# Patient Record
Sex: Female | Born: 1945 | Race: White | Hispanic: No | Marital: Married | Smoking: Never smoker
Health system: Southern US, Community
[De-identification: ages and names within clinical notes are randomized; demographics above are authoritative.]

## PROBLEM LIST (undated history)

## (undated) DIAGNOSIS — B019 Varicella without complication: Secondary | ICD-10-CM

## (undated) DIAGNOSIS — K635 Polyp of colon: Secondary | ICD-10-CM

## (undated) DIAGNOSIS — E039 Hypothyroidism, unspecified: Secondary | ICD-10-CM

## (undated) DIAGNOSIS — M810 Age-related osteoporosis without current pathological fracture: Secondary | ICD-10-CM

## (undated) DIAGNOSIS — F32A Depression, unspecified: Secondary | ICD-10-CM

## (undated) DIAGNOSIS — F329 Major depressive disorder, single episode, unspecified: Secondary | ICD-10-CM

## (undated) DIAGNOSIS — N390 Urinary tract infection, site not specified: Secondary | ICD-10-CM

## (undated) DIAGNOSIS — J45909 Unspecified asthma, uncomplicated: Secondary | ICD-10-CM

## (undated) DIAGNOSIS — Z9109 Other allergy status, other than to drugs and biological substances: Secondary | ICD-10-CM

## (undated) DIAGNOSIS — M1990 Unspecified osteoarthritis, unspecified site: Secondary | ICD-10-CM

## (undated) HISTORY — DX: Major depressive disorder, single episode, unspecified: F32.9

## (undated) HISTORY — DX: Hypothyroidism, unspecified: E03.9

## (undated) HISTORY — DX: Unspecified osteoarthritis, unspecified site: M19.90

## (undated) HISTORY — PX: TUBAL LIGATION: SHX77

## (undated) HISTORY — DX: Varicella without complication: B01.9

## (undated) HISTORY — DX: Depression, unspecified: F32.A

## (undated) HISTORY — DX: Urinary tract infection, site not specified: N39.0

## (undated) HISTORY — PX: LAMINECTOMY: SHX219

## (undated) HISTORY — DX: Polyp of colon: K63.5

---

## 2011-07-11 DIAGNOSIS — J019 Acute sinusitis, unspecified: Secondary | ICD-10-CM | POA: Diagnosis not present

## 2011-07-16 DIAGNOSIS — J301 Allergic rhinitis due to pollen: Secondary | ICD-10-CM | POA: Diagnosis not present

## 2011-07-18 DIAGNOSIS — J329 Chronic sinusitis, unspecified: Secondary | ICD-10-CM | POA: Diagnosis not present

## 2011-07-18 DIAGNOSIS — J309 Allergic rhinitis, unspecified: Secondary | ICD-10-CM | POA: Diagnosis not present

## 2011-07-23 DIAGNOSIS — J301 Allergic rhinitis due to pollen: Secondary | ICD-10-CM | POA: Diagnosis not present

## 2011-07-29 DIAGNOSIS — J329 Chronic sinusitis, unspecified: Secondary | ICD-10-CM | POA: Diagnosis not present

## 2011-07-29 DIAGNOSIS — J309 Allergic rhinitis, unspecified: Secondary | ICD-10-CM | POA: Diagnosis not present

## 2011-08-15 DIAGNOSIS — J309 Allergic rhinitis, unspecified: Secondary | ICD-10-CM | POA: Diagnosis not present

## 2011-08-29 ENCOUNTER — Ambulatory Visit
Admission: RE | Admit: 2011-08-29 | Discharge: 2011-08-29 | Disposition: A | Discharge status: Home / Self Care | Payer: Medicare Other | Source: Ambulatory Visit | Attending: Allergy and Immunology | Admitting: Allergy and Immunology

## 2011-08-29 ENCOUNTER — Other Ambulatory Visit: Payer: Self-pay | Admitting: Allergy and Immunology

## 2011-08-29 DIAGNOSIS — J3489 Other specified disorders of nose and nasal sinuses: Secondary | ICD-10-CM | POA: Diagnosis not present

## 2011-08-29 DIAGNOSIS — J301 Allergic rhinitis due to pollen: Secondary | ICD-10-CM | POA: Diagnosis not present

## 2011-08-29 DIAGNOSIS — J329 Chronic sinusitis, unspecified: Secondary | ICD-10-CM | POA: Diagnosis not present

## 2011-08-29 DIAGNOSIS — J019 Acute sinusitis, unspecified: Secondary | ICD-10-CM

## 2011-08-29 DIAGNOSIS — J3089 Other allergic rhinitis: Secondary | ICD-10-CM | POA: Diagnosis not present

## 2011-08-29 DIAGNOSIS — J3081 Allergic rhinitis due to animal (cat) (dog) hair and dander: Secondary | ICD-10-CM | POA: Diagnosis not present

## 2011-08-29 DIAGNOSIS — J45909 Unspecified asthma, uncomplicated: Secondary | ICD-10-CM | POA: Diagnosis not present

## 2011-09-01 DIAGNOSIS — J309 Allergic rhinitis, unspecified: Secondary | ICD-10-CM | POA: Diagnosis not present

## 2011-09-08 DIAGNOSIS — J019 Acute sinusitis, unspecified: Secondary | ICD-10-CM | POA: Diagnosis not present

## 2011-09-08 DIAGNOSIS — J45909 Unspecified asthma, uncomplicated: Secondary | ICD-10-CM | POA: Diagnosis not present

## 2011-09-08 DIAGNOSIS — J3089 Other allergic rhinitis: Secondary | ICD-10-CM | POA: Diagnosis not present

## 2011-09-08 DIAGNOSIS — J3081 Allergic rhinitis due to animal (cat) (dog) hair and dander: Secondary | ICD-10-CM | POA: Diagnosis not present

## 2011-09-08 DIAGNOSIS — J301 Allergic rhinitis due to pollen: Secondary | ICD-10-CM | POA: Diagnosis not present

## 2011-09-24 DIAGNOSIS — J309 Allergic rhinitis, unspecified: Secondary | ICD-10-CM | POA: Diagnosis not present

## 2011-09-26 DIAGNOSIS — J309 Allergic rhinitis, unspecified: Secondary | ICD-10-CM | POA: Diagnosis not present

## 2011-10-06 DIAGNOSIS — J309 Allergic rhinitis, unspecified: Secondary | ICD-10-CM | POA: Diagnosis not present

## 2011-10-08 DIAGNOSIS — J309 Allergic rhinitis, unspecified: Secondary | ICD-10-CM | POA: Diagnosis not present

## 2011-10-14 DIAGNOSIS — J309 Allergic rhinitis, unspecified: Secondary | ICD-10-CM | POA: Diagnosis not present

## 2011-10-17 DIAGNOSIS — J309 Allergic rhinitis, unspecified: Secondary | ICD-10-CM | POA: Diagnosis not present

## 2011-10-21 DIAGNOSIS — J309 Allergic rhinitis, unspecified: Secondary | ICD-10-CM | POA: Diagnosis not present

## 2011-10-24 DIAGNOSIS — J309 Allergic rhinitis, unspecified: Secondary | ICD-10-CM | POA: Diagnosis not present

## 2011-10-28 DIAGNOSIS — J309 Allergic rhinitis, unspecified: Secondary | ICD-10-CM | POA: Diagnosis not present

## 2011-10-31 DIAGNOSIS — J309 Allergic rhinitis, unspecified: Secondary | ICD-10-CM | POA: Diagnosis not present

## 2011-11-05 DIAGNOSIS — J309 Allergic rhinitis, unspecified: Secondary | ICD-10-CM | POA: Diagnosis not present

## 2011-11-07 DIAGNOSIS — J309 Allergic rhinitis, unspecified: Secondary | ICD-10-CM | POA: Diagnosis not present

## 2011-11-11 DIAGNOSIS — R6884 Jaw pain: Secondary | ICD-10-CM | POA: Diagnosis not present

## 2011-11-12 DIAGNOSIS — J309 Allergic rhinitis, unspecified: Secondary | ICD-10-CM | POA: Diagnosis not present

## 2011-11-14 DIAGNOSIS — J309 Allergic rhinitis, unspecified: Secondary | ICD-10-CM | POA: Diagnosis not present

## 2011-11-19 DIAGNOSIS — J309 Allergic rhinitis, unspecified: Secondary | ICD-10-CM | POA: Diagnosis not present

## 2011-11-21 DIAGNOSIS — J309 Allergic rhinitis, unspecified: Secondary | ICD-10-CM | POA: Diagnosis not present

## 2011-11-25 DIAGNOSIS — J309 Allergic rhinitis, unspecified: Secondary | ICD-10-CM | POA: Diagnosis not present

## 2011-11-28 DIAGNOSIS — Z136 Encounter for screening for cardiovascular disorders: Secondary | ICD-10-CM | POA: Diagnosis not present

## 2011-11-28 DIAGNOSIS — J309 Allergic rhinitis, unspecified: Secondary | ICD-10-CM | POA: Diagnosis not present

## 2011-12-03 DIAGNOSIS — J309 Allergic rhinitis, unspecified: Secondary | ICD-10-CM | POA: Diagnosis not present

## 2011-12-05 DIAGNOSIS — J309 Allergic rhinitis, unspecified: Secondary | ICD-10-CM | POA: Diagnosis not present

## 2011-12-09 ENCOUNTER — Other Ambulatory Visit: Payer: Self-pay | Admitting: Family Medicine

## 2011-12-09 DIAGNOSIS — Z0000 Encounter for general adult medical examination without abnormal findings: Secondary | ICD-10-CM | POA: Diagnosis not present

## 2011-12-09 DIAGNOSIS — M810 Age-related osteoporosis without current pathological fracture: Secondary | ICD-10-CM

## 2011-12-09 DIAGNOSIS — Z1231 Encounter for screening mammogram for malignant neoplasm of breast: Secondary | ICD-10-CM

## 2011-12-09 DIAGNOSIS — J309 Allergic rhinitis, unspecified: Secondary | ICD-10-CM | POA: Diagnosis not present

## 2011-12-12 DIAGNOSIS — J309 Allergic rhinitis, unspecified: Secondary | ICD-10-CM | POA: Diagnosis not present

## 2011-12-16 DIAGNOSIS — J309 Allergic rhinitis, unspecified: Secondary | ICD-10-CM | POA: Diagnosis not present

## 2011-12-17 ENCOUNTER — Other Ambulatory Visit (HOSPITAL_COMMUNITY)
Admission: RE | Admit: 2011-12-17 | Discharge: 2011-12-17 | Disposition: A | Discharge status: Home / Self Care | Payer: Medicare Other | Source: Ambulatory Visit | Attending: Family Medicine | Admitting: Family Medicine

## 2011-12-17 DIAGNOSIS — Z124 Encounter for screening for malignant neoplasm of cervix: Secondary | ICD-10-CM | POA: Diagnosis not present

## 2011-12-17 DIAGNOSIS — Z1159 Encounter for screening for other viral diseases: Secondary | ICD-10-CM | POA: Diagnosis not present

## 2011-12-17 DIAGNOSIS — N952 Postmenopausal atrophic vaginitis: Secondary | ICD-10-CM | POA: Diagnosis not present

## 2011-12-23 DIAGNOSIS — J309 Allergic rhinitis, unspecified: Secondary | ICD-10-CM | POA: Diagnosis not present

## 2011-12-30 DIAGNOSIS — J309 Allergic rhinitis, unspecified: Secondary | ICD-10-CM | POA: Diagnosis not present

## 2011-12-31 DIAGNOSIS — J309 Allergic rhinitis, unspecified: Secondary | ICD-10-CM | POA: Diagnosis not present

## 2012-01-06 DIAGNOSIS — J309 Allergic rhinitis, unspecified: Secondary | ICD-10-CM | POA: Diagnosis not present

## 2012-01-13 DIAGNOSIS — J309 Allergic rhinitis, unspecified: Secondary | ICD-10-CM | POA: Diagnosis not present

## 2012-01-20 DIAGNOSIS — J309 Allergic rhinitis, unspecified: Secondary | ICD-10-CM | POA: Diagnosis not present

## 2012-01-30 DIAGNOSIS — J309 Allergic rhinitis, unspecified: Secondary | ICD-10-CM | POA: Diagnosis not present

## 2012-02-05 DIAGNOSIS — J309 Allergic rhinitis, unspecified: Secondary | ICD-10-CM | POA: Diagnosis not present

## 2012-02-09 DIAGNOSIS — J309 Allergic rhinitis, unspecified: Secondary | ICD-10-CM | POA: Diagnosis not present

## 2012-02-11 ENCOUNTER — Other Ambulatory Visit: Payer: Medicare Other

## 2012-02-11 ENCOUNTER — Ambulatory Visit: Payer: Medicare Other

## 2012-02-11 DIAGNOSIS — J309 Allergic rhinitis, unspecified: Secondary | ICD-10-CM | POA: Diagnosis not present

## 2012-02-17 DIAGNOSIS — J309 Allergic rhinitis, unspecified: Secondary | ICD-10-CM | POA: Diagnosis not present

## 2012-02-18 DIAGNOSIS — Z1211 Encounter for screening for malignant neoplasm of colon: Secondary | ICD-10-CM | POA: Diagnosis not present

## 2012-02-18 DIAGNOSIS — D126 Benign neoplasm of colon, unspecified: Secondary | ICD-10-CM | POA: Diagnosis not present

## 2012-02-20 DIAGNOSIS — J309 Allergic rhinitis, unspecified: Secondary | ICD-10-CM | POA: Diagnosis not present

## 2012-02-23 DIAGNOSIS — J019 Acute sinusitis, unspecified: Secondary | ICD-10-CM | POA: Diagnosis not present

## 2012-02-25 ENCOUNTER — Ambulatory Visit
Admission: RE | Admit: 2012-02-25 | Discharge: 2012-02-25 | Disposition: A | Discharge status: Home / Self Care | Payer: Medicare Other | Source: Ambulatory Visit | Attending: Family Medicine | Admitting: Family Medicine

## 2012-02-25 DIAGNOSIS — Z780 Asymptomatic menopausal state: Secondary | ICD-10-CM | POA: Diagnosis not present

## 2012-02-25 DIAGNOSIS — Z1231 Encounter for screening mammogram for malignant neoplasm of breast: Secondary | ICD-10-CM

## 2012-02-25 DIAGNOSIS — M810 Age-related osteoporosis without current pathological fracture: Secondary | ICD-10-CM | POA: Diagnosis not present

## 2012-03-01 DIAGNOSIS — J309 Allergic rhinitis, unspecified: Secondary | ICD-10-CM | POA: Diagnosis not present

## 2012-03-05 DIAGNOSIS — J309 Allergic rhinitis, unspecified: Secondary | ICD-10-CM | POA: Diagnosis not present

## 2012-03-09 DIAGNOSIS — J309 Allergic rhinitis, unspecified: Secondary | ICD-10-CM | POA: Diagnosis not present

## 2012-03-15 DIAGNOSIS — J019 Acute sinusitis, unspecified: Secondary | ICD-10-CM | POA: Diagnosis not present

## 2012-03-15 DIAGNOSIS — J3081 Allergic rhinitis due to animal (cat) (dog) hair and dander: Secondary | ICD-10-CM | POA: Diagnosis not present

## 2012-03-15 DIAGNOSIS — J45909 Unspecified asthma, uncomplicated: Secondary | ICD-10-CM | POA: Diagnosis not present

## 2012-03-15 DIAGNOSIS — J3089 Other allergic rhinitis: Secondary | ICD-10-CM | POA: Diagnosis not present

## 2012-03-15 DIAGNOSIS — J301 Allergic rhinitis due to pollen: Secondary | ICD-10-CM | POA: Diagnosis not present

## 2012-03-23 DIAGNOSIS — J309 Allergic rhinitis, unspecified: Secondary | ICD-10-CM | POA: Diagnosis not present

## 2012-03-31 DIAGNOSIS — J309 Allergic rhinitis, unspecified: Secondary | ICD-10-CM | POA: Diagnosis not present

## 2012-04-07 DIAGNOSIS — J309 Allergic rhinitis, unspecified: Secondary | ICD-10-CM | POA: Diagnosis not present

## 2012-04-13 DIAGNOSIS — L03019 Cellulitis of unspecified finger: Secondary | ICD-10-CM | POA: Diagnosis not present

## 2012-04-13 DIAGNOSIS — Z23 Encounter for immunization: Secondary | ICD-10-CM | POA: Diagnosis not present

## 2012-04-19 DIAGNOSIS — J069 Acute upper respiratory infection, unspecified: Secondary | ICD-10-CM | POA: Diagnosis not present

## 2012-04-19 DIAGNOSIS — J301 Allergic rhinitis due to pollen: Secondary | ICD-10-CM | POA: Diagnosis not present

## 2012-04-19 DIAGNOSIS — J3081 Allergic rhinitis due to animal (cat) (dog) hair and dander: Secondary | ICD-10-CM | POA: Diagnosis not present

## 2012-04-19 DIAGNOSIS — J3089 Other allergic rhinitis: Secondary | ICD-10-CM | POA: Diagnosis not present

## 2012-04-19 DIAGNOSIS — J45909 Unspecified asthma, uncomplicated: Secondary | ICD-10-CM | POA: Diagnosis not present

## 2012-04-27 DIAGNOSIS — J309 Allergic rhinitis, unspecified: Secondary | ICD-10-CM | POA: Diagnosis not present

## 2012-05-05 DIAGNOSIS — J309 Allergic rhinitis, unspecified: Secondary | ICD-10-CM | POA: Diagnosis not present

## 2012-05-12 DIAGNOSIS — J309 Allergic rhinitis, unspecified: Secondary | ICD-10-CM | POA: Diagnosis not present

## 2012-05-19 DIAGNOSIS — J309 Allergic rhinitis, unspecified: Secondary | ICD-10-CM | POA: Diagnosis not present

## 2012-05-20 DIAGNOSIS — J309 Allergic rhinitis, unspecified: Secondary | ICD-10-CM | POA: Diagnosis not present

## 2012-05-26 DIAGNOSIS — J309 Allergic rhinitis, unspecified: Secondary | ICD-10-CM | POA: Diagnosis not present

## 2012-06-02 DIAGNOSIS — J309 Allergic rhinitis, unspecified: Secondary | ICD-10-CM | POA: Diagnosis not present

## 2012-06-09 DIAGNOSIS — J309 Allergic rhinitis, unspecified: Secondary | ICD-10-CM | POA: Diagnosis not present

## 2012-06-09 DIAGNOSIS — E039 Hypothyroidism, unspecified: Secondary | ICD-10-CM | POA: Diagnosis not present

## 2012-06-09 DIAGNOSIS — M810 Age-related osteoporosis without current pathological fracture: Secondary | ICD-10-CM | POA: Diagnosis not present

## 2012-06-09 DIAGNOSIS — K1370 Unspecified lesions of oral mucosa: Secondary | ICD-10-CM | POA: Diagnosis not present

## 2012-06-09 DIAGNOSIS — F329 Major depressive disorder, single episode, unspecified: Secondary | ICD-10-CM | POA: Diagnosis not present

## 2012-06-16 DIAGNOSIS — J309 Allergic rhinitis, unspecified: Secondary | ICD-10-CM | POA: Diagnosis not present

## 2012-06-21 DIAGNOSIS — J309 Allergic rhinitis, unspecified: Secondary | ICD-10-CM | POA: Diagnosis not present

## 2012-06-24 DIAGNOSIS — M810 Age-related osteoporosis without current pathological fracture: Secondary | ICD-10-CM | POA: Diagnosis not present

## 2012-06-24 DIAGNOSIS — J309 Allergic rhinitis, unspecified: Secondary | ICD-10-CM | POA: Diagnosis not present

## 2012-06-28 DIAGNOSIS — J309 Allergic rhinitis, unspecified: Secondary | ICD-10-CM | POA: Diagnosis not present

## 2012-07-01 DIAGNOSIS — J309 Allergic rhinitis, unspecified: Secondary | ICD-10-CM | POA: Diagnosis not present

## 2012-07-05 DIAGNOSIS — J309 Allergic rhinitis, unspecified: Secondary | ICD-10-CM | POA: Diagnosis not present

## 2012-07-12 DIAGNOSIS — J3081 Allergic rhinitis due to animal (cat) (dog) hair and dander: Secondary | ICD-10-CM | POA: Diagnosis not present

## 2012-07-12 DIAGNOSIS — J309 Allergic rhinitis, unspecified: Secondary | ICD-10-CM | POA: Diagnosis not present

## 2012-07-12 DIAGNOSIS — J301 Allergic rhinitis due to pollen: Secondary | ICD-10-CM | POA: Diagnosis not present

## 2012-07-12 DIAGNOSIS — J3089 Other allergic rhinitis: Secondary | ICD-10-CM | POA: Diagnosis not present

## 2012-07-12 DIAGNOSIS — J45909 Unspecified asthma, uncomplicated: Secondary | ICD-10-CM | POA: Diagnosis not present

## 2012-07-14 ENCOUNTER — Encounter (HOSPITAL_COMMUNITY): Payer: Medicare Other

## 2012-07-14 ENCOUNTER — Other Ambulatory Visit (HOSPITAL_COMMUNITY): Payer: Self-pay | Admitting: Family Medicine

## 2012-07-15 DIAGNOSIS — M545 Low back pain: Secondary | ICD-10-CM | POA: Diagnosis not present

## 2012-07-16 ENCOUNTER — Encounter (HOSPITAL_COMMUNITY): Payer: Self-pay

## 2012-07-16 ENCOUNTER — Encounter (HOSPITAL_COMMUNITY)
Admission: RE | Admit: 2012-07-16 | Discharge: 2012-07-16 | Disposition: A | Discharge status: Home / Self Care | Payer: Medicare Other | Source: Ambulatory Visit | Attending: Family Medicine | Admitting: Family Medicine

## 2012-07-16 DIAGNOSIS — M810 Age-related osteoporosis without current pathological fracture: Secondary | ICD-10-CM | POA: Diagnosis not present

## 2012-07-16 HISTORY — DX: Age-related osteoporosis without current pathological fracture: M81.0

## 2012-07-16 HISTORY — DX: Unspecified asthma, uncomplicated: J45.909

## 2012-07-16 HISTORY — DX: Other allergy status, other than to drugs and biological substances: Z91.09

## 2012-07-16 MED ORDER — ZOLEDRONIC ACID 5 MG/100ML IV SOLN
5.0000 mg | Freq: Once | INTRAVENOUS | Status: AC
  Administered 2012-07-16: 5 mg via INTRAVENOUS
  Filled 2012-07-16: qty 100

## 2012-07-16 MED ORDER — SODIUM CHLORIDE 0.9 % IV SOLN
INTRAVENOUS | Status: AC
  Administered 2012-07-16: 17:00:00 via INTRAVENOUS

## 2012-07-19 DIAGNOSIS — J309 Allergic rhinitis, unspecified: Secondary | ICD-10-CM | POA: Diagnosis not present

## 2012-07-19 DIAGNOSIS — H2510 Age-related nuclear cataract, unspecified eye: Secondary | ICD-10-CM | POA: Diagnosis not present

## 2012-07-19 DIAGNOSIS — H52209 Unspecified astigmatism, unspecified eye: Secondary | ICD-10-CM | POA: Diagnosis not present

## 2012-07-19 DIAGNOSIS — H5210 Myopia, unspecified eye: Secondary | ICD-10-CM | POA: Diagnosis not present

## 2012-07-26 DIAGNOSIS — J309 Allergic rhinitis, unspecified: Secondary | ICD-10-CM | POA: Diagnosis not present

## 2012-08-02 DIAGNOSIS — J309 Allergic rhinitis, unspecified: Secondary | ICD-10-CM | POA: Diagnosis not present

## 2012-08-05 DIAGNOSIS — J309 Allergic rhinitis, unspecified: Secondary | ICD-10-CM | POA: Diagnosis not present

## 2012-08-09 DIAGNOSIS — J309 Allergic rhinitis, unspecified: Secondary | ICD-10-CM | POA: Diagnosis not present

## 2012-08-10 DIAGNOSIS — H52209 Unspecified astigmatism, unspecified eye: Secondary | ICD-10-CM | POA: Diagnosis not present

## 2012-08-10 DIAGNOSIS — H2510 Age-related nuclear cataract, unspecified eye: Secondary | ICD-10-CM | POA: Diagnosis not present

## 2012-08-10 DIAGNOSIS — H43819 Vitreous degeneration, unspecified eye: Secondary | ICD-10-CM | POA: Diagnosis not present

## 2012-08-12 DIAGNOSIS — J309 Allergic rhinitis, unspecified: Secondary | ICD-10-CM | POA: Diagnosis not present

## 2012-08-16 DIAGNOSIS — J309 Allergic rhinitis, unspecified: Secondary | ICD-10-CM | POA: Diagnosis not present

## 2012-08-23 DIAGNOSIS — J309 Allergic rhinitis, unspecified: Secondary | ICD-10-CM | POA: Diagnosis not present

## 2012-08-31 DIAGNOSIS — J309 Allergic rhinitis, unspecified: Secondary | ICD-10-CM | POA: Diagnosis not present

## 2012-09-02 DIAGNOSIS — S298XXA Other specified injuries of thorax, initial encounter: Secondary | ICD-10-CM | POA: Diagnosis not present

## 2012-09-03 ENCOUNTER — Emergency Department (HOSPITAL_COMMUNITY)
Admission: EM | Admit: 2012-09-03 | Discharge: 2012-09-03 | Disposition: A | Discharge status: Home / Self Care | Payer: Medicare Other | Attending: Emergency Medicine | Admitting: Emergency Medicine

## 2012-09-03 ENCOUNTER — Emergency Department (HOSPITAL_COMMUNITY): Payer: Medicare Other

## 2012-09-03 ENCOUNTER — Encounter (HOSPITAL_COMMUNITY): Payer: Self-pay | Admitting: Emergency Medicine

## 2012-09-03 DIAGNOSIS — J45909 Unspecified asthma, uncomplicated: Secondary | ICD-10-CM | POA: Diagnosis not present

## 2012-09-03 DIAGNOSIS — R55 Syncope and collapse: Secondary | ICD-10-CM | POA: Insufficient documentation

## 2012-09-03 DIAGNOSIS — Z79899 Other long term (current) drug therapy: Secondary | ICD-10-CM | POA: Diagnosis not present

## 2012-09-03 DIAGNOSIS — R0789 Other chest pain: Secondary | ICD-10-CM | POA: Insufficient documentation

## 2012-09-03 DIAGNOSIS — R0781 Pleurodynia: Secondary | ICD-10-CM

## 2012-09-03 DIAGNOSIS — R0602 Shortness of breath: Secondary | ICD-10-CM | POA: Insufficient documentation

## 2012-09-03 DIAGNOSIS — R42 Dizziness and giddiness: Secondary | ICD-10-CM | POA: Diagnosis not present

## 2012-09-03 DIAGNOSIS — IMO0002 Reserved for concepts with insufficient information to code with codable children: Secondary | ICD-10-CM | POA: Insufficient documentation

## 2012-09-03 DIAGNOSIS — M810 Age-related osteoporosis without current pathological fracture: Secondary | ICD-10-CM | POA: Diagnosis not present

## 2012-09-03 DIAGNOSIS — R079 Chest pain, unspecified: Secondary | ICD-10-CM | POA: Diagnosis not present

## 2012-09-03 LAB — BASIC METABOLIC PANEL
CO2: 22 mEq/L (ref 19–32)
Calcium: 9.5 mg/dL (ref 8.4–10.5)
GFR calc Af Amer: >90 mL/min (ref >90)
GFR calc non Af Amer: 84 mL/min — ABNORMAL LOW (ref >90)
Sodium: 139 mEq/L (ref 135–145)

## 2012-09-03 LAB — CBC
MCH: 31.3 pg (ref 26.0–34.0)
MCHC: 33.6 g/dL (ref 30.0–36.0)
Platelets: 276 10*3/uL (ref 150–400)
RBC: 4.35 MIL/uL (ref 3.87–5.11)

## 2012-09-03 MED ORDER — HYDROCODONE-ACETAMINOPHEN 5-325 MG PO TABS: 1.0000 | ORAL_TABLET | Freq: Four times a day (QID) | ORAL | Status: DC | PRN

## 2012-09-03 MED ORDER — SODIUM CHLORIDE 0.9 % IV BOLUS (SEPSIS): 2000.0000 mL | Freq: Once | INTRAVENOUS | Status: DC

## 2012-09-03 MED ORDER — HYDROMORPHONE HCL PF 1 MG/ML IJ SOLN
0.5000 mg | Freq: Once | INTRAMUSCULAR | Status: AC
  Administered 2012-09-03: 13:00:00 via INTRAVENOUS
  Filled 2012-09-03: qty 1

## 2012-09-03 NOTE — ED Provider Notes (Signed)
Medical screening examination/treatment/procedure(s) were conducted as a shared visit with non-physician practitioner(s) and myself.  I personally evaluated the patient during the encounter   Benny Lennert, MD 09/03/12 [phone]

## 2012-09-03 NOTE — ED Provider Notes (Signed)
History     CSN: [id number]  Arrival date & time 09/03/12  1115   First MD Initiated Contact with Patient 09/03/12 1130      Chief Complaint  Patient presents with  . left rib pain     (Consider location/radiation/quality/duration/timing/severity/associated sxs/prior treatment) Patient is a 67 y.o. female presenting with chest pain. The history is provided by the patient.  Chest Pain Pain location:  L lateral chest Pain quality: sharp   Radiates to: radiates around side  Pain severity:  Severe Onset quality:  Sudden Duration:  3 days Timing:  Constant Progression:  Worsening Chronicity:  New Context: trauma   Context: not at rest   Relieved by: rest. Worsened by:  Movement and deep breathing Associated symptoms: dizziness, near-syncope and shortness of breath   Associated symptoms: no abdominal pain and no fever   Shortness of breath:    Severity:  Moderate   Onset quality:  Sudden   Duration:  2 hours   Timing:  Constant   Progression:  Worsening Risk factors comment:  Osetoporosis, hx of rib fx  Patient was gardening Wednesday (2 days ago) when her shovel hit her in her left lateral chest wall. She felt ok, but was not seen. Yesterday she felt more pain and went to her PCP where her xray did not show a fracture. She went home with instructions to take motrin and ice it. She was compliant. This morning she woke up at 9am and was light headed with SOB. She went to her PCP again this morning. An xray and ecg were done. Both of which were negative. PCP sent her to ER by car with husband driving with instructions that she needed a CT.   Past Medical History  Diagnosis Date  . Osteoporosis   . Environmental allergies     allergies all year long  . Asthma     Past Surgical History  Procedure Laterality Date  . Tubal ligation      No family history on file.  History  Substance Use Topics  . Smoking status: Never Smoker   . Smokeless tobacco: Never Used  .  Alcohol Use: 4.2 oz/week    7 Glasses of wine per week     Comment: a glass of wine with dinner each night    OB History   Grav Para Term Preterm Abortions TAB SAB Ect Mult Living                  Review of Systems  Constitutional: Negative for fever.  Respiratory: Positive for shortness of breath.   Cardiovascular: Positive for chest pain and near-syncope.  Gastrointestinal: Negative for abdominal pain.  Neurological: Positive for dizziness.    Allergies  Augmentin and Bactrim  Home Medications   Current Outpatient Rx  Name  Route  Sig  Dispense  Refill  . albuterol (PROVENTIL HFA;VENTOLIN HFA) 108 (90 BASE) MCG/ACT inhaler   Inhalation   Inhale 2 puffs into the lungs every 6 (six) hours as needed for wheezing or shortness of breath.         . beclomethasone (QVAR) 40 MCG/ACT inhaler   Inhalation   Inhale 2 puffs into the lungs 2 (two) times daily.         Marland Kitchen buPROPion (WELLBUTRIN XL) 300 MG 24 hr tablet   Oral   Take 300 mg by mouth every morning.         . calcium-vitamin D (OSCAL-500) 500-400 MG-UNIT per tablet   Oral  Take 1 tablet by mouth 2 (two) times daily with a meal.         . cetirizine (ZYRTEC) 10 MG tablet   Oral   Take 10 mg by mouth every morning.         . diphenhydrAMINE (BENADRYL) 25 MG tablet   Oral   Take 25 mg by mouth every 6 (six) hours as needed for allergies.         Marland Kitchen docusate sodium (COLACE) 100 MG capsule   Oral   Take 100 mg by mouth at bedtime.         . fexofenadine (ALLER-EASE) 180 MG tablet   Oral   Take 180 mg by mouth every morning.         Marland Kitchen FLUoxetine (PROZAC) 40 MG capsule   Oral   Take 40 mg by mouth every morning.         Marland Kitchen ibuprofen (ADVIL,MOTRIN) 200 MG tablet   Oral   Take 200-800 mg by mouth every 6 (six) hours as needed for pain.         Marland Kitchen levothyroxine (SYNTHROID, LEVOTHROID) 125 MCG tablet   Oral   Take 125 mcg by mouth daily before breakfast.         . montelukast (SINGULAIR)  10 MG tablet   Oral   Take 10 mg by mouth at bedtime.         . Multiple Vitamin (MULTIVITAMIN WITH MINERALS) TABS   Oral   Take 1 tablet by mouth every morning.         Marland Kitchen olopatadine (PATANOL) 0.1 % ophthalmic solution   Both Eyes   Place 1 drop into both eyes 2 (two) times daily.           BP 136/71  Pulse 59  Temp(Src) 98.4 F (36.9 C) (Oral)  Resp 16  SpO2 100%  Physical Exam  Nursing note and vitals reviewed. Constitutional: She appears well-developed.  Non-toxic appearance.  Triage BP hypotensive - likely an error; BP 3 minutes later was normotensive  HENT:  Head: Normocephalic and atraumatic.  Right Ear: External ear normal.  Left Ear: External ear normal.  Nose: Nose normal.  Eyes: Conjunctivae, EOM and lids are normal.  Neck: Trachea normal and phonation normal. No tracheal deviation present.  Cardiovascular: Normal rate, regular rhythm, S1 normal, S2 normal, normal heart sounds, intact distal pulses and normal pulses.  Exam reveals no gallop and no friction rub.   No murmur heard. Pulmonary/Chest: Effort normal and breath sounds normal. No respiratory distress. She has no wheezes. She has no rales. She exhibits tenderness.  Tenderness over left lateral wall consistent with rib distrubution   Abdominal: Soft.  Musculoskeletal: Normal range of motion. She exhibits tenderness.  Neurological: She is alert.  Skin: Skin is warm and dry.  Psychiatric: She has a normal mood and affect. Her behavior is normal.    ED Course  Procedures (including critical care time)  Labs Reviewed  BASIC METABOLIC PANEL - Abnormal; Notable for the following:    Glucose, Bld 113 (*)    GFR calc non Af Amer 84 (*)    All other components within normal limits  CBC   Dg Chest 2 View  09/03/2012  *RADIOLOGY REPORT*  Clinical Data: Shortness of breath, chest pain  CHEST - 2 VIEW  Comparison: [facility] chest radiographs dated02/28/2014  Findings: Lungs are essentially  clear.  Mild linear scarring in the left upper lobe.  No pleural effusion  or pneumothorax.  Prior nodular opacities overlying the lower lungs are not visualized on the current study and likely corresponded to the patient's nipples.  The heart is normal in size.  Mild degenerative changes of the visualized thoracolumbar spine.  IMPRESSION: No evidence of acute cardiopulmonary disease.   Original Report Authenticated By: Charline Bills, M.D.     Date: 09/03/2012  Rate: 60  Rhythm: normal sinus rhythm  QRS Axis: left  Intervals: normal  ST/T Wave abnormalities: normal  Conduction Disutrbances:none  Narrative Interpretation:   Old EKG Reviewed: none available   1. Rib pain on left side   2. Chest pain, musculoskeletal       MDM  Patient is stable. Pain only with movement. At this point suspect rib fx that is not visible on chest xray. No positive risk factors for PE. No concern for cardiac causes of pain at this point. Patient encouraged to take deep breaths for prevention of atelectasis and pneumonia. Patient given pain medicine. Dr. Estell Harpin saw patient and agrees with plan. Patient voices understanding of the plan and agrees. Return instructions given.   Mora Bellman, PA-C 09/03/12 1529

## 2012-09-03 NOTE — ED Notes (Signed)
Per patient was digging hole to plant tree and handle of shovel jared her left side

## 2012-09-06 DIAGNOSIS — J309 Allergic rhinitis, unspecified: Secondary | ICD-10-CM | POA: Diagnosis not present

## 2012-09-09 DIAGNOSIS — H2510 Age-related nuclear cataract, unspecified eye: Secondary | ICD-10-CM | POA: Diagnosis not present

## 2012-09-09 DIAGNOSIS — H2589 Other age-related cataract: Secondary | ICD-10-CM | POA: Diagnosis not present

## 2012-09-14 DIAGNOSIS — J309 Allergic rhinitis, unspecified: Secondary | ICD-10-CM | POA: Diagnosis not present

## 2012-09-15 DIAGNOSIS — J3081 Allergic rhinitis due to animal (cat) (dog) hair and dander: Secondary | ICD-10-CM | POA: Diagnosis not present

## 2012-09-15 DIAGNOSIS — J301 Allergic rhinitis due to pollen: Secondary | ICD-10-CM | POA: Diagnosis not present

## 2012-09-15 DIAGNOSIS — J019 Acute sinusitis, unspecified: Secondary | ICD-10-CM | POA: Diagnosis not present

## 2012-09-15 DIAGNOSIS — J45909 Unspecified asthma, uncomplicated: Secondary | ICD-10-CM | POA: Diagnosis not present

## 2012-09-15 DIAGNOSIS — J3089 Other allergic rhinitis: Secondary | ICD-10-CM | POA: Diagnosis not present

## 2012-09-15 DIAGNOSIS — R05 Cough: Secondary | ICD-10-CM | POA: Diagnosis not present

## 2012-09-23 DIAGNOSIS — J309 Allergic rhinitis, unspecified: Secondary | ICD-10-CM | POA: Diagnosis not present

## 2012-09-30 DIAGNOSIS — J309 Allergic rhinitis, unspecified: Secondary | ICD-10-CM | POA: Diagnosis not present

## 2012-10-04 DIAGNOSIS — R05 Cough: Secondary | ICD-10-CM | POA: Diagnosis not present

## 2012-10-04 DIAGNOSIS — J301 Allergic rhinitis due to pollen: Secondary | ICD-10-CM | POA: Diagnosis not present

## 2012-10-04 DIAGNOSIS — J3081 Allergic rhinitis due to animal (cat) (dog) hair and dander: Secondary | ICD-10-CM | POA: Diagnosis not present

## 2012-10-04 DIAGNOSIS — J45909 Unspecified asthma, uncomplicated: Secondary | ICD-10-CM | POA: Diagnosis not present

## 2012-10-04 DIAGNOSIS — J3089 Other allergic rhinitis: Secondary | ICD-10-CM | POA: Diagnosis not present

## 2012-10-08 DIAGNOSIS — J309 Allergic rhinitis, unspecified: Secondary | ICD-10-CM | POA: Diagnosis not present

## 2012-10-15 DIAGNOSIS — J309 Allergic rhinitis, unspecified: Secondary | ICD-10-CM | POA: Diagnosis not present

## 2012-10-21 DIAGNOSIS — J309 Allergic rhinitis, unspecified: Secondary | ICD-10-CM | POA: Diagnosis not present

## 2012-10-25 DIAGNOSIS — J309 Allergic rhinitis, unspecified: Secondary | ICD-10-CM | POA: Diagnosis not present

## 2012-10-29 DIAGNOSIS — J309 Allergic rhinitis, unspecified: Secondary | ICD-10-CM | POA: Diagnosis not present

## 2012-11-04 DIAGNOSIS — J309 Allergic rhinitis, unspecified: Secondary | ICD-10-CM | POA: Diagnosis not present

## 2012-11-11 DIAGNOSIS — J309 Allergic rhinitis, unspecified: Secondary | ICD-10-CM | POA: Diagnosis not present

## 2012-11-19 DIAGNOSIS — J309 Allergic rhinitis, unspecified: Secondary | ICD-10-CM | POA: Diagnosis not present

## 2012-11-24 DIAGNOSIS — J309 Allergic rhinitis, unspecified: Secondary | ICD-10-CM | POA: Diagnosis not present

## 2012-11-25 DIAGNOSIS — J3089 Other allergic rhinitis: Secondary | ICD-10-CM | POA: Diagnosis not present

## 2012-11-25 DIAGNOSIS — J3081 Allergic rhinitis due to animal (cat) (dog) hair and dander: Secondary | ICD-10-CM | POA: Diagnosis not present

## 2012-11-25 DIAGNOSIS — J45909 Unspecified asthma, uncomplicated: Secondary | ICD-10-CM | POA: Diagnosis not present

## 2012-11-25 DIAGNOSIS — J301 Allergic rhinitis due to pollen: Secondary | ICD-10-CM | POA: Diagnosis not present

## 2012-11-25 DIAGNOSIS — J309 Allergic rhinitis, unspecified: Secondary | ICD-10-CM | POA: Diagnosis not present

## 2012-11-25 DIAGNOSIS — J449 Chronic obstructive pulmonary disease, unspecified: Secondary | ICD-10-CM | POA: Diagnosis not present

## 2012-12-03 DIAGNOSIS — J45909 Unspecified asthma, uncomplicated: Secondary | ICD-10-CM | POA: Diagnosis not present

## 2012-12-03 DIAGNOSIS — J449 Chronic obstructive pulmonary disease, unspecified: Secondary | ICD-10-CM | POA: Diagnosis not present

## 2012-12-03 DIAGNOSIS — J3089 Other allergic rhinitis: Secondary | ICD-10-CM | POA: Diagnosis not present

## 2012-12-03 DIAGNOSIS — J301 Allergic rhinitis due to pollen: Secondary | ICD-10-CM | POA: Diagnosis not present

## 2012-12-03 DIAGNOSIS — J3081 Allergic rhinitis due to animal (cat) (dog) hair and dander: Secondary | ICD-10-CM | POA: Diagnosis not present

## 2012-12-06 ENCOUNTER — Ambulatory Visit
Admission: RE | Admit: 2012-12-06 | Discharge: 2012-12-06 | Disposition: A | Discharge status: Home / Self Care | Payer: Medicare Other | Source: Ambulatory Visit | Attending: Allergy and Immunology | Admitting: Allergy and Immunology

## 2012-12-06 ENCOUNTER — Other Ambulatory Visit: Payer: Self-pay | Admitting: Allergy and Immunology

## 2012-12-06 DIAGNOSIS — R05 Cough: Secondary | ICD-10-CM | POA: Diagnosis not present

## 2012-12-06 DIAGNOSIS — J449 Chronic obstructive pulmonary disease, unspecified: Secondary | ICD-10-CM

## 2012-12-10 DIAGNOSIS — J189 Pneumonia, unspecified organism: Secondary | ICD-10-CM | POA: Diagnosis not present

## 2012-12-21 DIAGNOSIS — J309 Allergic rhinitis, unspecified: Secondary | ICD-10-CM | POA: Diagnosis not present

## 2012-12-31 DIAGNOSIS — J309 Allergic rhinitis, unspecified: Secondary | ICD-10-CM | POA: Diagnosis not present

## 2013-01-06 DIAGNOSIS — J301 Allergic rhinitis due to pollen: Secondary | ICD-10-CM | POA: Diagnosis not present

## 2013-01-06 DIAGNOSIS — J309 Allergic rhinitis, unspecified: Secondary | ICD-10-CM | POA: Diagnosis not present

## 2013-01-06 DIAGNOSIS — J3089 Other allergic rhinitis: Secondary | ICD-10-CM | POA: Diagnosis not present

## 2013-01-06 DIAGNOSIS — J3081 Allergic rhinitis due to animal (cat) (dog) hair and dander: Secondary | ICD-10-CM | POA: Diagnosis not present

## 2013-01-06 DIAGNOSIS — J180 Bronchopneumonia, unspecified organism: Secondary | ICD-10-CM | POA: Diagnosis not present

## 2013-01-06 DIAGNOSIS — J45909 Unspecified asthma, uncomplicated: Secondary | ICD-10-CM | POA: Diagnosis not present

## 2013-01-10 DIAGNOSIS — J189 Pneumonia, unspecified organism: Secondary | ICD-10-CM | POA: Diagnosis not present

## 2013-01-13 DIAGNOSIS — J309 Allergic rhinitis, unspecified: Secondary | ICD-10-CM | POA: Diagnosis not present

## 2013-01-19 DIAGNOSIS — Z23 Encounter for immunization: Secondary | ICD-10-CM | POA: Diagnosis not present

## 2013-01-21 DIAGNOSIS — J309 Allergic rhinitis, unspecified: Secondary | ICD-10-CM | POA: Diagnosis not present

## 2013-01-27 DIAGNOSIS — J309 Allergic rhinitis, unspecified: Secondary | ICD-10-CM | POA: Diagnosis not present

## 2013-02-04 DIAGNOSIS — J309 Allergic rhinitis, unspecified: Secondary | ICD-10-CM | POA: Diagnosis not present

## 2013-02-07 DIAGNOSIS — J309 Allergic rhinitis, unspecified: Secondary | ICD-10-CM | POA: Diagnosis not present

## 2013-02-09 DIAGNOSIS — J309 Allergic rhinitis, unspecified: Secondary | ICD-10-CM | POA: Diagnosis not present

## 2013-02-22 DIAGNOSIS — J309 Allergic rhinitis, unspecified: Secondary | ICD-10-CM | POA: Diagnosis not present

## 2013-02-25 DIAGNOSIS — J328 Other chronic sinusitis: Secondary | ICD-10-CM | POA: Diagnosis not present

## 2013-03-02 DIAGNOSIS — J309 Allergic rhinitis, unspecified: Secondary | ICD-10-CM | POA: Diagnosis not present

## 2013-03-09 DIAGNOSIS — J309 Allergic rhinitis, unspecified: Secondary | ICD-10-CM | POA: Diagnosis not present

## 2013-03-16 DIAGNOSIS — J309 Allergic rhinitis, unspecified: Secondary | ICD-10-CM | POA: Diagnosis not present

## 2013-03-24 DIAGNOSIS — J309 Allergic rhinitis, unspecified: Secondary | ICD-10-CM | POA: Diagnosis not present

## 2013-03-31 DIAGNOSIS — J309 Allergic rhinitis, unspecified: Secondary | ICD-10-CM | POA: Diagnosis not present

## 2013-04-08 DIAGNOSIS — J309 Allergic rhinitis, unspecified: Secondary | ICD-10-CM | POA: Diagnosis not present

## 2013-04-15 DIAGNOSIS — J309 Allergic rhinitis, unspecified: Secondary | ICD-10-CM | POA: Diagnosis not present

## 2013-04-22 DIAGNOSIS — J309 Allergic rhinitis, unspecified: Secondary | ICD-10-CM | POA: Diagnosis not present

## 2013-04-29 DIAGNOSIS — J309 Allergic rhinitis, unspecified: Secondary | ICD-10-CM | POA: Diagnosis not present

## 2013-05-02 DIAGNOSIS — J309 Allergic rhinitis, unspecified: Secondary | ICD-10-CM | POA: Diagnosis not present

## 2013-05-06 DIAGNOSIS — J3081 Allergic rhinitis due to animal (cat) (dog) hair and dander: Secondary | ICD-10-CM | POA: Diagnosis not present

## 2013-05-06 DIAGNOSIS — J3089 Other allergic rhinitis: Secondary | ICD-10-CM | POA: Diagnosis not present

## 2013-05-06 DIAGNOSIS — J209 Acute bronchitis, unspecified: Secondary | ICD-10-CM | POA: Diagnosis not present

## 2013-05-06 DIAGNOSIS — J301 Allergic rhinitis due to pollen: Secondary | ICD-10-CM | POA: Diagnosis not present

## 2013-05-06 DIAGNOSIS — J45909 Unspecified asthma, uncomplicated: Secondary | ICD-10-CM | POA: Diagnosis not present

## 2013-06-21 DIAGNOSIS — J309 Allergic rhinitis, unspecified: Secondary | ICD-10-CM | POA: Diagnosis not present

## 2013-06-23 DIAGNOSIS — J309 Allergic rhinitis, unspecified: Secondary | ICD-10-CM | POA: Diagnosis not present

## 2013-06-27 DIAGNOSIS — J309 Allergic rhinitis, unspecified: Secondary | ICD-10-CM | POA: Diagnosis not present

## 2013-07-05 DIAGNOSIS — J309 Allergic rhinitis, unspecified: Secondary | ICD-10-CM | POA: Diagnosis not present

## 2013-07-07 HISTORY — PX: LUMBAR FUSION: SHX111

## 2013-07-08 DIAGNOSIS — J309 Allergic rhinitis, unspecified: Secondary | ICD-10-CM | POA: Diagnosis not present

## 2013-07-12 DIAGNOSIS — Z23 Encounter for immunization: Secondary | ICD-10-CM | POA: Diagnosis not present

## 2013-07-13 DIAGNOSIS — H52209 Unspecified astigmatism, unspecified eye: Secondary | ICD-10-CM | POA: Diagnosis not present

## 2013-07-13 DIAGNOSIS — H2510 Age-related nuclear cataract, unspecified eye: Secondary | ICD-10-CM | POA: Diagnosis not present

## 2013-07-15 DIAGNOSIS — J309 Allergic rhinitis, unspecified: Secondary | ICD-10-CM | POA: Diagnosis not present

## 2013-07-22 DIAGNOSIS — J309 Allergic rhinitis, unspecified: Secondary | ICD-10-CM | POA: Diagnosis not present

## 2013-07-26 DIAGNOSIS — J309 Allergic rhinitis, unspecified: Secondary | ICD-10-CM | POA: Diagnosis not present

## 2013-07-29 DIAGNOSIS — J309 Allergic rhinitis, unspecified: Secondary | ICD-10-CM | POA: Diagnosis not present

## 2013-08-02 DIAGNOSIS — J309 Allergic rhinitis, unspecified: Secondary | ICD-10-CM | POA: Diagnosis not present

## 2013-08-09 DIAGNOSIS — J309 Allergic rhinitis, unspecified: Secondary | ICD-10-CM | POA: Diagnosis not present

## 2013-08-22 DIAGNOSIS — J45909 Unspecified asthma, uncomplicated: Secondary | ICD-10-CM | POA: Diagnosis not present

## 2013-08-22 DIAGNOSIS — J301 Allergic rhinitis due to pollen: Secondary | ICD-10-CM | POA: Diagnosis not present

## 2013-08-22 DIAGNOSIS — J309 Allergic rhinitis, unspecified: Secondary | ICD-10-CM | POA: Diagnosis not present

## 2013-08-22 DIAGNOSIS — J3081 Allergic rhinitis due to animal (cat) (dog) hair and dander: Secondary | ICD-10-CM | POA: Diagnosis not present

## 2013-08-22 DIAGNOSIS — J3089 Other allergic rhinitis: Secondary | ICD-10-CM | POA: Diagnosis not present

## 2013-08-29 DIAGNOSIS — J309 Allergic rhinitis, unspecified: Secondary | ICD-10-CM | POA: Diagnosis not present

## 2013-09-05 DIAGNOSIS — J309 Allergic rhinitis, unspecified: Secondary | ICD-10-CM | POA: Diagnosis not present

## 2013-09-06 DIAGNOSIS — Z0000 Encounter for general adult medical examination without abnormal findings: Secondary | ICD-10-CM | POA: Diagnosis not present

## 2013-09-06 DIAGNOSIS — H9190 Unspecified hearing loss, unspecified ear: Secondary | ICD-10-CM | POA: Diagnosis not present

## 2013-09-06 DIAGNOSIS — E039 Hypothyroidism, unspecified: Secondary | ICD-10-CM | POA: Diagnosis not present

## 2013-09-06 DIAGNOSIS — Z23 Encounter for immunization: Secondary | ICD-10-CM | POA: Diagnosis not present

## 2013-09-07 ENCOUNTER — Other Ambulatory Visit: Payer: Self-pay | Admitting: Family Medicine

## 2013-09-07 DIAGNOSIS — M810 Age-related osteoporosis without current pathological fracture: Secondary | ICD-10-CM

## 2013-09-12 DIAGNOSIS — J309 Allergic rhinitis, unspecified: Secondary | ICD-10-CM | POA: Diagnosis not present

## 2013-09-19 DIAGNOSIS — J309 Allergic rhinitis, unspecified: Secondary | ICD-10-CM | POA: Diagnosis not present

## 2013-09-20 ENCOUNTER — Other Ambulatory Visit: Payer: Self-pay | Admitting: Family Medicine

## 2013-09-20 DIAGNOSIS — Z1231 Encounter for screening mammogram for malignant neoplasm of breast: Secondary | ICD-10-CM

## 2013-09-26 DIAGNOSIS — J309 Allergic rhinitis, unspecified: Secondary | ICD-10-CM | POA: Diagnosis not present

## 2013-10-03 DIAGNOSIS — H903 Sensorineural hearing loss, bilateral: Secondary | ICD-10-CM | POA: Diagnosis not present

## 2013-10-03 DIAGNOSIS — J309 Allergic rhinitis, unspecified: Secondary | ICD-10-CM | POA: Diagnosis not present

## 2013-10-05 DIAGNOSIS — J301 Allergic rhinitis due to pollen: Secondary | ICD-10-CM | POA: Diagnosis not present

## 2013-10-05 DIAGNOSIS — J3081 Allergic rhinitis due to animal (cat) (dog) hair and dander: Secondary | ICD-10-CM | POA: Diagnosis not present

## 2013-10-05 DIAGNOSIS — J45909 Unspecified asthma, uncomplicated: Secondary | ICD-10-CM | POA: Diagnosis not present

## 2013-10-05 DIAGNOSIS — R059 Cough, unspecified: Secondary | ICD-10-CM | POA: Diagnosis not present

## 2013-10-05 DIAGNOSIS — R05 Cough: Secondary | ICD-10-CM | POA: Diagnosis not present

## 2013-10-05 DIAGNOSIS — J3089 Other allergic rhinitis: Secondary | ICD-10-CM | POA: Diagnosis not present

## 2013-10-11 DIAGNOSIS — J309 Allergic rhinitis, unspecified: Secondary | ICD-10-CM | POA: Diagnosis not present

## 2013-10-17 DIAGNOSIS — J309 Allergic rhinitis, unspecified: Secondary | ICD-10-CM | POA: Diagnosis not present

## 2013-10-19 DIAGNOSIS — J309 Allergic rhinitis, unspecified: Secondary | ICD-10-CM | POA: Diagnosis not present

## 2013-10-24 DIAGNOSIS — J309 Allergic rhinitis, unspecified: Secondary | ICD-10-CM | POA: Diagnosis not present

## 2013-10-31 DIAGNOSIS — J309 Allergic rhinitis, unspecified: Secondary | ICD-10-CM | POA: Diagnosis not present

## 2013-11-07 DIAGNOSIS — J309 Allergic rhinitis, unspecified: Secondary | ICD-10-CM | POA: Diagnosis not present

## 2013-11-10 ENCOUNTER — Other Ambulatory Visit: Payer: Self-pay | Admitting: Family Medicine

## 2013-11-10 DIAGNOSIS — R102 Pelvic and perineal pain: Secondary | ICD-10-CM

## 2013-11-10 DIAGNOSIS — S3210XA Unspecified fracture of sacrum, initial encounter for closed fracture: Secondary | ICD-10-CM

## 2013-11-10 DIAGNOSIS — M533 Sacrococcygeal disorders, not elsewhere classified: Secondary | ICD-10-CM

## 2013-11-10 DIAGNOSIS — R52 Pain, unspecified: Secondary | ICD-10-CM

## 2013-11-14 DIAGNOSIS — J309 Allergic rhinitis, unspecified: Secondary | ICD-10-CM | POA: Diagnosis not present

## 2013-11-16 ENCOUNTER — Ambulatory Visit
Admission: RE | Admit: 2013-11-16 | Discharge: 2013-11-16 | Disposition: A | Discharge status: Home / Self Care | Payer: Medicare Other | Source: Ambulatory Visit | Attending: Family Medicine | Admitting: Family Medicine

## 2013-11-16 DIAGNOSIS — M533 Sacrococcygeal disorders, not elsewhere classified: Secondary | ICD-10-CM | POA: Diagnosis not present

## 2013-11-18 DIAGNOSIS — J309 Allergic rhinitis, unspecified: Secondary | ICD-10-CM | POA: Diagnosis not present

## 2013-11-22 DIAGNOSIS — J309 Allergic rhinitis, unspecified: Secondary | ICD-10-CM | POA: Diagnosis not present

## 2013-11-23 DIAGNOSIS — J309 Allergic rhinitis, unspecified: Secondary | ICD-10-CM | POA: Diagnosis not present

## 2013-11-24 DIAGNOSIS — J309 Allergic rhinitis, unspecified: Secondary | ICD-10-CM | POA: Diagnosis not present

## 2013-11-30 DIAGNOSIS — J309 Allergic rhinitis, unspecified: Secondary | ICD-10-CM | POA: Diagnosis not present

## 2013-12-02 DIAGNOSIS — M545 Low back pain, unspecified: Secondary | ICD-10-CM | POA: Diagnosis not present

## 2013-12-05 DIAGNOSIS — M545 Low back pain, unspecified: Secondary | ICD-10-CM | POA: Diagnosis not present

## 2013-12-07 DIAGNOSIS — J309 Allergic rhinitis, unspecified: Secondary | ICD-10-CM | POA: Diagnosis not present

## 2013-12-09 DIAGNOSIS — M545 Low back pain, unspecified: Secondary | ICD-10-CM | POA: Diagnosis not present

## 2013-12-12 DIAGNOSIS — M545 Low back pain, unspecified: Secondary | ICD-10-CM | POA: Diagnosis not present

## 2013-12-15 DIAGNOSIS — J309 Allergic rhinitis, unspecified: Secondary | ICD-10-CM | POA: Diagnosis not present

## 2013-12-16 DIAGNOSIS — M545 Low back pain, unspecified: Secondary | ICD-10-CM | POA: Diagnosis not present

## 2013-12-20 DIAGNOSIS — M545 Low back pain, unspecified: Secondary | ICD-10-CM | POA: Diagnosis not present

## 2013-12-20 DIAGNOSIS — J309 Allergic rhinitis, unspecified: Secondary | ICD-10-CM | POA: Diagnosis not present

## 2013-12-22 DIAGNOSIS — M545 Low back pain, unspecified: Secondary | ICD-10-CM | POA: Diagnosis not present

## 2013-12-22 DIAGNOSIS — J309 Allergic rhinitis, unspecified: Secondary | ICD-10-CM | POA: Diagnosis not present

## 2013-12-27 DIAGNOSIS — M545 Low back pain, unspecified: Secondary | ICD-10-CM | POA: Diagnosis not present

## 2013-12-27 DIAGNOSIS — J309 Allergic rhinitis, unspecified: Secondary | ICD-10-CM | POA: Diagnosis not present

## 2014-01-02 DIAGNOSIS — M545 Low back pain, unspecified: Secondary | ICD-10-CM | POA: Diagnosis not present

## 2014-01-03 DIAGNOSIS — J309 Allergic rhinitis, unspecified: Secondary | ICD-10-CM | POA: Diagnosis not present

## 2014-01-05 DIAGNOSIS — J309 Allergic rhinitis, unspecified: Secondary | ICD-10-CM | POA: Diagnosis not present

## 2014-01-10 DIAGNOSIS — J309 Allergic rhinitis, unspecified: Secondary | ICD-10-CM | POA: Diagnosis not present

## 2014-01-13 DIAGNOSIS — M545 Low back pain, unspecified: Secondary | ICD-10-CM | POA: Diagnosis not present

## 2014-01-17 DIAGNOSIS — J309 Allergic rhinitis, unspecified: Secondary | ICD-10-CM | POA: Diagnosis not present

## 2014-01-25 DIAGNOSIS — J309 Allergic rhinitis, unspecified: Secondary | ICD-10-CM | POA: Diagnosis not present

## 2014-02-01 DIAGNOSIS — J309 Allergic rhinitis, unspecified: Secondary | ICD-10-CM | POA: Diagnosis not present

## 2014-02-08 DIAGNOSIS — J309 Allergic rhinitis, unspecified: Secondary | ICD-10-CM | POA: Diagnosis not present

## 2014-02-16 DIAGNOSIS — J309 Allergic rhinitis, unspecified: Secondary | ICD-10-CM | POA: Diagnosis not present

## 2014-02-17 ENCOUNTER — Other Ambulatory Visit: Payer: Self-pay | Admitting: Orthopedic Surgery

## 2014-02-17 DIAGNOSIS — M5430 Sciatica, unspecified side: Secondary | ICD-10-CM | POA: Diagnosis not present

## 2014-02-17 DIAGNOSIS — M4310 Spondylolisthesis, site unspecified: Secondary | ICD-10-CM | POA: Diagnosis not present

## 2014-02-17 DIAGNOSIS — M4716 Other spondylosis with myelopathy, lumbar region: Secondary | ICD-10-CM | POA: Diagnosis not present

## 2014-02-17 DIAGNOSIS — M47816 Spondylosis without myelopathy or radiculopathy, lumbar region: Secondary | ICD-10-CM

## 2014-02-17 DIAGNOSIS — M549 Dorsalgia, unspecified: Secondary | ICD-10-CM | POA: Diagnosis not present

## 2014-02-20 ENCOUNTER — Ambulatory Visit
Admission: RE | Admit: 2014-02-20 | Discharge: 2014-02-20 | Disposition: A | Discharge status: Home / Self Care | Payer: Medicare Other | Source: Ambulatory Visit | Attending: Orthopedic Surgery | Admitting: Orthopedic Surgery

## 2014-02-20 DIAGNOSIS — M48061 Spinal stenosis, lumbar region without neurogenic claudication: Secondary | ICD-10-CM | POA: Diagnosis not present

## 2014-02-20 DIAGNOSIS — M47816 Spondylosis without myelopathy or radiculopathy, lumbar region: Secondary | ICD-10-CM

## 2014-02-20 DIAGNOSIS — M5126 Other intervertebral disc displacement, lumbar region: Secondary | ICD-10-CM | POA: Diagnosis not present

## 2014-02-23 DIAGNOSIS — J309 Allergic rhinitis, unspecified: Secondary | ICD-10-CM | POA: Diagnosis not present

## 2014-02-27 ENCOUNTER — Ambulatory Visit
Admission: RE | Admit: 2014-02-27 | Discharge: 2014-02-27 | Disposition: A | Discharge status: Home / Self Care | Payer: Medicare Other | Source: Ambulatory Visit | Attending: Family Medicine | Admitting: Family Medicine

## 2014-02-27 DIAGNOSIS — Z1231 Encounter for screening mammogram for malignant neoplasm of breast: Secondary | ICD-10-CM | POA: Diagnosis not present

## 2014-02-27 DIAGNOSIS — M810 Age-related osteoporosis without current pathological fracture: Secondary | ICD-10-CM

## 2014-03-03 DIAGNOSIS — J309 Allergic rhinitis, unspecified: Secondary | ICD-10-CM | POA: Diagnosis not present

## 2014-03-08 DIAGNOSIS — J309 Allergic rhinitis, unspecified: Secondary | ICD-10-CM | POA: Diagnosis not present

## 2014-03-16 DIAGNOSIS — J309 Allergic rhinitis, unspecified: Secondary | ICD-10-CM | POA: Diagnosis not present

## 2014-03-17 DIAGNOSIS — M4310 Spondylolisthesis, site unspecified: Secondary | ICD-10-CM | POA: Diagnosis not present

## 2014-03-17 DIAGNOSIS — M5430 Sciatica, unspecified side: Secondary | ICD-10-CM | POA: Diagnosis not present

## 2014-03-17 DIAGNOSIS — M4716 Other spondylosis with myelopathy, lumbar region: Secondary | ICD-10-CM | POA: Diagnosis not present

## 2014-03-17 DIAGNOSIS — M48062 Spinal stenosis, lumbar region with neurogenic claudication: Secondary | ICD-10-CM | POA: Diagnosis not present

## 2014-03-22 DIAGNOSIS — J309 Allergic rhinitis, unspecified: Secondary | ICD-10-CM | POA: Diagnosis not present

## 2014-03-23 DIAGNOSIS — J309 Allergic rhinitis, unspecified: Secondary | ICD-10-CM | POA: Diagnosis not present

## 2014-03-27 DIAGNOSIS — J3081 Allergic rhinitis due to animal (cat) (dog) hair and dander: Secondary | ICD-10-CM | POA: Diagnosis not present

## 2014-03-27 DIAGNOSIS — J301 Allergic rhinitis due to pollen: Secondary | ICD-10-CM | POA: Diagnosis not present

## 2014-03-27 DIAGNOSIS — J45909 Unspecified asthma, uncomplicated: Secondary | ICD-10-CM | POA: Diagnosis not present

## 2014-03-27 DIAGNOSIS — J3089 Other allergic rhinitis: Secondary | ICD-10-CM | POA: Diagnosis not present

## 2014-03-29 DIAGNOSIS — J309 Allergic rhinitis, unspecified: Secondary | ICD-10-CM | POA: Diagnosis not present

## 2014-04-05 DIAGNOSIS — D809 Immunodeficiency with predominantly antibody defects, unspecified: Secondary | ICD-10-CM | POA: Diagnosis not present

## 2014-04-06 DIAGNOSIS — J3081 Allergic rhinitis due to animal (cat) (dog) hair and dander: Secondary | ICD-10-CM | POA: Diagnosis not present

## 2014-04-06 DIAGNOSIS — J301 Allergic rhinitis due to pollen: Secondary | ICD-10-CM | POA: Diagnosis not present

## 2014-04-06 DIAGNOSIS — J3089 Other allergic rhinitis: Secondary | ICD-10-CM | POA: Diagnosis not present

## 2014-04-10 DIAGNOSIS — M810 Age-related osteoporosis without current pathological fracture: Secondary | ICD-10-CM | POA: Diagnosis not present

## 2014-04-10 DIAGNOSIS — M5416 Radiculopathy, lumbar region: Secondary | ICD-10-CM | POA: Diagnosis not present

## 2014-04-10 DIAGNOSIS — Z23 Encounter for immunization: Secondary | ICD-10-CM | POA: Diagnosis not present

## 2014-04-10 DIAGNOSIS — Z79899 Other long term (current) drug therapy: Secondary | ICD-10-CM | POA: Diagnosis not present

## 2014-04-10 DIAGNOSIS — M5440 Lumbago with sciatica, unspecified side: Secondary | ICD-10-CM | POA: Diagnosis not present

## 2014-04-10 DIAGNOSIS — M4806 Spinal stenosis, lumbar region: Secondary | ICD-10-CM | POA: Diagnosis not present

## 2014-04-11 DIAGNOSIS — J3089 Other allergic rhinitis: Secondary | ICD-10-CM | POA: Diagnosis not present

## 2014-04-11 DIAGNOSIS — J301 Allergic rhinitis due to pollen: Secondary | ICD-10-CM | POA: Diagnosis not present

## 2014-04-12 DIAGNOSIS — M5430 Sciatica, unspecified side: Secondary | ICD-10-CM | POA: Diagnosis not present

## 2014-04-12 DIAGNOSIS — M5431 Sciatica, right side: Secondary | ICD-10-CM | POA: Diagnosis not present

## 2014-04-12 DIAGNOSIS — Z23 Encounter for immunization: Secondary | ICD-10-CM | POA: Diagnosis not present

## 2014-04-12 DIAGNOSIS — M545 Low back pain: Secondary | ICD-10-CM | POA: Diagnosis not present

## 2014-04-12 DIAGNOSIS — M5416 Radiculopathy, lumbar region: Secondary | ICD-10-CM | POA: Diagnosis present

## 2014-04-12 DIAGNOSIS — M4316 Spondylolisthesis, lumbar region: Secondary | ICD-10-CM | POA: Diagnosis not present

## 2014-04-12 DIAGNOSIS — M4806 Spinal stenosis, lumbar region: Secondary | ICD-10-CM | POA: Diagnosis not present

## 2014-04-12 DIAGNOSIS — Z981 Arthrodesis status: Secondary | ICD-10-CM | POA: Diagnosis not present

## 2014-04-12 DIAGNOSIS — M810 Age-related osteoporosis without current pathological fracture: Secondary | ICD-10-CM | POA: Diagnosis present

## 2014-04-12 DIAGNOSIS — M5432 Sciatica, left side: Secondary | ICD-10-CM | POA: Diagnosis not present

## 2014-04-12 DIAGNOSIS — M4300 Spondylolysis, site unspecified: Secondary | ICD-10-CM | POA: Diagnosis not present

## 2014-04-12 DIAGNOSIS — Z79899 Other long term (current) drug therapy: Secondary | ICD-10-CM | POA: Diagnosis not present

## 2014-04-12 DIAGNOSIS — Z9889 Other specified postprocedural states: Secondary | ICD-10-CM | POA: Diagnosis not present

## 2014-04-12 DIAGNOSIS — M4716 Other spondylosis with myelopathy, lumbar region: Secondary | ICD-10-CM | POA: Diagnosis not present

## 2014-04-16 DIAGNOSIS — M1990 Unspecified osteoarthritis, unspecified site: Secondary | ICD-10-CM | POA: Diagnosis not present

## 2014-04-16 DIAGNOSIS — M818 Other osteoporosis without current pathological fracture: Secondary | ICD-10-CM | POA: Diagnosis not present

## 2014-04-16 DIAGNOSIS — Z48812 Encounter for surgical aftercare following surgery on the circulatory system: Secondary | ICD-10-CM | POA: Diagnosis not present

## 2014-04-16 DIAGNOSIS — M4806 Spinal stenosis, lumbar region: Secondary | ICD-10-CM | POA: Diagnosis not present

## 2014-04-16 DIAGNOSIS — M4316 Spondylolisthesis, lumbar region: Secondary | ICD-10-CM | POA: Diagnosis not present

## 2014-04-18 DIAGNOSIS — M818 Other osteoporosis without current pathological fracture: Secondary | ICD-10-CM | POA: Diagnosis not present

## 2014-04-18 DIAGNOSIS — Z48812 Encounter for surgical aftercare following surgery on the circulatory system: Secondary | ICD-10-CM | POA: Diagnosis not present

## 2014-04-18 DIAGNOSIS — M1990 Unspecified osteoarthritis, unspecified site: Secondary | ICD-10-CM | POA: Diagnosis not present

## 2014-04-18 DIAGNOSIS — M4806 Spinal stenosis, lumbar region: Secondary | ICD-10-CM | POA: Diagnosis not present

## 2014-04-18 DIAGNOSIS — M4316 Spondylolisthesis, lumbar region: Secondary | ICD-10-CM | POA: Diagnosis not present

## 2014-04-20 DIAGNOSIS — M818 Other osteoporosis without current pathological fracture: Secondary | ICD-10-CM | POA: Diagnosis not present

## 2014-04-20 DIAGNOSIS — M4316 Spondylolisthesis, lumbar region: Secondary | ICD-10-CM | POA: Diagnosis not present

## 2014-04-20 DIAGNOSIS — Z48812 Encounter for surgical aftercare following surgery on the circulatory system: Secondary | ICD-10-CM | POA: Diagnosis not present

## 2014-04-20 DIAGNOSIS — M1990 Unspecified osteoarthritis, unspecified site: Secondary | ICD-10-CM | POA: Diagnosis not present

## 2014-04-20 DIAGNOSIS — M4806 Spinal stenosis, lumbar region: Secondary | ICD-10-CM | POA: Diagnosis not present

## 2014-04-21 DIAGNOSIS — M1990 Unspecified osteoarthritis, unspecified site: Secondary | ICD-10-CM | POA: Diagnosis not present

## 2014-04-21 DIAGNOSIS — Z48812 Encounter for surgical aftercare following surgery on the circulatory system: Secondary | ICD-10-CM | POA: Diagnosis not present

## 2014-04-21 DIAGNOSIS — M4316 Spondylolisthesis, lumbar region: Secondary | ICD-10-CM | POA: Diagnosis not present

## 2014-04-21 DIAGNOSIS — M818 Other osteoporosis without current pathological fracture: Secondary | ICD-10-CM | POA: Diagnosis not present

## 2014-04-21 DIAGNOSIS — M4806 Spinal stenosis, lumbar region: Secondary | ICD-10-CM | POA: Diagnosis not present

## 2014-04-24 DIAGNOSIS — M818 Other osteoporosis without current pathological fracture: Secondary | ICD-10-CM | POA: Diagnosis not present

## 2014-04-24 DIAGNOSIS — M4806 Spinal stenosis, lumbar region: Secondary | ICD-10-CM | POA: Diagnosis not present

## 2014-04-24 DIAGNOSIS — M1990 Unspecified osteoarthritis, unspecified site: Secondary | ICD-10-CM | POA: Diagnosis not present

## 2014-04-24 DIAGNOSIS — M4316 Spondylolisthesis, lumbar region: Secondary | ICD-10-CM | POA: Diagnosis not present

## 2014-04-24 DIAGNOSIS — Z48812 Encounter for surgical aftercare following surgery on the circulatory system: Secondary | ICD-10-CM | POA: Diagnosis not present

## 2014-04-26 DIAGNOSIS — M4806 Spinal stenosis, lumbar region: Secondary | ICD-10-CM | POA: Diagnosis not present

## 2014-04-26 DIAGNOSIS — Z48812 Encounter for surgical aftercare following surgery on the circulatory system: Secondary | ICD-10-CM | POA: Diagnosis not present

## 2014-04-26 DIAGNOSIS — M1990 Unspecified osteoarthritis, unspecified site: Secondary | ICD-10-CM | POA: Diagnosis not present

## 2014-04-26 DIAGNOSIS — M4316 Spondylolisthesis, lumbar region: Secondary | ICD-10-CM | POA: Diagnosis not present

## 2014-04-26 DIAGNOSIS — M818 Other osteoporosis without current pathological fracture: Secondary | ICD-10-CM | POA: Diagnosis not present

## 2014-04-27 DIAGNOSIS — J301 Allergic rhinitis due to pollen: Secondary | ICD-10-CM | POA: Diagnosis not present

## 2014-04-27 DIAGNOSIS — J3081 Allergic rhinitis due to animal (cat) (dog) hair and dander: Secondary | ICD-10-CM | POA: Diagnosis not present

## 2014-04-27 DIAGNOSIS — J3089 Other allergic rhinitis: Secondary | ICD-10-CM | POA: Diagnosis not present

## 2014-04-28 DIAGNOSIS — M4806 Spinal stenosis, lumbar region: Secondary | ICD-10-CM | POA: Diagnosis not present

## 2014-04-28 DIAGNOSIS — M818 Other osteoporosis without current pathological fracture: Secondary | ICD-10-CM | POA: Diagnosis not present

## 2014-04-28 DIAGNOSIS — M1990 Unspecified osteoarthritis, unspecified site: Secondary | ICD-10-CM | POA: Diagnosis not present

## 2014-04-28 DIAGNOSIS — Z48812 Encounter for surgical aftercare following surgery on the circulatory system: Secondary | ICD-10-CM | POA: Diagnosis not present

## 2014-04-28 DIAGNOSIS — M4316 Spondylolisthesis, lumbar region: Secondary | ICD-10-CM | POA: Diagnosis not present

## 2014-05-01 DIAGNOSIS — M4316 Spondylolisthesis, lumbar region: Secondary | ICD-10-CM | POA: Diagnosis not present

## 2014-05-01 DIAGNOSIS — M1990 Unspecified osteoarthritis, unspecified site: Secondary | ICD-10-CM | POA: Diagnosis not present

## 2014-05-01 DIAGNOSIS — M4806 Spinal stenosis, lumbar region: Secondary | ICD-10-CM | POA: Diagnosis not present

## 2014-05-01 DIAGNOSIS — M818 Other osteoporosis without current pathological fracture: Secondary | ICD-10-CM | POA: Diagnosis not present

## 2014-05-01 DIAGNOSIS — Z48812 Encounter for surgical aftercare following surgery on the circulatory system: Secondary | ICD-10-CM | POA: Diagnosis not present

## 2014-05-03 DIAGNOSIS — J3089 Other allergic rhinitis: Secondary | ICD-10-CM | POA: Diagnosis not present

## 2014-05-09 DIAGNOSIS — J301 Allergic rhinitis due to pollen: Secondary | ICD-10-CM | POA: Diagnosis not present

## 2014-05-09 DIAGNOSIS — J3089 Other allergic rhinitis: Secondary | ICD-10-CM | POA: Diagnosis not present

## 2014-05-09 DIAGNOSIS — J3081 Allergic rhinitis due to animal (cat) (dog) hair and dander: Secondary | ICD-10-CM | POA: Diagnosis not present

## 2014-05-12 DIAGNOSIS — M5440 Lumbago with sciatica, unspecified side: Secondary | ICD-10-CM | POA: Diagnosis not present

## 2014-05-18 DIAGNOSIS — J3081 Allergic rhinitis due to animal (cat) (dog) hair and dander: Secondary | ICD-10-CM | POA: Diagnosis not present

## 2014-05-18 DIAGNOSIS — J3089 Other allergic rhinitis: Secondary | ICD-10-CM | POA: Diagnosis not present

## 2014-05-18 DIAGNOSIS — J301 Allergic rhinitis due to pollen: Secondary | ICD-10-CM | POA: Diagnosis not present

## 2014-05-23 DIAGNOSIS — J3081 Allergic rhinitis due to animal (cat) (dog) hair and dander: Secondary | ICD-10-CM | POA: Diagnosis not present

## 2014-05-23 DIAGNOSIS — J3089 Other allergic rhinitis: Secondary | ICD-10-CM | POA: Diagnosis not present

## 2014-05-23 DIAGNOSIS — J301 Allergic rhinitis due to pollen: Secondary | ICD-10-CM | POA: Diagnosis not present

## 2014-05-29 DIAGNOSIS — J3081 Allergic rhinitis due to animal (cat) (dog) hair and dander: Secondary | ICD-10-CM | POA: Diagnosis not present

## 2014-05-29 DIAGNOSIS — J301 Allergic rhinitis due to pollen: Secondary | ICD-10-CM | POA: Diagnosis not present

## 2014-05-29 DIAGNOSIS — J3089 Other allergic rhinitis: Secondary | ICD-10-CM | POA: Diagnosis not present

## 2014-05-31 DIAGNOSIS — J301 Allergic rhinitis due to pollen: Secondary | ICD-10-CM | POA: Diagnosis not present

## 2014-05-31 DIAGNOSIS — J3081 Allergic rhinitis due to animal (cat) (dog) hair and dander: Secondary | ICD-10-CM | POA: Diagnosis not present

## 2014-05-31 DIAGNOSIS — J3089 Other allergic rhinitis: Secondary | ICD-10-CM | POA: Diagnosis not present

## 2014-06-05 DIAGNOSIS — J3089 Other allergic rhinitis: Secondary | ICD-10-CM | POA: Diagnosis not present

## 2014-06-05 DIAGNOSIS — J301 Allergic rhinitis due to pollen: Secondary | ICD-10-CM | POA: Diagnosis not present

## 2014-06-05 DIAGNOSIS — J3081 Allergic rhinitis due to animal (cat) (dog) hair and dander: Secondary | ICD-10-CM | POA: Diagnosis not present

## 2014-06-12 DIAGNOSIS — J301 Allergic rhinitis due to pollen: Secondary | ICD-10-CM | POA: Diagnosis not present

## 2014-06-12 DIAGNOSIS — J3089 Other allergic rhinitis: Secondary | ICD-10-CM | POA: Diagnosis not present

## 2014-06-12 DIAGNOSIS — J3081 Allergic rhinitis due to animal (cat) (dog) hair and dander: Secondary | ICD-10-CM | POA: Diagnosis not present

## 2014-06-19 DIAGNOSIS — J3089 Other allergic rhinitis: Secondary | ICD-10-CM | POA: Diagnosis not present

## 2014-06-19 DIAGNOSIS — J3081 Allergic rhinitis due to animal (cat) (dog) hair and dander: Secondary | ICD-10-CM | POA: Diagnosis not present

## 2014-06-19 DIAGNOSIS — J301 Allergic rhinitis due to pollen: Secondary | ICD-10-CM | POA: Diagnosis not present

## 2014-06-27 DIAGNOSIS — J3089 Other allergic rhinitis: Secondary | ICD-10-CM | POA: Diagnosis not present

## 2014-06-27 DIAGNOSIS — J301 Allergic rhinitis due to pollen: Secondary | ICD-10-CM | POA: Diagnosis not present

## 2014-06-27 DIAGNOSIS — J3081 Allergic rhinitis due to animal (cat) (dog) hair and dander: Secondary | ICD-10-CM | POA: Diagnosis not present

## 2014-07-03 DIAGNOSIS — J3089 Other allergic rhinitis: Secondary | ICD-10-CM | POA: Diagnosis not present

## 2014-07-03 DIAGNOSIS — J301 Allergic rhinitis due to pollen: Secondary | ICD-10-CM | POA: Diagnosis not present

## 2014-07-03 DIAGNOSIS — J3081 Allergic rhinitis due to animal (cat) (dog) hair and dander: Secondary | ICD-10-CM | POA: Diagnosis not present

## 2014-07-07 HISTORY — PX: CATARACT EXTRACTION: SUR2

## 2014-07-12 DIAGNOSIS — J3089 Other allergic rhinitis: Secondary | ICD-10-CM | POA: Diagnosis not present

## 2014-07-12 DIAGNOSIS — J301 Allergic rhinitis due to pollen: Secondary | ICD-10-CM | POA: Diagnosis not present

## 2014-07-12 DIAGNOSIS — J3081 Allergic rhinitis due to animal (cat) (dog) hair and dander: Secondary | ICD-10-CM | POA: Diagnosis not present

## 2014-07-14 DIAGNOSIS — M4316 Spondylolisthesis, lumbar region: Secondary | ICD-10-CM | POA: Diagnosis not present

## 2014-07-14 DIAGNOSIS — M4806 Spinal stenosis, lumbar region: Secondary | ICD-10-CM | POA: Diagnosis not present

## 2014-07-14 DIAGNOSIS — M5440 Lumbago with sciatica, unspecified side: Secondary | ICD-10-CM | POA: Diagnosis not present

## 2014-07-18 DIAGNOSIS — J3089 Other allergic rhinitis: Secondary | ICD-10-CM | POA: Diagnosis not present

## 2014-07-18 DIAGNOSIS — J3081 Allergic rhinitis due to animal (cat) (dog) hair and dander: Secondary | ICD-10-CM | POA: Diagnosis not present

## 2014-07-18 DIAGNOSIS — J301 Allergic rhinitis due to pollen: Secondary | ICD-10-CM | POA: Diagnosis not present

## 2014-07-21 DIAGNOSIS — M545 Low back pain: Secondary | ICD-10-CM | POA: Diagnosis not present

## 2014-07-25 DIAGNOSIS — J301 Allergic rhinitis due to pollen: Secondary | ICD-10-CM | POA: Diagnosis not present

## 2014-07-25 DIAGNOSIS — M545 Low back pain: Secondary | ICD-10-CM | POA: Diagnosis not present

## 2014-07-25 DIAGNOSIS — J3089 Other allergic rhinitis: Secondary | ICD-10-CM | POA: Diagnosis not present

## 2014-07-25 DIAGNOSIS — J3081 Allergic rhinitis due to animal (cat) (dog) hair and dander: Secondary | ICD-10-CM | POA: Diagnosis not present

## 2014-07-27 DIAGNOSIS — M545 Low back pain: Secondary | ICD-10-CM | POA: Diagnosis not present

## 2014-08-01 DIAGNOSIS — M545 Low back pain: Secondary | ICD-10-CM | POA: Diagnosis not present

## 2014-08-01 DIAGNOSIS — J3081 Allergic rhinitis due to animal (cat) (dog) hair and dander: Secondary | ICD-10-CM | POA: Diagnosis not present

## 2014-08-01 DIAGNOSIS — J301 Allergic rhinitis due to pollen: Secondary | ICD-10-CM | POA: Diagnosis not present

## 2014-08-01 DIAGNOSIS — J3089 Other allergic rhinitis: Secondary | ICD-10-CM | POA: Diagnosis not present

## 2014-08-03 DIAGNOSIS — M545 Low back pain: Secondary | ICD-10-CM | POA: Diagnosis not present

## 2014-08-08 DIAGNOSIS — J3089 Other allergic rhinitis: Secondary | ICD-10-CM | POA: Diagnosis not present

## 2014-08-08 DIAGNOSIS — J3081 Allergic rhinitis due to animal (cat) (dog) hair and dander: Secondary | ICD-10-CM | POA: Diagnosis not present

## 2014-08-08 DIAGNOSIS — J301 Allergic rhinitis due to pollen: Secondary | ICD-10-CM | POA: Diagnosis not present

## 2014-08-08 DIAGNOSIS — M545 Low back pain: Secondary | ICD-10-CM | POA: Diagnosis not present

## 2014-08-10 DIAGNOSIS — M545 Low back pain: Secondary | ICD-10-CM | POA: Diagnosis not present

## 2014-08-15 DIAGNOSIS — J3081 Allergic rhinitis due to animal (cat) (dog) hair and dander: Secondary | ICD-10-CM | POA: Diagnosis not present

## 2014-08-15 DIAGNOSIS — J301 Allergic rhinitis due to pollen: Secondary | ICD-10-CM | POA: Diagnosis not present

## 2014-08-15 DIAGNOSIS — M545 Low back pain: Secondary | ICD-10-CM | POA: Diagnosis not present

## 2014-08-15 DIAGNOSIS — J3089 Other allergic rhinitis: Secondary | ICD-10-CM | POA: Diagnosis not present

## 2014-08-17 DIAGNOSIS — M545 Low back pain: Secondary | ICD-10-CM | POA: Diagnosis not present

## 2014-08-22 DIAGNOSIS — M545 Low back pain: Secondary | ICD-10-CM | POA: Diagnosis not present

## 2014-08-22 DIAGNOSIS — J301 Allergic rhinitis due to pollen: Secondary | ICD-10-CM | POA: Diagnosis not present

## 2014-08-22 DIAGNOSIS — J3081 Allergic rhinitis due to animal (cat) (dog) hair and dander: Secondary | ICD-10-CM | POA: Diagnosis not present

## 2014-08-22 DIAGNOSIS — J3089 Other allergic rhinitis: Secondary | ICD-10-CM | POA: Diagnosis not present

## 2014-08-24 DIAGNOSIS — M545 Low back pain: Secondary | ICD-10-CM | POA: Diagnosis not present

## 2014-08-29 DIAGNOSIS — M545 Low back pain: Secondary | ICD-10-CM | POA: Diagnosis not present

## 2014-08-31 DIAGNOSIS — M545 Low back pain: Secondary | ICD-10-CM | POA: Diagnosis not present

## 2014-08-31 DIAGNOSIS — J3089 Other allergic rhinitis: Secondary | ICD-10-CM | POA: Diagnosis not present

## 2014-08-31 DIAGNOSIS — J301 Allergic rhinitis due to pollen: Secondary | ICD-10-CM | POA: Diagnosis not present

## 2014-08-31 DIAGNOSIS — J3081 Allergic rhinitis due to animal (cat) (dog) hair and dander: Secondary | ICD-10-CM | POA: Diagnosis not present

## 2014-09-05 DIAGNOSIS — M545 Low back pain: Secondary | ICD-10-CM | POA: Diagnosis not present

## 2014-09-07 DIAGNOSIS — J3089 Other allergic rhinitis: Secondary | ICD-10-CM | POA: Diagnosis not present

## 2014-09-07 DIAGNOSIS — J3081 Allergic rhinitis due to animal (cat) (dog) hair and dander: Secondary | ICD-10-CM | POA: Diagnosis not present

## 2014-09-07 DIAGNOSIS — J301 Allergic rhinitis due to pollen: Secondary | ICD-10-CM | POA: Diagnosis not present

## 2014-09-08 DIAGNOSIS — H26492 Other secondary cataract, left eye: Secondary | ICD-10-CM | POA: Diagnosis not present

## 2014-09-08 DIAGNOSIS — M545 Low back pain: Secondary | ICD-10-CM | POA: Diagnosis not present

## 2014-09-14 DIAGNOSIS — J301 Allergic rhinitis due to pollen: Secondary | ICD-10-CM | POA: Diagnosis not present

## 2014-09-14 DIAGNOSIS — J3089 Other allergic rhinitis: Secondary | ICD-10-CM | POA: Diagnosis not present

## 2014-09-14 DIAGNOSIS — J3081 Allergic rhinitis due to animal (cat) (dog) hair and dander: Secondary | ICD-10-CM | POA: Diagnosis not present

## 2014-09-21 DIAGNOSIS — J301 Allergic rhinitis due to pollen: Secondary | ICD-10-CM | POA: Diagnosis not present

## 2014-09-21 DIAGNOSIS — J3089 Other allergic rhinitis: Secondary | ICD-10-CM | POA: Diagnosis not present

## 2014-09-21 DIAGNOSIS — J3081 Allergic rhinitis due to animal (cat) (dog) hair and dander: Secondary | ICD-10-CM | POA: Diagnosis not present

## 2014-09-22 DIAGNOSIS — M5440 Lumbago with sciatica, unspecified side: Secondary | ICD-10-CM | POA: Diagnosis not present

## 2014-09-22 DIAGNOSIS — M4806 Spinal stenosis, lumbar region: Secondary | ICD-10-CM | POA: Diagnosis not present

## 2014-09-22 DIAGNOSIS — M4316 Spondylolisthesis, lumbar region: Secondary | ICD-10-CM | POA: Diagnosis not present

## 2014-09-22 DIAGNOSIS — J3081 Allergic rhinitis due to animal (cat) (dog) hair and dander: Secondary | ICD-10-CM | POA: Diagnosis not present

## 2014-09-22 DIAGNOSIS — J301 Allergic rhinitis due to pollen: Secondary | ICD-10-CM | POA: Diagnosis not present

## 2014-09-22 DIAGNOSIS — J3089 Other allergic rhinitis: Secondary | ICD-10-CM | POA: Diagnosis not present

## 2014-09-28 DIAGNOSIS — J301 Allergic rhinitis due to pollen: Secondary | ICD-10-CM | POA: Diagnosis not present

## 2014-09-28 DIAGNOSIS — J3081 Allergic rhinitis due to animal (cat) (dog) hair and dander: Secondary | ICD-10-CM | POA: Diagnosis not present

## 2014-09-28 DIAGNOSIS — J3089 Other allergic rhinitis: Secondary | ICD-10-CM | POA: Diagnosis not present

## 2014-10-05 DIAGNOSIS — J3089 Other allergic rhinitis: Secondary | ICD-10-CM | POA: Diagnosis not present

## 2014-10-05 DIAGNOSIS — J301 Allergic rhinitis due to pollen: Secondary | ICD-10-CM | POA: Diagnosis not present

## 2014-10-05 DIAGNOSIS — J3081 Allergic rhinitis due to animal (cat) (dog) hair and dander: Secondary | ICD-10-CM | POA: Diagnosis not present

## 2014-10-12 DIAGNOSIS — H2640 Unspecified secondary cataract: Secondary | ICD-10-CM | POA: Diagnosis not present

## 2014-10-12 DIAGNOSIS — J3081 Allergic rhinitis due to animal (cat) (dog) hair and dander: Secondary | ICD-10-CM | POA: Diagnosis not present

## 2014-10-12 DIAGNOSIS — J301 Allergic rhinitis due to pollen: Secondary | ICD-10-CM | POA: Diagnosis not present

## 2014-10-12 DIAGNOSIS — H26492 Other secondary cataract, left eye: Secondary | ICD-10-CM | POA: Diagnosis not present

## 2014-10-12 DIAGNOSIS — J3089 Other allergic rhinitis: Secondary | ICD-10-CM | POA: Diagnosis not present

## 2014-10-19 DIAGNOSIS — J3089 Other allergic rhinitis: Secondary | ICD-10-CM | POA: Diagnosis not present

## 2014-10-19 DIAGNOSIS — J4530 Mild persistent asthma, uncomplicated: Secondary | ICD-10-CM | POA: Diagnosis not present

## 2014-10-19 DIAGNOSIS — J301 Allergic rhinitis due to pollen: Secondary | ICD-10-CM | POA: Diagnosis not present

## 2014-10-19 DIAGNOSIS — T781XXA Other adverse food reactions, not elsewhere classified, initial encounter: Secondary | ICD-10-CM | POA: Diagnosis not present

## 2014-10-19 DIAGNOSIS — J3081 Allergic rhinitis due to animal (cat) (dog) hair and dander: Secondary | ICD-10-CM | POA: Diagnosis not present

## 2014-10-27 DIAGNOSIS — J3089 Other allergic rhinitis: Secondary | ICD-10-CM | POA: Diagnosis not present

## 2014-10-27 DIAGNOSIS — J3081 Allergic rhinitis due to animal (cat) (dog) hair and dander: Secondary | ICD-10-CM | POA: Diagnosis not present

## 2014-10-27 DIAGNOSIS — J301 Allergic rhinitis due to pollen: Secondary | ICD-10-CM | POA: Diagnosis not present

## 2014-11-01 DIAGNOSIS — J3081 Allergic rhinitis due to animal (cat) (dog) hair and dander: Secondary | ICD-10-CM | POA: Diagnosis not present

## 2014-11-01 DIAGNOSIS — J301 Allergic rhinitis due to pollen: Secondary | ICD-10-CM | POA: Diagnosis not present

## 2014-11-01 DIAGNOSIS — J3089 Other allergic rhinitis: Secondary | ICD-10-CM | POA: Diagnosis not present

## 2014-11-09 DIAGNOSIS — J301 Allergic rhinitis due to pollen: Secondary | ICD-10-CM | POA: Diagnosis not present

## 2014-11-09 DIAGNOSIS — J3081 Allergic rhinitis due to animal (cat) (dog) hair and dander: Secondary | ICD-10-CM | POA: Diagnosis not present

## 2014-11-09 DIAGNOSIS — J3089 Other allergic rhinitis: Secondary | ICD-10-CM | POA: Diagnosis not present

## 2014-11-14 DIAGNOSIS — J3081 Allergic rhinitis due to animal (cat) (dog) hair and dander: Secondary | ICD-10-CM | POA: Diagnosis not present

## 2014-11-14 DIAGNOSIS — H2511 Age-related nuclear cataract, right eye: Secondary | ICD-10-CM | POA: Diagnosis not present

## 2014-11-14 DIAGNOSIS — J3089 Other allergic rhinitis: Secondary | ICD-10-CM | POA: Diagnosis not present

## 2014-11-14 DIAGNOSIS — J301 Allergic rhinitis due to pollen: Secondary | ICD-10-CM | POA: Diagnosis not present

## 2014-11-17 DIAGNOSIS — J3081 Allergic rhinitis due to animal (cat) (dog) hair and dander: Secondary | ICD-10-CM | POA: Diagnosis not present

## 2014-11-17 DIAGNOSIS — J3089 Other allergic rhinitis: Secondary | ICD-10-CM | POA: Diagnosis not present

## 2014-11-17 DIAGNOSIS — J301 Allergic rhinitis due to pollen: Secondary | ICD-10-CM | POA: Diagnosis not present

## 2014-11-22 DIAGNOSIS — J3089 Other allergic rhinitis: Secondary | ICD-10-CM | POA: Diagnosis not present

## 2014-11-22 DIAGNOSIS — J301 Allergic rhinitis due to pollen: Secondary | ICD-10-CM | POA: Diagnosis not present

## 2014-11-22 DIAGNOSIS — J3081 Allergic rhinitis due to animal (cat) (dog) hair and dander: Secondary | ICD-10-CM | POA: Diagnosis not present

## 2014-11-24 DIAGNOSIS — M4316 Spondylolisthesis, lumbar region: Secondary | ICD-10-CM | POA: Diagnosis not present

## 2014-11-24 DIAGNOSIS — M4806 Spinal stenosis, lumbar region: Secondary | ICD-10-CM | POA: Diagnosis not present

## 2014-11-27 DIAGNOSIS — E038 Other specified hypothyroidism: Secondary | ICD-10-CM | POA: Diagnosis not present

## 2014-11-27 DIAGNOSIS — M810 Age-related osteoporosis without current pathological fracture: Secondary | ICD-10-CM | POA: Diagnosis not present

## 2014-11-30 DIAGNOSIS — J3089 Other allergic rhinitis: Secondary | ICD-10-CM | POA: Diagnosis not present

## 2014-11-30 DIAGNOSIS — J301 Allergic rhinitis due to pollen: Secondary | ICD-10-CM | POA: Diagnosis not present

## 2014-11-30 DIAGNOSIS — J3081 Allergic rhinitis due to animal (cat) (dog) hair and dander: Secondary | ICD-10-CM | POA: Diagnosis not present

## 2014-12-07 DIAGNOSIS — J3089 Other allergic rhinitis: Secondary | ICD-10-CM | POA: Diagnosis not present

## 2014-12-07 DIAGNOSIS — J3081 Allergic rhinitis due to animal (cat) (dog) hair and dander: Secondary | ICD-10-CM | POA: Diagnosis not present

## 2014-12-07 DIAGNOSIS — J301 Allergic rhinitis due to pollen: Secondary | ICD-10-CM | POA: Diagnosis not present

## 2014-12-13 DIAGNOSIS — J301 Allergic rhinitis due to pollen: Secondary | ICD-10-CM | POA: Diagnosis not present

## 2014-12-13 DIAGNOSIS — J3089 Other allergic rhinitis: Secondary | ICD-10-CM | POA: Diagnosis not present

## 2014-12-21 DIAGNOSIS — H2511 Age-related nuclear cataract, right eye: Secondary | ICD-10-CM | POA: Diagnosis not present

## 2014-12-21 DIAGNOSIS — H25811 Combined forms of age-related cataract, right eye: Secondary | ICD-10-CM | POA: Diagnosis not present

## 2014-12-22 DIAGNOSIS — J3081 Allergic rhinitis due to animal (cat) (dog) hair and dander: Secondary | ICD-10-CM | POA: Diagnosis not present

## 2014-12-22 DIAGNOSIS — J3089 Other allergic rhinitis: Secondary | ICD-10-CM | POA: Diagnosis not present

## 2014-12-22 DIAGNOSIS — J301 Allergic rhinitis due to pollen: Secondary | ICD-10-CM | POA: Diagnosis not present

## 2014-12-27 DIAGNOSIS — J3081 Allergic rhinitis due to animal (cat) (dog) hair and dander: Secondary | ICD-10-CM | POA: Diagnosis not present

## 2014-12-27 DIAGNOSIS — J301 Allergic rhinitis due to pollen: Secondary | ICD-10-CM | POA: Diagnosis not present

## 2014-12-27 DIAGNOSIS — J3089 Other allergic rhinitis: Secondary | ICD-10-CM | POA: Diagnosis not present

## 2015-01-09 DIAGNOSIS — J301 Allergic rhinitis due to pollen: Secondary | ICD-10-CM | POA: Diagnosis not present

## 2015-01-09 DIAGNOSIS — J3089 Other allergic rhinitis: Secondary | ICD-10-CM | POA: Diagnosis not present

## 2015-01-16 DIAGNOSIS — J3089 Other allergic rhinitis: Secondary | ICD-10-CM | POA: Diagnosis not present

## 2015-01-16 DIAGNOSIS — J301 Allergic rhinitis due to pollen: Secondary | ICD-10-CM | POA: Diagnosis not present

## 2015-01-16 DIAGNOSIS — J3081 Allergic rhinitis due to animal (cat) (dog) hair and dander: Secondary | ICD-10-CM | POA: Diagnosis not present

## 2015-01-23 DIAGNOSIS — J3081 Allergic rhinitis due to animal (cat) (dog) hair and dander: Secondary | ICD-10-CM | POA: Diagnosis not present

## 2015-01-23 DIAGNOSIS — J3089 Other allergic rhinitis: Secondary | ICD-10-CM | POA: Diagnosis not present

## 2015-01-23 DIAGNOSIS — J301 Allergic rhinitis due to pollen: Secondary | ICD-10-CM | POA: Diagnosis not present

## 2015-01-24 DIAGNOSIS — F329 Major depressive disorder, single episode, unspecified: Secondary | ICD-10-CM | POA: Diagnosis not present

## 2015-01-24 DIAGNOSIS — M810 Age-related osteoporosis without current pathological fracture: Secondary | ICD-10-CM | POA: Diagnosis not present

## 2015-01-24 DIAGNOSIS — J309 Allergic rhinitis, unspecified: Secondary | ICD-10-CM | POA: Diagnosis not present

## 2015-01-24 DIAGNOSIS — E039 Hypothyroidism, unspecified: Secondary | ICD-10-CM | POA: Diagnosis not present

## 2015-01-24 DIAGNOSIS — J45909 Unspecified asthma, uncomplicated: Secondary | ICD-10-CM | POA: Diagnosis not present

## 2015-01-30 DIAGNOSIS — J3089 Other allergic rhinitis: Secondary | ICD-10-CM | POA: Diagnosis not present

## 2015-01-30 DIAGNOSIS — J3081 Allergic rhinitis due to animal (cat) (dog) hair and dander: Secondary | ICD-10-CM | POA: Diagnosis not present

## 2015-01-30 DIAGNOSIS — J301 Allergic rhinitis due to pollen: Secondary | ICD-10-CM | POA: Diagnosis not present

## 2015-02-06 DIAGNOSIS — J301 Allergic rhinitis due to pollen: Secondary | ICD-10-CM | POA: Diagnosis not present

## 2015-02-06 DIAGNOSIS — J3089 Other allergic rhinitis: Secondary | ICD-10-CM | POA: Diagnosis not present

## 2015-02-13 DIAGNOSIS — J301 Allergic rhinitis due to pollen: Secondary | ICD-10-CM | POA: Diagnosis not present

## 2015-02-13 DIAGNOSIS — J3089 Other allergic rhinitis: Secondary | ICD-10-CM | POA: Diagnosis not present

## 2015-02-16 ENCOUNTER — Telehealth: Payer: Self-pay | Admitting: Adult Health

## 2015-02-16 NOTE — Telephone Encounter (Signed)
Lm on vm to cb and sch w/ cory

## 2015-02-16 NOTE — Telephone Encounter (Signed)
Pt has been scheduled.  °

## 2015-02-21 DIAGNOSIS — J3081 Allergic rhinitis due to animal (cat) (dog) hair and dander: Secondary | ICD-10-CM | POA: Diagnosis not present

## 2015-02-21 DIAGNOSIS — J301 Allergic rhinitis due to pollen: Secondary | ICD-10-CM | POA: Diagnosis not present

## 2015-02-21 DIAGNOSIS — J3089 Other allergic rhinitis: Secondary | ICD-10-CM | POA: Diagnosis not present

## 2015-02-28 DIAGNOSIS — J3081 Allergic rhinitis due to animal (cat) (dog) hair and dander: Secondary | ICD-10-CM | POA: Diagnosis not present

## 2015-02-28 DIAGNOSIS — J3089 Other allergic rhinitis: Secondary | ICD-10-CM | POA: Diagnosis not present

## 2015-02-28 DIAGNOSIS — J301 Allergic rhinitis due to pollen: Secondary | ICD-10-CM | POA: Diagnosis not present

## 2015-03-06 DIAGNOSIS — Z23 Encounter for immunization: Secondary | ICD-10-CM | POA: Diagnosis not present

## 2015-03-06 DIAGNOSIS — F329 Major depressive disorder, single episode, unspecified: Secondary | ICD-10-CM | POA: Diagnosis not present

## 2015-03-07 DIAGNOSIS — M2011 Hallux valgus (acquired), right foot: Secondary | ICD-10-CM | POA: Diagnosis not present

## 2015-03-07 DIAGNOSIS — M2012 Hallux valgus (acquired), left foot: Secondary | ICD-10-CM | POA: Diagnosis not present

## 2015-03-08 DIAGNOSIS — J3089 Other allergic rhinitis: Secondary | ICD-10-CM | POA: Diagnosis not present

## 2015-03-08 DIAGNOSIS — J301 Allergic rhinitis due to pollen: Secondary | ICD-10-CM | POA: Diagnosis not present

## 2015-03-08 DIAGNOSIS — J3081 Allergic rhinitis due to animal (cat) (dog) hair and dander: Secondary | ICD-10-CM | POA: Diagnosis not present

## 2015-03-15 DIAGNOSIS — J3089 Other allergic rhinitis: Secondary | ICD-10-CM | POA: Diagnosis not present

## 2015-03-15 DIAGNOSIS — J301 Allergic rhinitis due to pollen: Secondary | ICD-10-CM | POA: Diagnosis not present

## 2015-03-15 DIAGNOSIS — J3081 Allergic rhinitis due to animal (cat) (dog) hair and dander: Secondary | ICD-10-CM | POA: Diagnosis not present

## 2015-03-22 DIAGNOSIS — J3081 Allergic rhinitis due to animal (cat) (dog) hair and dander: Secondary | ICD-10-CM | POA: Diagnosis not present

## 2015-03-22 DIAGNOSIS — J301 Allergic rhinitis due to pollen: Secondary | ICD-10-CM | POA: Diagnosis not present

## 2015-03-22 DIAGNOSIS — J3089 Other allergic rhinitis: Secondary | ICD-10-CM | POA: Diagnosis not present

## 2015-03-29 DIAGNOSIS — J3081 Allergic rhinitis due to animal (cat) (dog) hair and dander: Secondary | ICD-10-CM | POA: Diagnosis not present

## 2015-03-29 DIAGNOSIS — J3089 Other allergic rhinitis: Secondary | ICD-10-CM | POA: Diagnosis not present

## 2015-03-29 DIAGNOSIS — J301 Allergic rhinitis due to pollen: Secondary | ICD-10-CM | POA: Diagnosis not present

## 2015-04-04 DIAGNOSIS — J301 Allergic rhinitis due to pollen: Secondary | ICD-10-CM | POA: Diagnosis not present

## 2015-04-04 DIAGNOSIS — J3089 Other allergic rhinitis: Secondary | ICD-10-CM | POA: Diagnosis not present

## 2015-04-04 DIAGNOSIS — J3081 Allergic rhinitis due to animal (cat) (dog) hair and dander: Secondary | ICD-10-CM | POA: Diagnosis not present

## 2015-04-05 ENCOUNTER — Ambulatory Visit (INDEPENDENT_AMBULATORY_CARE_PROVIDER_SITE_OTHER): Payer: Medicare Other | Admitting: Adult Health

## 2015-04-05 ENCOUNTER — Encounter: Payer: Self-pay | Admitting: Adult Health

## 2015-04-05 VITALS — BP 108/60 | HR 87 | Temp 98.3°F | Ht 63.0 in | Wt 117.5 lb

## 2015-04-05 DIAGNOSIS — Z7689 Persons encountering health services in other specified circumstances: Secondary | ICD-10-CM

## 2015-04-05 DIAGNOSIS — E039 Hypothyroidism, unspecified: Secondary | ICD-10-CM | POA: Insufficient documentation

## 2015-04-05 DIAGNOSIS — Z7189 Other specified counseling: Secondary | ICD-10-CM | POA: Diagnosis not present
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
## 2015-04-05 NOTE — Progress Notes (Signed)
Pre visit review using our clinic review tool, if applicable. No additional management support is needed unless otherwise documented below in the visit note. 

## 2015-04-05 NOTE — Patient Instructions (Signed)
It was a pleasure meeting you today!  Please follow up with me in January for your Wellness Exam.   If you need anything before then, please do not hesitate to let me know.

## 2015-04-06 DIAGNOSIS — J3089 Other allergic rhinitis: Secondary | ICD-10-CM | POA: Diagnosis not present

## 2015-04-06 DIAGNOSIS — J301 Allergic rhinitis due to pollen: Secondary | ICD-10-CM | POA: Diagnosis not present

## 2015-04-06 DIAGNOSIS — J3081 Allergic rhinitis due to animal (cat) (dog) hair and dander: Secondary | ICD-10-CM | POA: Diagnosis not present

## 2015-04-10 DIAGNOSIS — J301 Allergic rhinitis due to pollen: Secondary | ICD-10-CM | POA: Diagnosis not present

## 2015-04-10 DIAGNOSIS — J3089 Other allergic rhinitis: Secondary | ICD-10-CM | POA: Diagnosis not present

## 2015-04-10 DIAGNOSIS — J3081 Allergic rhinitis due to animal (cat) (dog) hair and dander: Secondary | ICD-10-CM | POA: Diagnosis not present

## 2015-04-13 DIAGNOSIS — J301 Allergic rhinitis due to pollen: Secondary | ICD-10-CM | POA: Diagnosis not present

## 2015-04-13 DIAGNOSIS — J3081 Allergic rhinitis due to animal (cat) (dog) hair and dander: Secondary | ICD-10-CM | POA: Diagnosis not present

## 2015-04-13 DIAGNOSIS — J3089 Other allergic rhinitis: Secondary | ICD-10-CM | POA: Diagnosis not present

## 2015-04-18 DIAGNOSIS — J301 Allergic rhinitis due to pollen: Secondary | ICD-10-CM | POA: Diagnosis not present

## 2015-04-18 DIAGNOSIS — J3089 Other allergic rhinitis: Secondary | ICD-10-CM | POA: Diagnosis not present

## 2015-04-18 DIAGNOSIS — J3081 Allergic rhinitis due to animal (cat) (dog) hair and dander: Secondary | ICD-10-CM | POA: Diagnosis not present

## 2015-04-26 DIAGNOSIS — J301 Allergic rhinitis due to pollen: Secondary | ICD-10-CM | POA: Diagnosis not present

## 2015-04-26 DIAGNOSIS — J3081 Allergic rhinitis due to animal (cat) (dog) hair and dander: Secondary | ICD-10-CM | POA: Diagnosis not present

## 2015-04-26 DIAGNOSIS — J3089 Other allergic rhinitis: Secondary | ICD-10-CM | POA: Diagnosis not present

## 2015-04-30 ENCOUNTER — Encounter: Payer: Self-pay | Admitting: Adult Health

## 2015-04-30 ENCOUNTER — Ambulatory Visit (INDEPENDENT_AMBULATORY_CARE_PROVIDER_SITE_OTHER): Payer: Medicare Other | Admitting: Adult Health

## 2015-04-30 VITALS — BP 120/82 | Temp 98.6°F | Ht 63.0 in | Wt 116.6 lb

## 2015-04-30 DIAGNOSIS — B354 Tinea corporis: Secondary | ICD-10-CM

## 2015-04-30 MED ORDER — CICLOPIROX OLAMINE 0.77 % EX CREA: TOPICAL_CREAM | Freq: Two times a day (BID) | CUTANEOUS | Status: DC

## 2015-04-30 NOTE — Patient Instructions (Addendum)
It was great seeing you again today!  I have sent in a prescription for a anti-fungal cream.   You can also try OTC Lamisil   If no improvement after a week of use, please let me know   Body Ringworm Ringworm (tinea corporis) is a fungal infection of the skin on the body. This infection is not caused by worms, but is actually caused by a fungus. Fungus normally lives on the top of your skin and can be useful. However, in the case of ringworms, the fungus grows out of control and causes a skin infection. It can involve any area of skin on the body and can spread easily from one person to another (contagious). Ringworm is a common problem for children, but it can affect adults as well. Ringworm is also often found in athletes, especially wrestlers who share equipment and mats.  CAUSES  Ringworm of the body is caused by a fungus called dermatophyte. It can spread by:  Touchingother people who are infected.  Touchinginfected pets.  Touching or sharingobjects that have been in contact with the infected person or pet (hats, combs, towels, clothing, sports equipment). SYMPTOMS   Itchy, raised red spots and bumps on the skin.  Ring-shaped rash.  Redness near the border of the rash with a clear center.  Dry and scaly skin on or around the rash. Not every person develops a ring-shaped rash. Some develop only the red, scaly patches. DIAGNOSIS  Most often, ringworm can be diagnosed by performing a skin exam. Your caregiver may choose to take a skin scraping from the affected area. The sample will be examined under the microscope to see if the fungus is present.  TREATMENT  Body ringworm may be treated with a topical antifungal cream or ointment. Sometimes, an antifungal shampoo that can be used on your body is prescribed. You may be prescribed antifungal medicines to take by mouth if your ringworm is severe, keeps coming back, or lasts a long time.  HOME CARE INSTRUCTIONS   Only take  over-the-counter or prescription medicines as directed by your caregiver.  Wash the infected area and dry it completely before applying yourcream or ointment.  When using antifungal shampoo to treat the ringworm, leave the shampoo on the body for 3-5 minutes before rinsing.   Wear loose clothing to stop clothes from rubbing and irritating the rash.  Wash or change your bed sheets every night while you have the rash.  Have your pet treated by your veterinarian if it has the same infection. To prevent ringworm:   Practice good hygiene.  Wear sandals or shoes in public places and showers.  Do not share personal items with others.  Avoid touching red patches of skin on other people.  Avoid touching pets that have bald spots or wash your hands after doing so. SEEK MEDICAL CARE IF:   Your rash continues to spread after 7 days of treatment.  Your rash is not gone in 4 weeks.  The area around your rash becomes red, warm, tender, and swollen.   This information is not intended to replace advice given to you by your health care provider. Make sure you discuss any questions you have with your health care provider.   Document Released: 06/20/2000 Document Revised: 03/17/2012 Document Reviewed: 01/05/2012 Elsevier Interactive Patient Education [employer].

## 2015-04-30 NOTE — Progress Notes (Signed)
Pre visit review using our clinic review tool, if applicable. No additional management support is needed unless otherwise documented below in the visit note. 

## 2015-04-30 NOTE — Progress Notes (Signed)
Subjective:    Patient ID: Jennifer Deleon, female    DOB: 1945/09/09, 69 y.o.   MRN: [id number]  HPI  69 year old female who presents to the office today for possible ring worm infection on her lower extremities. She has had round, itchy, red spots on her legs for the last month. Has been putting hydrocortisone cream on the areas, this has not caused a resolution.    Review of Systems  Constitutional: Negative.   Skin: Positive for rash. Negative for color change, pallor and wound.  Allergic/Immunologic: Negative.   Neurological: Negative.   All other systems reviewed and are negative.  Past Medical History  Diagnosis Date  . Osteoporosis   . Environmental allergies     allergies all year long  . Chicken pox   . Hypothyroid   . Colon polyps   . UTI (lower urinary tract infection)   . Depression   . Asthma   . Arthritis   . Colon polyps     Social History   Social History  . Marital Status: Married    Spouse Name: N/A  . Number of Children: N/A  . Years of Education: N/A   Occupational History  . Not on file.   Social History Main Topics  . Smoking status: Never Smoker   . Smokeless tobacco: Never Used  . Alcohol Use: 4.2 oz/week    7 Glasses of wine per week     Comment: a glass of wine with dinner each night  . Drug Use: No  . Sexual Activity: Not on file   Other Topics Concern  . Not on file   Social History Narrative   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ighter    Married for  11 years    [place] who live in [place]          Past Surgical History  Procedure Laterality Date  . Tubal ligation    . Lumbar fusion  2015    l4-L5  . Laminectomy    . Cataract extraction Bilateral 2016    Family History  Problem Relation Age of Onset  . Osteoarthritis Mother   . Osteoarthritis Maternal Grandmother   . Osteoarthritis Father   . Cancer Paternal Grandfather     Prostate Cancer mets to colon  . Hypertension Father   . Heart failure Father   . Arthritis      . Prostate cancer    . Mental illness      Allergies  Allergen Reactions  . Augmentin [Amoxicillin-Pot Clavulanate] Diarrhea and Other (See Comments)    Severe Stomach cramps  . Bactrim [Sulfamethoxazole-Trimethoprim] Swelling    Facial and lip swelling    Current Outpatient Prescriptions on File Prior to Visit  Medication Sig Dispense Refill  . albuterol (PROVENTIL HFA;VENTOLIN HFA) 108 (90 BASE) MCG/ACT inhaler Inhale 2 puffs into the lungs every 6 (six) hours as needed for wheezing or shortness of breath.    Marland Kitchen azelastine (ASTELIN) 0.1 % nasal spray Place 1 spray into both nostrils 2 (two) times daily. Use in each nostril as directed    . beclomethasone (QVAR) 40 MCG/ACT inhaler Inhale 2 puffs into the lungs 2 (two) times daily.    Marland Kitchen buPROPion (WELLBUTRIN XL) 300 MG 24 hr tablet Take 300 mg by mouth every morning.    . calcium-vitamin D (OSCAL-500) 500-400 MG-UNIT per tablet Take 1 tablet by mouth 2 (two) times daily with a meal.    . cetirizine (ZYRTEC) 10 MG tablet Take  10 mg by mouth every morning.    . diphenhydrAMINE (BENADRYL) 25 MG tablet Take 25 mg by mouth every 6 (six) hours as needed for allergies.    Marland Kitchen docusate sodium (COLACE) 100 MG capsule Take 100 mg by mouth at bedtime.    . fexofenadine (ALLER-EASE) 180 MG tablet Take 180 mg by mouth every morning.    Marland Kitchen FLUoxetine (PROZAC) 40 MG capsule Take 40 mg by mouth every morning.    . fluticasone (FLONASE) 50 MCG/ACT nasal spray Place into both nostrils daily.    Marland Kitchen gabapentin (NEURONTIN) 300 MG capsule Take 300 mg by mouth at bedtime.    Marland Kitchen ibuprofen (ADVIL,MOTRIN) 200 MG tablet Take 200-800 mg by mouth every 6 (six) hours as needed for pain.    Marland Kitchen levothyroxine (SYNTHROID, LEVOTHROID) 125 MCG tablet Take 125 mcg by mouth daily before breakfast.    . montelukast (SINGULAIR) 10 MG tablet Take 10 mg by mouth at bedtime.    . Multiple Vitamin (MULTIVITAMIN WITH MINERALS) TABS Take 1 tablet by mouth every morning.    Marland Kitchen olopatadine  (PATANOL) 0.1 % ophthalmic solution 1 drop 2 (two) times daily.     No current facility-administered medications on file prior to visit.    BP 120/82 mmHg  Temp(Src) 98.6 F (37 C) (Oral)  Ht 5\' 3"  (1.6 m)  Wt 116 lb 9.6 oz (52.889 kg)  BMI 20.66 kg/m2       Objective:   Physical Exam  Constitutional: She is oriented to person, place, and time. She appears well-developed and well-nourished. No distress.  Musculoskeletal: Normal range of motion. She exhibits no edema or tenderness.  Neurological: She is alert and oriented to person, place, and time.  Skin: Skin is warm and dry. Rash (multiple red, circular areas on bilateral legs. Appears as ring worm) noted. She is not diaphoretic. No erythema. No pallor.  Psychiatric: She has a normal mood and affect. Her behavior is normal. Judgment and thought content normal.  Nursing note and vitals reviewed.     Assessment & Plan:  1. Tinea corporis - ciclopirox (CICLODAN) 0.77 % cream; Apply topically 2 (two) times daily.  Dispense: 15 g; Refill: 0 - Can also use OTC anti fungal - Follow up if no improvement - Consider oral therapy.

## 2015-05-03 ENCOUNTER — Ambulatory Visit
Admission: RE | Admit: 2015-05-03 | Discharge: 2015-05-03 | Disposition: A | Discharge status: Home / Self Care | Payer: Medicare Other | Source: Ambulatory Visit | Attending: Allergy and Immunology | Admitting: Allergy and Immunology

## 2015-05-03 ENCOUNTER — Other Ambulatory Visit: Payer: Self-pay | Admitting: Allergy and Immunology

## 2015-05-03 DIAGNOSIS — R05 Cough: Secondary | ICD-10-CM

## 2015-05-03 DIAGNOSIS — J4530 Mild persistent asthma, uncomplicated: Secondary | ICD-10-CM | POA: Diagnosis not present

## 2015-05-03 DIAGNOSIS — J3081 Allergic rhinitis due to animal (cat) (dog) hair and dander: Secondary | ICD-10-CM | POA: Diagnosis not present

## 2015-05-03 DIAGNOSIS — R059 Cough, unspecified: Secondary | ICD-10-CM

## 2015-05-03 DIAGNOSIS — J3089 Other allergic rhinitis: Secondary | ICD-10-CM | POA: Diagnosis not present

## 2015-05-03 DIAGNOSIS — J301 Allergic rhinitis due to pollen: Secondary | ICD-10-CM | POA: Diagnosis not present

## 2015-05-10 DIAGNOSIS — J4530 Mild persistent asthma, uncomplicated: Secondary | ICD-10-CM | POA: Diagnosis not present

## 2015-05-10 DIAGNOSIS — J3089 Other allergic rhinitis: Secondary | ICD-10-CM | POA: Diagnosis not present

## 2015-05-10 DIAGNOSIS — J3081 Allergic rhinitis due to animal (cat) (dog) hair and dander: Secondary | ICD-10-CM | POA: Diagnosis not present

## 2015-05-10 DIAGNOSIS — J301 Allergic rhinitis due to pollen: Secondary | ICD-10-CM | POA: Diagnosis not present

## 2015-05-15 DIAGNOSIS — J3081 Allergic rhinitis due to animal (cat) (dog) hair and dander: Secondary | ICD-10-CM | POA: Diagnosis not present

## 2015-05-15 DIAGNOSIS — J301 Allergic rhinitis due to pollen: Secondary | ICD-10-CM | POA: Diagnosis not present

## 2015-05-15 DIAGNOSIS — J3089 Other allergic rhinitis: Secondary | ICD-10-CM | POA: Diagnosis not present

## 2015-05-24 DIAGNOSIS — J301 Allergic rhinitis due to pollen: Secondary | ICD-10-CM | POA: Diagnosis not present

## 2015-05-24 DIAGNOSIS — J3081 Allergic rhinitis due to animal (cat) (dog) hair and dander: Secondary | ICD-10-CM | POA: Diagnosis not present

## 2015-05-24 DIAGNOSIS — J3089 Other allergic rhinitis: Secondary | ICD-10-CM | POA: Diagnosis not present

## 2015-05-29 DIAGNOSIS — J3081 Allergic rhinitis due to animal (cat) (dog) hair and dander: Secondary | ICD-10-CM | POA: Diagnosis not present

## 2015-05-29 DIAGNOSIS — J301 Allergic rhinitis due to pollen: Secondary | ICD-10-CM | POA: Diagnosis not present

## 2015-05-29 DIAGNOSIS — J3089 Other allergic rhinitis: Secondary | ICD-10-CM | POA: Diagnosis not present

## 2015-06-06 DIAGNOSIS — J301 Allergic rhinitis due to pollen: Secondary | ICD-10-CM | POA: Diagnosis not present

## 2015-06-06 DIAGNOSIS — J3089 Other allergic rhinitis: Secondary | ICD-10-CM | POA: Diagnosis not present

## 2015-06-06 DIAGNOSIS — J3081 Allergic rhinitis due to animal (cat) (dog) hair and dander: Secondary | ICD-10-CM | POA: Diagnosis not present

## 2015-06-13 ENCOUNTER — Ambulatory Visit (INDEPENDENT_AMBULATORY_CARE_PROVIDER_SITE_OTHER): Payer: Medicare Other | Admitting: Adult Health

## 2015-06-13 ENCOUNTER — Encounter: Payer: Self-pay | Admitting: Adult Health

## 2015-06-13 VITALS — BP 110/70 | HR 60 | Temp 98.6°F | Wt 116.0 lb

## 2015-06-13 DIAGNOSIS — J3081 Allergic rhinitis due to animal (cat) (dog) hair and dander: Secondary | ICD-10-CM | POA: Diagnosis not present

## 2015-06-13 DIAGNOSIS — J301 Allergic rhinitis due to pollen: Secondary | ICD-10-CM | POA: Diagnosis not present

## 2015-06-13 DIAGNOSIS — B354 Tinea corporis: Secondary | ICD-10-CM

## 2015-06-13 DIAGNOSIS — J3089 Other allergic rhinitis: Secondary | ICD-10-CM | POA: Diagnosis not present

## 2015-06-13 NOTE — Progress Notes (Signed)
Subjective:    Patient ID: Jennifer Deleon, female    DOB: December 29, 1945, 69 y.o.   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 Last saw her for this on 04/30/2015 and had prescribed ciclopirox (CICLODAN) 0.77 % cream. She was advised that she could also use OTC Lamisil. She opted to try OTC Lamisil. Endorses that the infestation was almost resolved but then came back. She has not tried ciclopirox  Review of Systems  Constitutional: Negative.   HENT: Negative.   Skin: Positive for rash (three small red, round patches on bilateral lower extremities). Negative for color change, pallor and wound.  All other systems reviewed and are negative.  Past Medical History  Diagnosis Date  . Osteoporosis   . Environmental allergies     allergies all year long  . Chicken pox   . Hypothyroid   . Colon polyps   . UTI (lower urinary tract infection)   . Depression   . Asthma   . Arthritis   . Colon polyps     Social History   Social History  . Marital Status: Married    Spouse Name: N/A  . Number of Children: N/A  . Years of Education: N/A   Occupational History  . Not on file.   Social History Main Topics  . Smoking status: Never Smoker   . Smokeless tobacco: Never Used  . Alcohol Use: 4.2 oz/week    7 Glasses of wine per week     Comment: a glass of wine with dinner each night  . Drug Use: No  . Sexual Activity: Not on file   Other Topics Concern  . Not on file   Social History Narrative   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    Married for  11 years    [place] who live in [place]          Past Surgical History  Procedure Laterality Date  . Tubal ligation    . Lumbar fusion  2015    l4-L5  . Laminectomy    . Cataract extraction Bilateral 2016    Family History  Problem Relation Age of Onset  . Osteoarthritis Mother   . Osteoarthritis Maternal Grandmother   .  Osteoarthritis Father   . Cancer Paternal Grandfather     Prostate Cancer mets to colon  . Hypertension Father   . Heart failure Father   . Arthritis    . Prostate cancer    . Mental illness      Allergies  Allergen Reactions  . Augmentin [Amoxicillin-Pot Clavulanate] Diarrhea and Other (See Comments)    Severe Stomach cramps  . Bactrim [Sulfamethoxazole-Trimethoprim] Swelling    Facial and lip swelling    Current Outpatient Prescriptions on File Prior to Visit  Medication Sig Dispense Refill  . albuterol (PROVENTIL HFA;VENTOLIN HFA) 108 (90 BASE) MCG/ACT inhaler Inhale 2 puffs into the lungs every 6 (six) hours as needed for wheezing or shortness of breath.    Marland Kitchen azelastine (ASTELIN) 0.1 % nasal spray Place 1 spray into both nostrils 2 (two) times daily. Use in each nostril as directed    . beclomethasone (QVAR) 40 MCG/ACT inhaler Inhale 2 puffs into the lungs 2 (two) times daily.    Marland Kitchen buPROPion (WELLBUTRIN XL) 300 MG 24 hr tablet Take 300 mg by mouth every morning.    . calcium-vitamin D (OSCAL-500) 500-400 MG-UNIT per  tablet Take 1 tablet by mouth 2 (two) times daily with a meal.    . cetirizine (ZYRTEC) 10 MG tablet Take 10 mg by mouth every morning.    . ciclopirox (CICLODAN) 0.77 % cream Apply topically 2 (two) times daily. 15 g 0  . diphenhydrAMINE (BENADRYL) 25 MG tablet Take 25 mg by mouth every 6 (six) hours as needed for allergies.    Marland Kitchen docusate sodium (COLACE) 100 MG capsule Take 100 mg by mouth at bedtime.    . fexofenadine (ALLER-EASE) 180 MG tablet Take 180 mg by mouth every morning.    Marland Kitchen FLUoxetine (PROZAC) 40 MG capsule Take 40 mg by mouth every morning.    . fluticasone (FLONASE) 50 MCG/ACT nasal spray Place into both nostrils daily.    Marland Kitchen gabapentin (NEURONTIN) 300 MG capsule Take 300 mg by mouth at bedtime.    Marland Kitchen ibuprofen (ADVIL,MOTRIN) 200 MG tablet Take 200-800 mg by mouth every 6 (six) hours as needed for pain.    Marland Kitchen levothyroxine (SYNTHROID, LEVOTHROID) 125 MCG  tablet Take 125 mcg by mouth daily before breakfast.    . montelukast (SINGULAIR) 10 MG tablet Take 10 mg by mouth at bedtime.    . Multiple Vitamin (MULTIVITAMIN WITH MINERALS) TABS Take 1 tablet by mouth every morning.    Marland Kitchen olopatadine (PATANOL) 0.1 % ophthalmic solution 1 drop 2 (two) times daily.     No current facility-administered medications on file prior to visit.    BP 110/70 mmHg  Pulse 60  Temp(Src) 98.6 F (37 C) (Oral)  Wt 116 lb (52.617 kg)       Objective:   Physical Exam Constitutional: She is oriented to person, place, and time. She appears well-developed and well-nourished. No distress.  Musculoskeletal: Normal range of motion. She exhibits no edema or tenderness.  Neurological: She is alert and oriented to person, place, and time.  Skin: Skin is warm and dry. Rash (multiple red, circular areas on bilateral legs. Appears as ring worm) noted. She is not diaphoretic. No erythema. No pallor.  Psychiatric: She has a normal mood and affect. Her behavior is normal. Judgment and thought content normal.  Nursing note and vitals reviewed.     Assessment & Plan:  1. Tinea corporis - Use previously prescribed ciclopirox (CICLODAN) 0.77 % cream; Apply topically 2 (two) times daily. Dispense: 15 g; Refill: 0 - Follow up if rash has not resolved in the next 2 weeks.

## 2015-06-13 NOTE — Progress Notes (Signed)
Pre visit review using our clinic review tool, if applicable. No additional management support is needed unless otherwise documented below in the visit note. 

## 2015-06-19 ENCOUNTER — Telehealth: Payer: Self-pay | Admitting: Adult Health

## 2015-06-19 NOTE — Telephone Encounter (Signed)
error 

## 2015-06-22 DIAGNOSIS — J301 Allergic rhinitis due to pollen: Secondary | ICD-10-CM | POA: Diagnosis not present

## 2015-06-22 DIAGNOSIS — J3089 Other allergic rhinitis: Secondary | ICD-10-CM | POA: Diagnosis not present

## 2015-06-22 DIAGNOSIS — J3081 Allergic rhinitis due to animal (cat) (dog) hair and dander: Secondary | ICD-10-CM | POA: Diagnosis not present

## 2015-06-25 ENCOUNTER — Telehealth: Payer: Self-pay | Admitting: Adult Health

## 2015-06-25 DIAGNOSIS — B354 Tinea corporis: Secondary | ICD-10-CM

## 2015-06-25 NOTE — Telephone Encounter (Signed)
Patient said her ring worm has not improved on the medication that was prescribed a week ago and want to know what to do at this point.

## 2015-06-26 NOTE — Telephone Encounter (Signed)
We could try an oral steroid or put a referral into dermatology

## 2015-06-26 NOTE — Telephone Encounter (Signed)
Left a message for return call.  

## 2015-06-26 NOTE — Telephone Encounter (Signed)
Called and spoke with pt and pt is aware.  Pt would like a referral to dermatology. Pt did not want to try steroids at this time.  Referral placed and pt is aware.

## 2015-06-28 DIAGNOSIS — J3081 Allergic rhinitis due to animal (cat) (dog) hair and dander: Secondary | ICD-10-CM | POA: Diagnosis not present

## 2015-06-28 DIAGNOSIS — J301 Allergic rhinitis due to pollen: Secondary | ICD-10-CM | POA: Diagnosis not present

## 2015-06-28 DIAGNOSIS — J3089 Other allergic rhinitis: Secondary | ICD-10-CM | POA: Diagnosis not present

## 2015-07-10 ENCOUNTER — Encounter: Payer: Medicare Other | Admitting: Adult Health

## 2015-07-11 ENCOUNTER — Other Ambulatory Visit: Payer: Self-pay

## 2015-07-11 ENCOUNTER — Encounter: Payer: Self-pay | Admitting: Adult Health

## 2015-07-11 DIAGNOSIS — B354 Tinea corporis: Secondary | ICD-10-CM

## 2015-07-11 DIAGNOSIS — J3081 Allergic rhinitis due to animal (cat) (dog) hair and dander: Secondary | ICD-10-CM | POA: Diagnosis not present

## 2015-07-11 DIAGNOSIS — J3089 Other allergic rhinitis: Secondary | ICD-10-CM | POA: Diagnosis not present

## 2015-07-11 DIAGNOSIS — J301 Allergic rhinitis due to pollen: Secondary | ICD-10-CM | POA: Diagnosis not present

## 2015-07-16 ENCOUNTER — Ambulatory Visit: Payer: Medicare Other | Admitting: Adult Health

## 2015-07-19 DIAGNOSIS — J3081 Allergic rhinitis due to animal (cat) (dog) hair and dander: Secondary | ICD-10-CM | POA: Diagnosis not present

## 2015-07-19 DIAGNOSIS — J3089 Other allergic rhinitis: Secondary | ICD-10-CM | POA: Diagnosis not present

## 2015-07-19 DIAGNOSIS — J301 Allergic rhinitis due to pollen: Secondary | ICD-10-CM | POA: Diagnosis not present

## 2015-07-25 DIAGNOSIS — J3081 Allergic rhinitis due to animal (cat) (dog) hair and dander: Secondary | ICD-10-CM | POA: Diagnosis not present

## 2015-07-25 DIAGNOSIS — J301 Allergic rhinitis due to pollen: Secondary | ICD-10-CM | POA: Diagnosis not present

## 2015-07-25 DIAGNOSIS — J3089 Other allergic rhinitis: Secondary | ICD-10-CM | POA: Diagnosis not present

## 2015-08-02 ENCOUNTER — Telehealth: Payer: Self-pay | Admitting: Adult Health

## 2015-08-02 NOTE — Telephone Encounter (Signed)
Please advise 

## 2015-08-02 NOTE — Telephone Encounter (Signed)
Patient Name: Jennifer Deleon DOB: August 05, 1945 Initial Comment Caller states on vacation and broke her wrist Nurse Assessment Nurse: Vallery Sa, RN, Cathy Date/Time (Eastern Time): 08/02/2015 8:51:55 AM Confirm and document reason for call. If symptomatic, describe symptoms. You must click the next button to save text entered. ---Caller states she broke her right wrist two days ago. They are out of the country and will be returning Saturday. No fever. She has the arm in a splint. No bleeding. Has the patient traveled out of the country within the last 30 days? ---Yes Where have you traveled? ([place] for Ebola and Ebola guideline, [place], [place] for MERS) ---[place] Does the patient have any new or worsening symptoms? ---Yes Will a triage be completed? ---Yes Related visit to physician within the last 2 weeks? ---No Does the PT have any chronic conditions? (i.e. diabetes, asthma, etc.) ---Yes List chronic conditions. ---Allergies, Osteoporosis, Arthritis, Hypothyroidism Is this a behavioral health or substance abuse call? ---No Guidelines Guideline Title Affirmed Question Affirmed Notes Splint Symptoms and Questions [1] Fingers or toes are swollen (compared to noninjured side) AND [2] persists > 24 hours after loosening elastic bandage Final Disposition User See PCP When Office is Open (within 3 days) Vallery Sa, RN, [place] Comments: Jennifer Deleon is out of the country and will be back on Saturday. She requests that the MD or MD's nurse call her back regarding plans for surgery once she returns. Called the office and notified [name]. Referrals GO TO FACILITY OTHER - SPECIFY Disagree/Comply: Comply

## 2015-08-02 NOTE — Telephone Encounter (Signed)
[facility] called stating that the patient is in [place] and she has fallen and broken her wrist. She has went to a small clinic over there and they have her arm in a splint and it is swelling. The patient would like to make an appointment to see the doctor here but would also want to be referred to a doctor that will help her with her wrist. [facility] is sending over a message also. The patient would liked to be called at [phone] and stated that sometimes she doesn't get a good signal and will call back when she gets better signal.

## 2015-08-03 NOTE — Telephone Encounter (Signed)
Try calling Jennifer Deleon to see when she will get back . Thank you

## 2015-08-03 NOTE — Telephone Encounter (Signed)
Left a message for return call.  

## 2015-08-03 NOTE — Telephone Encounter (Signed)
Called and spoke with pt and pt states she has been in a lot of pain since falling in [place]. Pt was told by the surgeon there that she needed surgery.  Pt was not given her x-rays and her after visit summary is in [place].  Pt will get home late tonight.  Advised pt that Raliegh Ip had an orthopedic urgent care that is opened on Saturday from 9 am to 2 pm and Sunday from 10 am to 2 pm.  Pt verbalized understanding and states she does not want to wait to see Tommi Rumps until Monday due to pain.  Pt has a few pain pills left and will take those to help with pain until being seen. Per pt's request called pt back and left the information for the urgent care on her voicemail.

## 2015-08-03 NOTE — Telephone Encounter (Signed)
Called and spoke with pt and pt states she has been in a lot of pain since falling in [place]. Pt was told by the surgeon there that she needed surgery.  Pt was not given her x-rays and her after visit summary is in [employer].  Pt will get home late tonight.  Advised pt that Raliegh Ip had an orthopedic urgent care that is opened on Saturday from 9 am to 2 pm and Sunday from 10 am to 2 pm.  Pt verbalized understanding and states she does not want to wait to see Tommi Rumps until Monday due to pain.  Pt has a few pain pills left and will take those to help with pain until being seen. Per pt's request called pt back and left the information for the urgent care on her voicemail.

## 2015-08-04 DIAGNOSIS — S52501A Unspecified fracture of the lower end of right radius, initial encounter for closed fracture: Secondary | ICD-10-CM | POA: Diagnosis not present

## 2015-08-06 DIAGNOSIS — S52501D Unspecified fracture of the lower end of right radius, subsequent encounter for closed fracture with routine healing: Secondary | ICD-10-CM | POA: Diagnosis not present

## 2015-08-07 ENCOUNTER — Encounter: Payer: Medicare Other | Admitting: Adult Health

## 2015-08-07 DIAGNOSIS — L309 Dermatitis, unspecified: Secondary | ICD-10-CM | POA: Diagnosis not present

## 2015-08-08 DIAGNOSIS — J3081 Allergic rhinitis due to animal (cat) (dog) hair and dander: Secondary | ICD-10-CM | POA: Diagnosis not present

## 2015-08-08 DIAGNOSIS — J3089 Other allergic rhinitis: Secondary | ICD-10-CM | POA: Diagnosis not present

## 2015-08-08 DIAGNOSIS — J301 Allergic rhinitis due to pollen: Secondary | ICD-10-CM | POA: Diagnosis not present

## 2015-08-09 DIAGNOSIS — Y929 Unspecified place or not applicable: Secondary | ICD-10-CM | POA: Diagnosis not present

## 2015-08-09 DIAGNOSIS — G8918 Other acute postprocedural pain: Secondary | ICD-10-CM | POA: Diagnosis not present

## 2015-08-09 DIAGNOSIS — Y939 Activity, unspecified: Secondary | ICD-10-CM | POA: Diagnosis not present

## 2015-08-09 DIAGNOSIS — S52571A Other intraarticular fracture of lower end of right radius, initial encounter for closed fracture: Secondary | ICD-10-CM | POA: Diagnosis not present

## 2015-08-09 DIAGNOSIS — Y999 Unspecified external cause status: Secondary | ICD-10-CM | POA: Diagnosis not present

## 2015-08-09 DIAGNOSIS — S52501A Unspecified fracture of the lower end of right radius, initial encounter for closed fracture: Secondary | ICD-10-CM | POA: Diagnosis not present

## 2015-08-11 ENCOUNTER — Inpatient Hospital Stay (HOSPITAL_COMMUNITY)
Admission: EM | Admit: 2015-08-11 | Discharge: 2015-08-12 | DRG: 185 | Disposition: A | Discharge status: Home / Self Care | Payer: Medicare Other | Attending: Internal Medicine | Admitting: Internal Medicine

## 2015-08-11 ENCOUNTER — Emergency Department (HOSPITAL_COMMUNITY): Payer: Medicare Other

## 2015-08-11 ENCOUNTER — Inpatient Hospital Stay (HOSPITAL_COMMUNITY): Payer: Medicare Other

## 2015-08-11 ENCOUNTER — Encounter (HOSPITAL_COMMUNITY): Payer: Self-pay | Admitting: Emergency Medicine

## 2015-08-11 DIAGNOSIS — S2231XA Fracture of one rib, right side, initial encounter for closed fracture: Secondary | ICD-10-CM

## 2015-08-11 DIAGNOSIS — Z882 Allergy status to sulfonamides status: Secondary | ICD-10-CM | POA: Diagnosis not present

## 2015-08-11 DIAGNOSIS — E038 Other specified hypothyroidism: Secondary | ICD-10-CM

## 2015-08-11 DIAGNOSIS — Z8619 Personal history of other infectious and parasitic diseases: Secondary | ICD-10-CM | POA: Diagnosis not present

## 2015-08-11 DIAGNOSIS — F329 Major depressive disorder, single episode, unspecified: Secondary | ICD-10-CM | POA: Diagnosis present

## 2015-08-11 DIAGNOSIS — J45909 Unspecified asthma, uncomplicated: Secondary | ICD-10-CM | POA: Diagnosis present

## 2015-08-11 DIAGNOSIS — Z86010 Personal history of colonic polyps: Secondary | ICD-10-CM | POA: Diagnosis not present

## 2015-08-11 DIAGNOSIS — S2239XA Fracture of one rib, unspecified side, initial encounter for closed fracture: Secondary | ICD-10-CM | POA: Diagnosis present

## 2015-08-11 DIAGNOSIS — Z8261 Family history of arthritis: Secondary | ICD-10-CM | POA: Diagnosis not present

## 2015-08-11 DIAGNOSIS — Z8249 Family history of ischemic heart disease and other diseases of the circulatory system: Secondary | ICD-10-CM | POA: Diagnosis not present

## 2015-08-11 DIAGNOSIS — S2241XA Multiple fractures of ribs, right side, initial encounter for closed fracture: Secondary | ICD-10-CM | POA: Diagnosis not present

## 2015-08-11 DIAGNOSIS — W010XXA Fall on same level from slipping, tripping and stumbling without subsequent striking against object, initial encounter: Secondary | ICD-10-CM | POA: Diagnosis present

## 2015-08-11 DIAGNOSIS — S2249XA Multiple fractures of ribs, unspecified side, initial encounter for closed fracture: Secondary | ICD-10-CM | POA: Diagnosis present

## 2015-08-11 DIAGNOSIS — E039 Hypothyroidism, unspecified: Secondary | ICD-10-CM | POA: Diagnosis present

## 2015-08-11 DIAGNOSIS — Z79899 Other long term (current) drug therapy: Secondary | ICD-10-CM | POA: Diagnosis not present

## 2015-08-11 DIAGNOSIS — Z881 Allergy status to other antibiotic agents status: Secondary | ICD-10-CM

## 2015-08-11 DIAGNOSIS — Z981 Arthrodesis status: Secondary | ICD-10-CM | POA: Diagnosis not present

## 2015-08-11 DIAGNOSIS — E032 Hypothyroidism due to medicaments and other exogenous substances: Secondary | ICD-10-CM | POA: Diagnosis not present

## 2015-08-11 DIAGNOSIS — Z87440 Personal history of urinary (tract) infections: Secondary | ICD-10-CM

## 2015-08-11 DIAGNOSIS — M1990 Unspecified osteoarthritis, unspecified site: Secondary | ICD-10-CM | POA: Diagnosis present

## 2015-08-11 DIAGNOSIS — R0789 Other chest pain: Secondary | ICD-10-CM | POA: Diagnosis not present

## 2015-08-11 DIAGNOSIS — M810 Age-related osteoporosis without current pathological fracture: Secondary | ICD-10-CM | POA: Diagnosis present

## 2015-08-11 LAB — CBC WITH DIFFERENTIAL/PLATELET
Basophils Absolute: 0 10*3/uL (ref 0.0–0.1)
Basophils Relative: 0 %
Eosinophils Absolute: 0.1 10*3/uL (ref 0.0–0.7)
Eosinophils Relative: 1 %
HCT: 40.2 % (ref 36.0–46.0)
Hemoglobin: 13.2 g/dL (ref 12.0–15.0)
Lymphocytes Relative: 15 %
Lymphs Abs: 1.5 10*3/uL (ref 0.7–4.0)
MCH: 31.4 pg (ref 26.0–34.0)
MCHC: 32.8 g/dL (ref 30.0–36.0)
MCV: 95.5 fL (ref 78.0–100.0)
Monocytes Absolute: 1.4 10*3/uL — ABNORMAL HIGH (ref 0.1–1.0)
Monocytes Relative: 14 %
Neutro Abs: 6.7 10*3/uL (ref 1.7–7.7)
Neutrophils Relative %: 70 %
Platelets: 403 10*3/uL — ABNORMAL HIGH (ref 150–400)
RBC: 4.21 MIL/uL (ref 3.87–5.11)
RDW: 13.2 % (ref 11.5–15.5)
WBC: 9.6 10*3/uL (ref 4.0–10.5)

## 2015-08-11 LAB — BASIC METABOLIC PANEL
Anion gap: 14 (ref 5–15)
BUN: 13 mg/dL (ref 6–20)
CO2: 20 mmol/L — ABNORMAL LOW (ref 22–32)
Calcium: 9.3 mg/dL (ref 8.9–10.3)
Chloride: 100 mmol/L — ABNORMAL LOW (ref 101–111)
Creatinine, Ser: 0.66 mg/dL (ref 0.44–1.00)
GFR calc Af Amer: >60 mL/min (ref >60)
GFR calc non Af Amer: >60 mL/min (ref >60)
Glucose, Bld: 117 mg/dL — ABNORMAL HIGH (ref 65–99)
Potassium: 3.5 mmol/L (ref 3.5–5.1)
Sodium: 134 mmol/L — ABNORMAL LOW (ref 135–145)

## 2015-08-11 LAB — TROPONIN I: Troponin I: <0.03 ng/mL (ref <0.031)

## 2015-08-11 MED ORDER — ACETAMINOPHEN 650 MG RE SUPP: 650.0000 mg | Freq: Four times a day (QID) | RECTAL | Status: DC | PRN

## 2015-08-11 MED ORDER — SODIUM CHLORIDE 0.9% FLUSH
3.0000 mL | Freq: Two times a day (BID) | INTRAVENOUS | Status: DC
  Administered 2015-08-12: 3 mL via INTRAVENOUS

## 2015-08-11 MED ORDER — LORATADINE 10 MG PO TABS
10.0000 mg | ORAL_TABLET | Freq: Every day | ORAL | Status: DC
  Administered 2015-08-11 – 2015-08-12 (×2): 10 mg via ORAL
  Filled 2015-08-11 (×2): qty 1

## 2015-08-11 MED ORDER — MORPHINE SULFATE (PF) 4 MG/ML IV SOLN
4.0000 mg | Freq: Once | INTRAVENOUS | Status: AC
  Administered 2015-08-11: 4 mg via INTRAVENOUS
  Filled 2015-08-11: qty 1

## 2015-08-11 MED ORDER — BECLOMETHASONE DIPROPIONATE 40 MCG/ACT IN AERS
2.0000 | INHALATION_SPRAY | Freq: Two times a day (BID) | RESPIRATORY_TRACT | Status: DC
  Filled 2015-08-11: qty 8.7

## 2015-08-11 MED ORDER — ACETAMINOPHEN 325 MG PO TABS: 650.0000 mg | ORAL_TABLET | Freq: Four times a day (QID) | ORAL | Status: DC | PRN

## 2015-08-11 MED ORDER — AZELASTINE HCL 0.1 % NA SOLN
1.0000 | Freq: Two times a day (BID) | NASAL | Status: DC
  Administered 2015-08-11 – 2015-08-12 (×2): 1 via NASAL
  Filled 2015-08-11: qty 30

## 2015-08-11 MED ORDER — OXYCODONE HCL 5 MG PO TABS
5.0000 mg | ORAL_TABLET | ORAL | Status: DC | PRN
  Administered 2015-08-11 – 2015-08-12 (×2): 5 mg via ORAL
  Filled 2015-08-11 (×2): qty 1

## 2015-08-11 MED ORDER — FLUOXETINE HCL 20 MG PO CAPS
40.0000 mg | ORAL_CAPSULE | Freq: Every morning | ORAL | Status: DC
  Administered 2015-08-12: 40 mg via ORAL
  Filled 2015-08-11: qty 2

## 2015-08-11 MED ORDER — IOHEXOL 350 MG/ML SOLN
100.0000 mL | Freq: Once | INTRAVENOUS | Status: AC | PRN
  Administered 2015-08-11: 100 mL via INTRAVENOUS

## 2015-08-11 MED ORDER — BUPROPION HCL ER (XL) 300 MG PO TB24
300.0000 mg | ORAL_TABLET | Freq: Every morning | ORAL | Status: DC
  Administered 2015-08-12: 300 mg via ORAL
  Filled 2015-08-11: qty 1

## 2015-08-11 MED ORDER — ONDANSETRON HCL 4 MG PO TABS: 4.0000 mg | ORAL_TABLET | Freq: Four times a day (QID) | ORAL | Status: DC | PRN

## 2015-08-11 MED ORDER — DIPHENHYDRAMINE HCL 25 MG PO CAPS: 25.0000 mg | ORAL_CAPSULE | Freq: Four times a day (QID) | ORAL | Status: DC | PRN

## 2015-08-11 MED ORDER — BUDESONIDE 0.25 MG/2ML IN SUSP
0.2500 mg | Freq: Two times a day (BID) | RESPIRATORY_TRACT | Status: DC
  Administered 2015-08-12: 0.25 mg via RESPIRATORY_TRACT
  Filled 2015-08-11 (×2): qty 2

## 2015-08-11 MED ORDER — GABAPENTIN 300 MG PO CAPS
300.0000 mg | ORAL_CAPSULE | Freq: Every day | ORAL | Status: DC
  Administered 2015-08-11: 300 mg via ORAL
  Filled 2015-08-11: qty 1

## 2015-08-11 MED ORDER — POLYETHYLENE GLYCOL 3350 17 G PO PACK
17.0000 g | PACK | Freq: Every day | ORAL | Status: DC | PRN
  Filled 2015-08-11: qty 1

## 2015-08-11 MED ORDER — MONTELUKAST SODIUM 10 MG PO TABS
10.0000 mg | ORAL_TABLET | Freq: Every day | ORAL | Status: DC
  Administered 2015-08-11: 10 mg via ORAL
  Filled 2015-08-11: qty 1

## 2015-08-11 MED ORDER — LEVOTHYROXINE SODIUM 25 MCG PO TABS
125.0000 ug | ORAL_TABLET | Freq: Every day | ORAL | Status: DC
  Administered 2015-08-12: 125 ug via ORAL
  Filled 2015-08-11: qty 1

## 2015-08-11 MED ORDER — ONDANSETRON HCL 4 MG/2ML IJ SOLN: 4.0000 mg | Freq: Four times a day (QID) | INTRAMUSCULAR | Status: DC | PRN

## 2015-08-11 MED ORDER — SODIUM CHLORIDE 0.9 % IV SOLN
INTRAVENOUS | Status: AC
  Administered 2015-08-11: 19:00:00 via INTRAVENOUS

## 2015-08-11 MED ORDER — LEVALBUTEROL HCL 0.63 MG/3ML IN NEBU: 0.6300 mg | INHALATION_SOLUTION | Freq: Four times a day (QID) | RESPIRATORY_TRACT | Status: DC | PRN

## 2015-08-11 MED ORDER — KETOROLAC TROMETHAMINE 30 MG/ML IJ SOLN
30.0000 mg | Freq: Once | INTRAMUSCULAR | Status: AC
  Administered 2015-08-11: 30 mg via INTRAVENOUS
  Filled 2015-08-11: qty 1

## 2015-08-11 MED ORDER — SODIUM CHLORIDE 0.9 % IV BOLUS (SEPSIS)
250.0000 mL | Freq: Once | INTRAVENOUS | Status: AC
  Administered 2015-08-11: 250 mL via INTRAVENOUS

## 2015-08-11 MED ORDER — ENOXAPARIN SODIUM 40 MG/0.4ML ~~LOC~~ SOLN
40.0000 mg | SUBCUTANEOUS | Status: DC
  Administered 2015-08-11: 40 mg via SUBCUTANEOUS
  Filled 2015-08-11 (×2): qty 0.4

## 2015-08-11 MED ORDER — DOCUSATE SODIUM 100 MG PO CAPS: 100.0000 mg | ORAL_CAPSULE | Freq: Every day | ORAL | Status: DC

## 2015-08-11 MED ORDER — DOCUSATE SODIUM 100 MG PO CAPS
100.0000 mg | ORAL_CAPSULE | Freq: Two times a day (BID) | ORAL | Status: DC
Start: 2015-08-11 — End: 2015-08-12
  Administered 2015-08-11 – 2015-08-12 (×2): 100 mg via ORAL
  Filled 2015-08-11 (×2): qty 1

## 2015-08-11 MED ORDER — OXYCODONE HCL ER 10 MG PO T12A
10.0000 mg | EXTENDED_RELEASE_TABLET | Freq: Two times a day (BID) | ORAL | Status: DC
  Administered 2015-08-11: 10 mg via ORAL
  Filled 2015-08-11: qty 1

## 2015-08-11 MED ORDER — FENTANYL CITRATE (PF) 100 MCG/2ML IJ SOLN
25.0000 ug | INTRAMUSCULAR | Status: DC | PRN
Start: 2015-08-11 — End: 2015-08-12
  Administered 2015-08-11: 50 ug via INTRAVENOUS
  Filled 2015-08-11: qty 2

## 2015-08-11 NOTE — ED Notes (Signed)
Patient transported to CT 

## 2015-08-11 NOTE — ED Notes (Signed)
Pt complaint of worsening right ribcage pain post fall 1/24.

## 2015-08-11 NOTE — H&P (Addendum)
[facility] History and Physical  Jennifer Deleon [id number] DOB: 1945/09/04 DOA: 08/11/2015  Referring physician:  PCP: [name], NP   Chief Complaint:  Fall rib cage pain  HPI:  70 year old female with a history of hypothyroidism, asthma, depression, no other significant past medical history who fell in [place] while she was in her medication on 1/24, landed on her right side, sustained a wrist fracture and is status post ORIF of the wrist by Dr. Percell Miller on 08/08/15. Patient was under general Aanesthesia  for this outpatient surgery. After surgery the patient started noticing right chest wall pain, worse with deep inspiration, worse with any kind of movement and reproducible by palpation of the right chest wall. Patient did not experience any pain until after surgery. She denies any fever cough shortness of breath associated with her right chest wall pain. Rib x-ray shows multiple minimally displaced rib fractures, 6 through 9, no pneumothorax. Patient is extremely uncomfortable and unable to give any history because of the extent of her pain.      Review of Systems: negative for the following  Constitutional: Denies fever, chills, diaphoresis, appetite change and fatigue.  HEENT: Denies photophobia, eye pain, redness, hearing loss, ear pain, congestion, sore throat, rhinorrhea, sneezing, mouth sores, trouble swallowing, neck pain, neck stiffness and tinnitus.  Respiratory: Denies SOB, DOE, cough, chest tightness, and wheezing.  Cardiovascular: Positive for right-sided chest pain, palpitations and leg swelling.  Gastrointestinal: Denies nausea, vomiting, abdominal pain, diarrhea, constipation, blood in stool and abdominal distention.  Genitourinary: Denies dysuria, urgency, frequency, hematuria, flank pain and difficulty urinating.  Musculoskeletal: Denies myalgias, back pain, joint swelling, arthralgias and gait problem.  Skin: Denies pallor, rash and wound.  Neurological:  Denies dizziness, seizures, syncope, weakness, light-headedness, numbness and headaches.  Hematological: Denies adenopathy. Easy bruising, personal or family bleeding history  Psychiatric/Behavioral: Denies suicidal ideation, mood changes, confusion, nervousness, sleep disturbance and agitation       Past Medical History  Diagnosis Date  . Osteoporosis   . Environmental allergies     allergies all year long  . Chicken pox   . Hypothyroid   . Colon polyps   . UTI (lower urinary tract infection)   . Depression   . Asthma   . Arthritis   . Colon polyps      Past Surgical History  Procedure Laterality Date  . Tubal ligation    . Lumbar fusion  2015    l4-L5  . Laminectomy    . Cataract extraction Bilateral 2016      Social History:  reports that she has never smoked. She has never used smokeless tobacco. She reports that she drinks about 4.2 oz of alcohol per week. She reports that she does not use illicit drugs.    Allergies  Allergen Reactions  . Augmentin [Amoxicillin-Pot Clavulanate] Diarrhea and Other (See Comments)    Severe Stomach cramps  . Bactrim [Sulfamethoxazole-Trimethoprim] Swelling    Facial and lip swelling    Family History  Problem Relation Age of Onset  . Osteoarthritis Mother   . Osteoarthritis Maternal Grandmother   . Osteoarthritis Father   . Cancer Paternal Grandfather     Prostate Cancer mets to colon  . Hypertension Father   . Heart failure Father   . Arthritis    . Prostate cancer    . Mental illness          Prior to Admission medications   Medication Sig Start Date End Date Taking? Authorizing Provider  albuterol (PROVENTIL HFA;VENTOLIN HFA) 108 (90 BASE) MCG/ACT inhaler Inhale 2 puffs into the lungs every 6 (six) hours as needed for wheezing or shortness of breath.   Yes Historical Provider, MD  azelastine (ASTELIN) 0.1 % nasal spray Place 1 spray into both nostrils 2 (two) times daily. Use in each nostril as directed   Yes  Tiajuana Amass, MD  beclomethasone (QVAR) 40 MCG/ACT inhaler Inhale 2 puffs into the lungs 2 (two) times daily.   Yes Historical Provider, MD  buPROPion (WELLBUTRIN XL) 300 MG 24 hr tablet Take 300 mg by mouth every morning.   Yes Historical Provider, MD  calcium-vitamin D (OSCAL-500) 500-400 MG-UNIT per tablet Take 1 tablet by mouth 2 (two) times daily with a meal.   Yes Historical Provider, MD  cetirizine (ZYRTEC) 10 MG tablet Take 10 mg by mouth every morning.   Yes Historical Provider, MD  diphenhydrAMINE (BENADRYL) 25 MG tablet Take 25 mg by mouth every 6 (six) hours as needed for allergies.   Yes Historical Provider, MD  docusate sodium (COLACE) 100 MG capsule Take 100 mg by mouth at bedtime.   Yes Historical Provider, MD  fexofenadine (ALLER-EASE) 180 MG tablet Take 180 mg by mouth every morning.   Yes Historical Provider, MD  FLUoxetine (PROZAC) 40 MG capsule Take 40 mg by mouth every morning.   Yes Historical Provider, MD  gabapentin (NEURONTIN) 300 MG capsule Take 300 mg by mouth at bedtime.   Yes Historical Provider, MD  ibuprofen (ADVIL,MOTRIN) 200 MG tablet Take 200-800 mg by mouth every 6 (six) hours as needed for pain.   Yes Historical Provider, MD  levothyroxine (SYNTHROID, LEVOTHROID) 125 MCG tablet Take 125 mcg by mouth daily before breakfast.   Yes Historical Provider, MD  montelukast (SINGULAIR) 10 MG tablet Take 10 mg by mouth at bedtime.   Yes Historical Provider, MD  Multiple Vitamin (MULTIVITAMIN WITH MINERALS) TABS Take 1 tablet by mouth every morning.   Yes Historical Provider, MD  olopatadine (PATANOL) 0.1 % ophthalmic solution Place 1 drop into both eyes 2 (two) times daily.    Yes Historical Provider, MD  oxyCODONE-acetaminophen (PERCOCET/ROXICET) 5-325 MG tablet Take 1 tablet by mouth every 6 (six) hours as needed for moderate pain.  08/09/15  Yes Historical Provider, MD  triamcinolone cream (KENALOG) 0.1 % Apply 1 application topically 2 (two) times daily.  08/07/15  Yes  Historical Provider, MD  ciclopirox (CICLODAN) 0.77 % cream Apply topically 2 (two) times daily. Patient not taking: Reported on 08/11/2015 04/30/15   [name], NP     Physical Exam: Filed Vitals:   08/11/15 1352  BP: 103/74  Pulse: 67  Temp: 97.4 F (36.3 C)  TempSrc: Oral  Resp: 20  SpO2: 99%     Constitutional: Vital signs reviewed. Patient is a well-developed and well-nourished in no acute distress and cooperative with exam. Alert and oriented x3.  Head: Normocephalic and atraumatic  Ear: TM normal bilaterally  Mouth: no erythema or exudates, MMM  Eyes: PERRL, EOMI, conjunctivae normal, No scleral icterus.  Neck: Supple, Trachea midline normal ROM, No JVD, mass, thyromegaly, or carotid bruit present.  Cardiovascular: RRR, S1 normal, S2 normal, no MRG, pulses symmetric and intact bilaterally  Pulmonary/Chest: Effort normal and breath sounds normal. No stridor. No respiratory distress. She has no wheezes. She has no rales. She exhibits tenderness.  Tenderness to the right lateral chest wall, crepitus Abdominal: Soft. Non-tender, non-distended, bowel sounds are normal, no masses, organomegaly, or guarding present.  GU: no  CVA tenderness Musculoskeletal: No joint deformities, erythema, or stiffness, ROM full and no nontender Ext: no edema and no cyanosis, pulses palpable bilaterally (DP and PT)  Hematology: no cervical, inginal, or axillary adenopathy.  Neurological: A&O x3, Strenght is normal and symmetric bilaterally, cranial nerve II-XII are grossly intact, no focal motor deficit, sensory intact to light touch bilaterally.  Skin: Warm, dry and intact. No rash, cyanosis, or clubbing.  Psychiatric: Normal mood and affect. speech and behavior is normal. Judgment and thought content normal. Cognition and memory are normal.      Data Review   Micro Results No results found for this or any previous visit (from the past 240 hour(s)).  Radiology Reports Dg Ribs Unilateral  W/chest Right  08/11/2015  CLINICAL DATA:  RIGHT lateral lower rib pain. EXAM: RIGHT RIBS AND CHEST - 3+ VIEW COMPARISON:  05/03/2015 FINDINGS: Normal cardiac silhouette. Linear atelectasis at the LEFT lung base. Lungs are hyperinflated. Dedicated views of the RIGHT ribs demonstrate no displaced fracture. Dedicated views of the RIGHT ribs demonstrate multiple minimally displaced fractures including the ninth, eighth, seventh, and sixth ribs. No pneumothorax. IMPRESSION: 1. Multiple minimally displaced rib fractures including sixth through ninth ribs. 2. No pneumothorax. 3. Hyperinflated lungs. Electronically Signed   By: Suzy Bouchard M.D.   On: 08/11/2015 16:04     CBC  Recent Labs Lab 08/11/15 1531  WBC 9.6  HGB 13.2  HCT 40.2  PLT 403*  MCV 95.5  MCH 31.4  MCHC 32.8  RDW 13.2  LYMPHSABS 1.5  MONOABS 1.4*  EOSABS 0.1  BASOSABS 0.0    Chemistries   Recent Labs Lab 08/11/15 1531  NA 134*  K 3.5  CL 100*  CO2 20*  GLUCOSE 117*  BUN 13  CREATININE 0.66  CALCIUM 9.3   ------------------------------------------------------------------------------------------------------------------ CrCl cannot be calculated (Unknown ideal weight.). ------------------------------------------------------------------------------------------------------------------ No results for input(s): HGBA1C in the last 72 hours. ------------------------------------------------------------------------------------------------------------------ No results for input(s): CHOL, HDL, LDLCALC, TRIG, CHOLHDL, LDLDIRECT in the last 72 hours. ------------------------------------------------------------------------------------------------------------------ No results for input(s): TSH, T4TOTAL, T3FREE, THYROIDAB in the last 72 hours.  Invalid input(s): FREET3 ------------------------------------------------------------------------------------------------------------------ No results for input(s): VITAMINB12,  FOLATE, FERRITIN, TIBC, IRON, RETICCTPCT in the last 72 hours.  Coagulation profile No results for input(s): INR, PROTIME in the last 168 hours.  No results for input(s): DDIMER in the last 72 hours.  Cardiac Enzymes No results for input(s): CKMB, TROPONINI, MYOGLOBIN in the last 168 hours.  Invalid input(s): CK ------------------------------------------------------------------------------------------------------------------ Invalid input(s): POCBNP   CBG: No results for input(s): GLUCAP in the last 168 hours.     EKG: Independently reviewed.    Assessment/Plan Principal Problem:   Multiple rib fractures-in the setting of recent surgery on 2/1 with onset of pain after surgery and recent history of travel, will obtain CT chest PE protocol to rule out PE Start patient on OxyContin, oxycodone, Toradol for pain control Patient has been taking Percocet and Motrin for pain and denies any wrist pain at this point  Right wrist fracture, status post ORIF by Dr. Percell Miller on 2/1    Hypothyroidism-continue Synthroid  Depression continue Wellbutrin, Prozac, gabapentin  Asthma continue with Singulair, will also start patient on when necessary Xopenex as well as incentive spirometry.     Code Status Orders        Start     Ordered   08/11/15 1709  Full code   Continuous     08/11/15 1710    Code Status History    Date Active  Date Inactive Code Status Order ID Comments User Context   This patient has a current code status but no historical code status.      Family Communication: bedside Disposition Plan: admit   Total time spent 55 minutes.Greater than 50% of this time was spent in counseling, explanation of diagnosis, planning of further management, and coordination of care  [facility] Pager [phone]  If 7PM-7AM, please contact night-coverage [email or web address] Password [email or web address] 08/11/2015, 5:11 PM

## 2015-08-11 NOTE — ED Notes (Signed)
Went to take patient to floor and CT requested she have her procedure beforehand

## 2015-08-11 NOTE — ED Provider Notes (Signed)
CSN: [id number]     Arrival date & time 08/11/15  1346 History   First MD Initiated Contact with Patient 08/11/15 1421     Chief Complaint  Patient presents with  . Fall  . Ribcage Pain     HPI   70 year old female presents today with right rib cage pain. Patient reports that one week ago she was in [place] when she slipped on some stones falling and landing on her right wrist and chest. She reports at that time she had deformity of the wrist, had a wrist fracture diagnosed on x-ray with subsequent surgical management 2 days ago by Dr. Percell Miller. Patient reports that she continues to have right rib pain that has been worsening, pain with deep inspiration, extreme tenderness to palpation of the chest wall. She reports some shortness of breath due to pain. She states overt he last two days pain have dramatically increased.    Past Medical History  Diagnosis Date  . Osteoporosis   . Environmental allergies     allergies all year long  . Chicken pox   . Hypothyroid   . Colon polyps   . UTI (lower urinary tract infection)   . Depression   . Asthma   . Arthritis   . Colon polyps    Past Surgical History  Procedure Laterality Date  . Tubal ligation    . Lumbar fusion  2015    l4-L5  . Laminectomy    . Cataract extraction Bilateral 2016   Family History  Problem Relation Age of Onset  . Osteoarthritis Mother   . Osteoarthritis Maternal Grandmother   . Osteoarthritis Father   . Cancer Paternal Grandfather     Prostate Cancer mets to colon  . Hypertension Father   . Heart failure Father   . Arthritis    . Prostate cancer    . Mental illness     Social History  Substance Use Topics  . Smoking status: Never Smoker   . Smokeless tobacco: Never Used  . Alcohol Use: 4.2 oz/week    7 Glasses of wine per week     Comment: a glass of wine with dinner each night   OB History    No data available     Review of Systems  All other systems reviewed and are  negative.   Allergies  Augmentin and Bactrim  Home Medications   Prior to Admission medications   Medication Sig Start Date End Date Taking? Authorizing Provider  albuterol (PROVENTIL HFA;VENTOLIN HFA) 108 (90 BASE) MCG/ACT inhaler Inhale 2 puffs into the lungs every 6 (six) hours as needed for wheezing or shortness of breath.   Yes Historical Provider, MD  azelastine (ASTELIN) 0.1 % nasal spray Place 1 spray into both nostrils 2 (two) times daily. Use in each nostril as directed   Yes Tiajuana Amass, MD  beclomethasone (QVAR) 40 MCG/ACT inhaler Inhale 2 puffs into the lungs 2 (two) times daily.   Yes Historical Provider, MD  buPROPion (WELLBUTRIN XL) 300 MG 24 hr tablet Take 300 mg by mouth every morning.   Yes Historical Provider, MD  calcium-vitamin D (OSCAL-500) 500-400 MG-UNIT per tablet Take 1 tablet by mouth 2 (two) times daily with a meal.   Yes Historical Provider, MD  cetirizine (ZYRTEC) 10 MG tablet Take 10 mg by mouth every morning.   Yes Historical Provider, MD  diphenhydrAMINE (BENADRYL) 25 MG tablet Take 25 mg by mouth every 6 (six) hours as needed for allergies.  Yes Historical Provider, MD  docusate sodium (COLACE) 100 MG capsule Take 100 mg by mouth at bedtime.   Yes Historical Provider, MD  fexofenadine (ALLER-EASE) 180 MG tablet Take 180 mg by mouth every morning.   Yes Historical Provider, MD  FLUoxetine (PROZAC) 40 MG capsule Take 40 mg by mouth every morning.   Yes Historical Provider, MD  gabapentin (NEURONTIN) 300 MG capsule Take 300 mg by mouth at bedtime.   Yes Historical Provider, MD  ibuprofen (ADVIL,MOTRIN) 200 MG tablet Take 200-800 mg by mouth every 6 (six) hours as needed for pain.   Yes Historical Provider, MD  levothyroxine (SYNTHROID, LEVOTHROID) 125 MCG tablet Take 125 mcg by mouth daily before breakfast.   Yes Historical Provider, MD  montelukast (SINGULAIR) 10 MG tablet Take 10 mg by mouth at bedtime.   Yes Historical Provider, MD  Multiple Vitamin  (MULTIVITAMIN WITH MINERALS) TABS Take 1 tablet by mouth every morning.   Yes Historical Provider, MD  olopatadine (PATANOL) 0.1 % ophthalmic solution Place 1 drop into both eyes 2 (two) times daily.    Yes Historical Provider, MD  oxyCODONE-acetaminophen (PERCOCET/ROXICET) 5-325 MG tablet Take 1 tablet by mouth every 6 (six) hours as needed for moderate pain.  08/09/15  Yes Historical Provider, MD  triamcinolone cream (KENALOG) 0.1 % Apply 1 application topically 2 (two) times daily.  08/07/15  Yes Historical Provider, MD  ciclopirox (CICLODAN) 0.77 % cream Apply topically 2 (two) times daily. Patient not taking: Reported on 08/11/2015 04/30/15   [name], NP   BP 103/74 mmHg  Pulse 67  Temp(Src) 97.4 F (36.3 C) (Oral)  Resp 20  SpO2 99%    Physical Exam  Constitutional: She is oriented to person, place, and time. She appears well-developed and well-nourished.  HENT:  Head: Normocephalic and atraumatic.  Eyes: Conjunctivae are normal. Pupils are equal, round, and reactive to light. Right eye exhibits no discharge. Left eye exhibits no discharge. No scleral icterus.  Neck: Normal range of motion. No JVD present. No tracheal deviation present.  Cardiovascular: Normal rate, regular rhythm, normal heart sounds and intact distal pulses.  Exam reveals no gallop and no friction rub.   No murmur heard. Pulmonary/Chest: Effort normal and breath sounds normal. No stridor. No respiratory distress. She has no wheezes. She has no rales. She exhibits tenderness.  Tenderness to the right lateral chest wall, crepitus   Abdominal: Soft. She exhibits no distension and no mass. There is no tenderness. There is no rebound and no guarding.  Musculoskeletal: She exhibits no edema or tenderness.  Neurological: She is alert and oriented to person, place, and time. Coordination normal.  Skin: Skin is warm and dry. No rash noted. No erythema. No pallor.  Psychiatric: She has a normal mood and affect. Her  behavior is normal. Judgment and thought content normal.  Nursing note and vitals reviewed.     ED Course  Procedures (including critical care time) Labs Review Labs Reviewed  CBC WITH DIFFERENTIAL/PLATELET - Abnormal; Notable for the following:    Platelets 403 (*)    Monocytes Absolute 1.4 (*)    All other components within normal limits  BASIC METABOLIC PANEL - Abnormal; Notable for the following:    Sodium 134 (*)    Chloride 100 (*)    CO2 20 (*)    Glucose, Bld 117 (*)    All other components within normal limits    Imaging Review Dg Ribs Unilateral W/chest Right  08/11/2015  CLINICAL DATA:  RIGHT lateral lower rib pain. EXAM: RIGHT RIBS AND CHEST - 3+ VIEW COMPARISON:  05/03/2015 FINDINGS: Normal cardiac silhouette. Linear atelectasis at the LEFT lung base. Lungs are hyperinflated. Dedicated views of the RIGHT ribs demonstrate no displaced fracture. Dedicated views of the RIGHT ribs demonstrate multiple minimally displaced fractures including the ninth, eighth, seventh, and sixth ribs. No pneumothorax. IMPRESSION: 1. Multiple minimally displaced rib fractures including sixth through ninth ribs. 2. No pneumothorax. 3. Hyperinflated lungs. Electronically Signed   By: Suzy Bouchard M.D.   On: 08/11/2015 16:04   I have personally reviewed and evaluated these images and lab results as part of my medical decision-making.   EKG Interpretation None      MDM   Final diagnoses:  Rib fracture, right, closed, initial encounter    Labs: CBC, BMP  Imaging: Rib unilateral with chest- minimally displaced fractures including the sixth through ninth ribs  Consults:   Therapeutics: Morphine  Discharge Meds:   Assessment/Plan: 70 year old female presents today with rib fractures. Patient has been managing this at home with Percocet and hydrocodone, patient also has a fracture of her right wrist. She reports pain has significantly worsened over the last several days, excruciating  tender to palpation and movements, unable to manage pain at home. Patient has managed to swell up until this point, she has no signs of infectious etiology, but due to inability to manage pain hospice consulted for observation and pain management.          Okey Regal, PA-C 08/11/15 1647  Tanna Furry, MD 08/15/15 [id number]

## 2015-08-11 NOTE — Progress Notes (Signed)
Received pt from ED, alert and oriented, VS obtained, telemetry box applied, oriented to unit, call light placed with in reach.

## 2015-08-11 NOTE — ED Notes (Addendum)
PATIENT CAN GO TO FLOOR AT 1730

## 2015-08-12 DIAGNOSIS — E032 Hypothyroidism due to medicaments and other exogenous substances: Secondary | ICD-10-CM

## 2015-08-12 LAB — CBC
HEMATOCRIT: 37.5 % (ref 36.0–46.0)
Hemoglobin: 12.1 g/dL (ref 12.0–15.0)
MCH: 31.2 pg (ref 26.0–34.0)
MCHC: 32.3 g/dL (ref 30.0–36.0)
MCV: 96.6 fL (ref 78.0–100.0)
Platelets: 342 10*3/uL (ref 150–400)
RBC: 3.88 MIL/uL (ref 3.87–5.11)
RDW: 13.5 % (ref 11.5–15.5)
WBC: 6.9 10*3/uL (ref 4.0–10.5)

## 2015-08-12 LAB — COMPREHENSIVE METABOLIC PANEL
ALBUMIN: 3.1 g/dL — AB (ref 3.5–5.0)
ALT: 23 U/L (ref 14–54)
AST: 22 U/L (ref 15–41)
Alkaline Phosphatase: 67 U/L (ref 38–126)
Anion gap: 7 (ref 5–15)
BUN: 17 mg/dL (ref 6–20)
CO2: 26 mmol/L (ref 22–32)
CREATININE: 0.75 mg/dL (ref 0.44–1.00)
Calcium: 8.8 mg/dL — ABNORMAL LOW (ref 8.9–10.3)
Chloride: 105 mmol/L (ref 101–111)
GFR calc Af Amer: >60 mL/min (ref >60)
Glucose, Bld: 118 mg/dL — ABNORMAL HIGH (ref 65–99)
POTASSIUM: 3.6 mmol/L (ref 3.5–5.1)
Sodium: 138 mmol/L (ref 135–145)
Total Bilirubin: 0.4 mg/dL (ref 0.3–1.2)
Total Protein: 5.9 g/dL — ABNORMAL LOW (ref 6.5–8.1)

## 2015-08-12 LAB — TROPONIN I

## 2015-08-12 MED ORDER — OXYCODONE HCL 5 MG PO TABS: 5.0000 mg | ORAL_TABLET | ORAL | Status: DC | PRN

## 2015-08-12 NOTE — Discharge Summary (Signed)
Physician Discharge Summary  DESTENY FREEMAN MRN: [id number] DOB/AGE: [birth year] 72 y.o.  PCP: [name], NP   Admit date: 08/11/2015 Discharge date: 08/12/2015  Discharge Diagnoses:     Principal Problem:   Multiple rib fractures Active Problems:   Hypothyroidism   Rib fractures    Follow-up recommendations Follow-up with PCP in 3-5 days , including all  additional recommended appointments as below Follow-up CBC, CMP in 3-5 days Patient advised to continue with incentive spirometry at home     Medication List    STOP taking these medications        oxyCODONE-acetaminophen 5-325 MG tablet  Commonly known as:  PERCOCET/ROXICET      TAKE these medications        albuterol 108 (90 Base) MCG/ACT inhaler  Commonly known as:  PROVENTIL HFA;VENTOLIN HFA  Inhale 2 puffs into the lungs every 6 (six) hours as needed for wheezing or shortness of breath.     ALLER-EASE 180 MG tablet  Generic drug:  fexofenadine  Take 180 mg by mouth every morning.     azelastine 0.1 % nasal spray  Commonly known as:  ASTELIN  Place 1 spray into both nostrils 2 (two) times daily. Use in each nostril as directed     beclomethasone 40 MCG/ACT inhaler  Commonly known as:  QVAR  Inhale 2 puffs into the lungs 2 (two) times daily.     buPROPion 300 MG 24 hr tablet  Commonly known as:  WELLBUTRIN XL  Take 300 mg by mouth every morning.     calcium-vitamin D 500-400 MG-UNIT tablet  Commonly known as:  OSCAL-500  Take 1 tablet by mouth 2 (two) times daily with a meal.     cetirizine 10 MG tablet  Commonly known as:  ZYRTEC  Take 10 mg by mouth every morning.     ciclopirox 0.77 % cream  Commonly known as:  CICLODAN  Apply topically 2 (two) times daily.     diphenhydrAMINE 25 MG tablet  Commonly known as:  BENADRYL  Take 25 mg by mouth every 6 (six) hours as needed for allergies.     docusate sodium 100 MG capsule  Commonly known as:  COLACE  Take 100 mg by mouth at bedtime.     FLUoxetine 40 MG capsule  Commonly known as:  PROZAC  Take 40 mg by mouth every morning.     gabapentin 300 MG capsule  Commonly known as:  NEURONTIN  Take 300 mg by mouth at bedtime.     ibuprofen 200 MG tablet  Commonly known as:  ADVIL,MOTRIN  Take 200-800 mg by mouth every 6 (six) hours as needed for pain.     levothyroxine 125 MCG tablet  Commonly known as:  SYNTHROID, LEVOTHROID  Take 125 mcg by mouth daily before breakfast.     montelukast 10 MG tablet  Commonly known as:  SINGULAIR  Take 10 mg by mouth at bedtime.     multivitamin with minerals Tabs tablet  Take 1 tablet by mouth every morning.     olopatadine 0.1 % ophthalmic solution  Commonly known as:  PATANOL  Place 1 drop into both eyes 2 (two) times daily.     oxyCODONE 5 MG immediate release tablet  Commonly known as:  Oxy IR/ROXICODONE  Take 1 tablet (5 mg total) by mouth every 4 (four) hours as needed for moderate pain.     triamcinolone cream 0.1 %  Commonly known as:  KENALOG  Apply 1  application topically 2 (two) times daily.         Discharge Condition: Stable   Discharge Instructions       Discharge Instructions    Diet - low sodium heart healthy    Complete by:  As directed      Increase activity slowly    Complete by:  As directed            Allergies  Allergen Reactions  . Augmentin [Amoxicillin-Pot Clavulanate] Diarrhea and Other (See Comments)    Severe Stomach cramps  . Bactrim [Sulfamethoxazole-Trimethoprim] Swelling    Facial and lip swelling      Disposition: 01-Home or Self Care   Consults:      Significant Diagnostic Studies:  Dg Ribs Unilateral W/chest Right  08/11/2015  CLINICAL DATA:  RIGHT lateral lower rib pain. EXAM: RIGHT RIBS AND CHEST - 3+ VIEW COMPARISON:  05/03/2015 FINDINGS: Normal cardiac silhouette. Linear atelectasis at the LEFT lung base. Lungs are hyperinflated. Dedicated views of the RIGHT ribs demonstrate no displaced fracture. Dedicated  views of the RIGHT ribs demonstrate multiple minimally displaced fractures including the ninth, eighth, seventh, and sixth ribs. No pneumothorax. IMPRESSION: 1. Multiple minimally displaced rib fractures including sixth through ninth ribs. 2. No pneumothorax. 3. Hyperinflated lungs. Electronically Signed   By: Suzy Bouchard M.D.   On: 08/11/2015 16:04   Ct Angio Chest Pe W/cm &/or Wo Cm  08/11/2015  CLINICAL DATA:  Fall, chest pain EXAM: CT ANGIOGRAPHY CHEST WITH CONTRAST TECHNIQUE: Multidetector CT imaging of the chest was performed using the standard protocol during bolus administration of intravenous contrast. Multiplanar CT image reconstructions and MIPs were obtained to evaluate the vascular anatomy. CONTRAST:  169m OMNIPAQUE IOHEXOL 350 MG/ML SOLN COMPARISON:  Chest radiograph 08/11/2015 FINDINGS: Mediastinum/Nodes: No axillary supraclavicular adenopathy. No mediastinal adenopathy. No mediastinal hematoma. Aortic arch is normal. No filling defects within the pulmonary arteries to suggest acute pulmonary embolism. Lungs/Pleura: There is a moderate RIGHT effusion with associated mild passive atelectasis. Fusions low-attenuation. No pneumothorax. Mild basilar atelectasis the LEFT. Upper abdomen: Limited view of the liver, kidneys, pancreas are unremarkable. Normal adrenal glands. Musculoskeletal: The posterior RIGHT rib fractures are better seen on comparison radiograph. There anterior lateral involving the fifth sixth (image 60, series 14 ribs. No LEFT rib fractures evident. Review of the MIP images confirms the above findings. IMPRESSION: 1. No acute pulmonary embolism. 2. Anterior medial RIGHT rib fractures. Rib fractures are better defined on comparison radiograph. 3. Small RIGHT effusion.  No pneumothorax. Electronically Signed   By: SSuzy BouchardM.D.   On: 08/11/2015 18:28        Filed Weights   08/11/15 1821  Weight: 52.2 kg (115 lb 1.3 oz)      Labs: Results for orders placed or  performed during the hospital encounter of 08/11/15 (from the past 48 hour(s))  Troponin I     Status: None   Collection Time: 08/11/15  3:30 PM  Result Value Ref Range   Troponin I <0.03 <0.031 ng/mL    Comment:        NO INDICATION OF MYOCARDIAL INJURY.   CBC with Differential     Status: Abnormal   Collection Time: 08/11/15  3:31 PM  Result Value Ref Range   WBC 9.6 4.0 - 10.5 K/uL   RBC 4.21 3.87 - 5.11 MIL/uL   Hemoglobin 13.2 12.0 - 15.0 g/dL   HCT 40.2 36.0 - 46.0 %   MCV 95.5 78.0 - 100.0 fL  MCH 31.4 26.0 - 34.0 pg   MCHC 32.8 30.0 - 36.0 g/dL   RDW 13.2 11.5 - 15.5 %   Platelets 403 (H) 150 - 400 K/uL   Neutrophils Relative % 70 %   Neutro Abs 6.7 1.7 - 7.7 K/uL   Lymphocytes Relative 15 %   Lymphs Abs 1.5 0.7 - 4.0 K/uL   Monocytes Relative 14 %   Monocytes Absolute 1.4 (H) 0.1 - 1.0 K/uL   Eosinophils Relative 1 %   Eosinophils Absolute 0.1 0.0 - 0.7 K/uL   Basophils Relative 0 %   Basophils Absolute 0.0 0.0 - 0.1 K/uL  Basic metabolic panel     Status: Abnormal   Collection Time: 08/11/15  3:31 PM  Result Value Ref Range   Sodium 134 (L) 135 - 145 mmol/L   Potassium 3.5 3.5 - 5.1 mmol/L   Chloride 100 (L) 101 - 111 mmol/L   CO2 20 (L) 22 - 32 mmol/L   Glucose, Bld 117 (H) 65 - 99 mg/dL   BUN 13 6 - 20 mg/dL   Creatinine, Ser 0.66 0.44 - 1.00 mg/dL   Calcium 9.3 8.9 - 10.3 mg/dL   GFR calc non Af Amer >60 >60 mL/min   GFR calc Af Amer >60 >60 mL/min    Comment: (NOTE) The eGFR has been calculated using the CKD EPI equation. This calculation has not been validated in all clinical situations. eGFR's persistently <60 mL/min signify possible Chronic Kidney Disease.    Anion gap 14 5 - 15  Troponin I     Status: None   Collection Time: 08/11/15 11:03 PM  Result Value Ref Range   Troponin I <0.03 <0.031 ng/mL    Comment:        NO INDICATION OF MYOCARDIAL INJURY.   Comprehensive metabolic panel     Status: Abnormal   Collection Time: 08/12/15  5:32  AM  Result Value Ref Range   Sodium 138 135 - 145 mmol/L   Potassium 3.6 3.5 - 5.1 mmol/L   Chloride 105 101 - 111 mmol/L   CO2 26 22 - 32 mmol/L   Glucose, Bld 118 (H) 65 - 99 mg/dL   BUN 17 6 - 20 mg/dL   Creatinine, Ser 0.75 0.44 - 1.00 mg/dL   Calcium 8.8 (L) 8.9 - 10.3 mg/dL   Total Protein 5.9 (L) 6.5 - 8.1 g/dL   Albumin 3.1 (L) 3.5 - 5.0 g/dL   AST 22 15 - 41 U/L   ALT 23 14 - 54 U/L   Alkaline Phosphatase 67 38 - 126 U/L   Total Bilirubin 0.4 0.3 - 1.2 mg/dL   GFR calc non Af Amer >60 >60 mL/min   GFR calc Af Amer >60 >60 mL/min    Comment: (NOTE) The eGFR has been calculated using the CKD EPI equation. This calculation has not been validated in all clinical situations. eGFR's persistently <60 mL/min signify possible Chronic Kidney Disease.    Anion gap 7 5 - 15  CBC     Status: None   Collection Time: 08/12/15  5:32 AM  Result Value Ref Range   WBC 6.9 4.0 - 10.5 K/uL   RBC 3.88 3.87 - 5.11 MIL/uL   Hemoglobin 12.1 12.0 - 15.0 g/dL   HCT 37.5 36.0 - 46.0 %   MCV 96.6 78.0 - 100.0 fL   MCH 31.2 26.0 - 34.0 pg   MCHC 32.3 30.0 - 36.0 g/dL   RDW 13.5 11.5 - 15.5 %  Platelets 342 150 - 400 K/uL     Lipid Panel  No results found for: CHOL, TRIG, HDL, CHOLHDL, VLDL, LDLCALC, LDLDIRECT   No results found for: HGBA1C   Lab Results  Component Value Date   CREATININE 0.75 08/12/2015     HPI :70 year old female with a history of hypothyroidism, asthma, depression, no other significant past medical history who fell in [place] while she was in her medication on 1/24, landed on her right side, sustained a wrist fracture and is status post ORIF of the wrist by Dr. Percell Miller on 08/08/15. Patient was under general Aanesthesia for this outpatient surgery. After surgery the patient started noticing right chest wall pain, worse with deep inspiration, worse with any kind of movement and reproducible by palpation of the right chest wall. Patient did not experience any pain  until after surgery. She denies any fever cough shortness of breath associated with her right chest wall pain. Rib x-ray shows multiple minimally displaced rib fractures, 6 through 9, no pneumothorax. Patient is extremely uncomfortable and unable to give any history because of the extent of her pain.   HOSPITAL COURSE:   Multiple right-sided rib fractures-in the setting of recent surgery on 2/1 with onset of chest  pain after surgery and recent history of travel,   CT chest PE protocol was done, no PE was seen, patient was found to have anterior medial right rib fractures, small right pleural effusion, no pneumothorax Patient received OxyContin, oxycodone, Toradol for pain control during this hospitalization Patient has been taking Percocet and Motrin for pain   This has been switched to oxycodone and ibuprofen Continue incentive spirometry  Right wrist fracture, status post ORIF by Dr. Percell Miller on 2/1, follow-up with Dr. Percell Miller as needed   Hypothyroidism-continue Synthroid  Depression continue Wellbutrin, Prozac, gabapentin  Asthma continue with Singulair,  continue home nebulizers   Discharge Exam:    Blood pressure 124/64, pulse 62, temperature 98.1 F (36.7 C), temperature source Oral, resp. rate 18, height '5\' 3"'$  (1.6 m), weight 52.2 kg (115 lb 1.3 oz), SpO2 97 %.  Cardiovascular: RRR, S1 normal, S2 normal, no MRG, pulses symmetric and intact bilaterally  Pulmonary/Chest: Effort normal and breath sounds normal. No stridor. No respiratory distress. She has no wheezes. She has no rales. She exhibits tenderness.  Tenderness to the right lateral chest wall, crepitus Abdominal: Soft. Non-tender, non-distended, bowel sounds are normal, no masses, organomegaly, or guarding present.  GU: no CVA tenderness Musculoskeletal: No joint deformities, erythema, or stiffness, ROM full and no nontender    Follow-up Information    Follow up with [name], NP. Schedule an appointment as soon  as possible for a visit in 3 days.   Specialty:  Family Medicine   Contact information:   [address] [place] [id number] [phone]       Signed: Reyne Dumas 08/12/2015, 9:40 AM        Time spent >45 mins

## 2015-08-12 NOTE — Evaluation (Signed)
Physical Therapy Evaluation Patient Details Name: Jennifer Deleon MRN: [id number] DOB: 04/25/1946 Today's Date: 08/12/2015   History of Present Illness  70 y.o. female who fell while on vacation in [place], she sustained a R wrist fx, s/p ORIF 08/08/15. Pt admitted with multiple rib fxs, R 6-9.   Clinical Impression  Pt ambulated 400' independently without loss of balance. Instructed pt in R hand, elbow, shoulder ROM exercises. She may need outpt PT or OT once her wrist splint is removed.     Follow Up Recommendations No PT follow up    Equipment Recommendations  None recommended by PT    Recommendations for Other Services OT consult     Precautions / Restrictions Precautions Precautions: None Required Braces or Orthoses: Other Brace/Splint Other Brace/Splint: R wrist Restrictions Weight Bearing Restrictions: No      Mobility  Bed Mobility               General bed mobility comments: NT-up in chair  Transfers Overall transfer level: Needs assistance Equipment used: None Transfers: Sit to/from Stand Sit to Stand: Min assist         General transfer comment: min A to rise  Ambulation/Gait Ambulation/Gait assistance: Independent Ambulation Distance (Feet): 400 Feet Assistive device: None Gait Pattern/deviations: WFL(Within Functional Limits)   Gait velocity interpretation: at or above normal speed for age/gender General Gait Details: steady, no LOB  Stairs            Wheelchair Mobility    Modified Rankin (Stroke Patients Only)       Balance Overall balance assessment: Independent                                           Pertinent Vitals/Pain Pain Assessment: 0-10 Pain Score: 4  Pain Location: R ribs Pain Descriptors / Indicators: Sharp Pain Intervention(s): Premedicated before session;Monitored during session;Repositioned;Limited activity within patient's tolerance (instructed pt in bracing ribs wtih pillow)    Home  Living Family/patient expects to be discharged to:: Private residence Living Arrangements: Spouse/significant other Available Help at Discharge: Family;Available 24 hours/day                  Prior Function Level of Independence: Independent               Hand Dominance        Extremity/Trunk Assessment   Upper Extremity Assessment: RUE deficits/detail RUE Deficits / Details: unable to elevate shoulder due to pain, elbow flexion AROM WNL, extension -20*, pain with attempted pronation/supination; fingers are swollen, finger AROM decreased 40%, encouraged finger, elbow, shoulder AROM and shoulder rolls         Lower Extremity Assessment: Overall WFL for tasks assessed      Cervical / Trunk Assessment: Normal  Communication   Communication: No difficulties  Cognition Arousal/Alertness: Awake/alert Behavior During Therapy: WFL for tasks assessed/performed Overall Cognitive Status: Within Functional Limits for tasks assessed                      General Comments      Exercises Shoulder Exercises Elbow Flexion: AROM;Right;5 reps;Seated Digit Composite Flexion: AROM;Right;5 reps;Seated Composite Extension: AROM;Right;5 reps Other Exercises Other Exercises: R shoulder roll AROM x 5 seated      Assessment/Plan    PT Assessment Patent does not need any further PT services  PT Diagnosis  PT Problem List    PT Treatment Interventions     PT Goals (Current goals can be found in the Care Plan section) Acute Rehab PT Goals Patient Stated Goal: decrease pain PT Goal Formulation: All assessment and education complete, DC therapy    Frequency     Barriers to discharge        Co-evaluation               End of Session Equipment Utilized During Treatment: Gait belt Activity Tolerance: Patient tolerated treatment well Patient left: in chair;with call bell/phone within reach;with family/visitor present Nurse Communication: Mobility status          Time: [id number] PT Time Calculation (min) (ACUTE ONLY): 15 min   Charges:   PT Evaluation $PT Eval Low Complexity: 1 Procedure     PT G CodesPhilomena Deleon 08/12/2015, 12:04 PM  [phone]

## 2015-08-12 NOTE — Progress Notes (Signed)
Patient given discharge, medication and follow up instructions, verbalized understanding, prescription given, IV and telemetry box removed, family to transport home.

## 2015-08-14 ENCOUNTER — Telehealth: Payer: Self-pay

## 2015-08-14 NOTE — Telephone Encounter (Signed)
First TCM attempt

## 2015-08-15 NOTE — Telephone Encounter (Signed)
Spoke with patient and she "is doing well and doesn't see the point of following up".

## 2015-08-17 DIAGNOSIS — J301 Allergic rhinitis due to pollen: Secondary | ICD-10-CM | POA: Diagnosis not present

## 2015-08-17 DIAGNOSIS — J3081 Allergic rhinitis due to animal (cat) (dog) hair and dander: Secondary | ICD-10-CM | POA: Diagnosis not present

## 2015-08-17 DIAGNOSIS — J3089 Other allergic rhinitis: Secondary | ICD-10-CM | POA: Diagnosis not present

## 2015-08-20 DIAGNOSIS — S52501D Unspecified fracture of the lower end of right radius, subsequent encounter for closed fracture with routine healing: Secondary | ICD-10-CM | POA: Diagnosis not present

## 2015-08-24 DIAGNOSIS — J3081 Allergic rhinitis due to animal (cat) (dog) hair and dander: Secondary | ICD-10-CM | POA: Diagnosis not present

## 2015-08-24 DIAGNOSIS — J3089 Other allergic rhinitis: Secondary | ICD-10-CM | POA: Diagnosis not present

## 2015-08-24 DIAGNOSIS — J301 Allergic rhinitis due to pollen: Secondary | ICD-10-CM | POA: Diagnosis not present

## 2015-08-28 DIAGNOSIS — S52501D Unspecified fracture of the lower end of right radius, subsequent encounter for closed fracture with routine healing: Secondary | ICD-10-CM | POA: Diagnosis not present

## 2015-08-31 DIAGNOSIS — J3089 Other allergic rhinitis: Secondary | ICD-10-CM | POA: Diagnosis not present

## 2015-08-31 DIAGNOSIS — J3081 Allergic rhinitis due to animal (cat) (dog) hair and dander: Secondary | ICD-10-CM | POA: Diagnosis not present

## 2015-08-31 DIAGNOSIS — J301 Allergic rhinitis due to pollen: Secondary | ICD-10-CM | POA: Diagnosis not present

## 2015-09-06 DIAGNOSIS — J3081 Allergic rhinitis due to animal (cat) (dog) hair and dander: Secondary | ICD-10-CM | POA: Diagnosis not present

## 2015-09-06 DIAGNOSIS — J301 Allergic rhinitis due to pollen: Secondary | ICD-10-CM | POA: Diagnosis not present

## 2015-09-06 DIAGNOSIS — J3089 Other allergic rhinitis: Secondary | ICD-10-CM | POA: Diagnosis not present

## 2015-09-07 DIAGNOSIS — J301 Allergic rhinitis due to pollen: Secondary | ICD-10-CM | POA: Diagnosis not present

## 2015-09-07 DIAGNOSIS — J3081 Allergic rhinitis due to animal (cat) (dog) hair and dander: Secondary | ICD-10-CM | POA: Diagnosis not present

## 2015-09-07 DIAGNOSIS — J3089 Other allergic rhinitis: Secondary | ICD-10-CM | POA: Diagnosis not present

## 2015-09-14 DIAGNOSIS — J3089 Other allergic rhinitis: Secondary | ICD-10-CM | POA: Diagnosis not present

## 2015-09-14 DIAGNOSIS — J3081 Allergic rhinitis due to animal (cat) (dog) hair and dander: Secondary | ICD-10-CM | POA: Diagnosis not present

## 2015-09-14 DIAGNOSIS — J301 Allergic rhinitis due to pollen: Secondary | ICD-10-CM | POA: Diagnosis not present

## 2015-09-19 DIAGNOSIS — S52501D Unspecified fracture of the lower end of right radius, subsequent encounter for closed fracture with routine healing: Secondary | ICD-10-CM | POA: Diagnosis not present

## 2015-09-20 DIAGNOSIS — J3081 Allergic rhinitis due to animal (cat) (dog) hair and dander: Secondary | ICD-10-CM | POA: Diagnosis not present

## 2015-09-20 DIAGNOSIS — J3089 Other allergic rhinitis: Secondary | ICD-10-CM | POA: Diagnosis not present

## 2015-09-20 DIAGNOSIS — J301 Allergic rhinitis due to pollen: Secondary | ICD-10-CM | POA: Diagnosis not present

## 2015-09-21 ENCOUNTER — Other Ambulatory Visit: Payer: Self-pay | Admitting: Orthopedic Surgery

## 2015-09-21 DIAGNOSIS — M4316 Spondylolisthesis, lumbar region: Secondary | ICD-10-CM

## 2015-09-21 DIAGNOSIS — M5440 Lumbago with sciatica, unspecified side: Secondary | ICD-10-CM | POA: Diagnosis not present

## 2015-09-21 DIAGNOSIS — M4806 Spinal stenosis, lumbar region: Secondary | ICD-10-CM | POA: Diagnosis not present

## 2015-09-25 DIAGNOSIS — S52531D Colles' fracture of right radius, subsequent encounter for closed fracture with routine healing: Secondary | ICD-10-CM | POA: Diagnosis not present

## 2015-09-26 DIAGNOSIS — J3089 Other allergic rhinitis: Secondary | ICD-10-CM | POA: Diagnosis not present

## 2015-09-26 DIAGNOSIS — J301 Allergic rhinitis due to pollen: Secondary | ICD-10-CM | POA: Diagnosis not present

## 2015-09-26 DIAGNOSIS — J3081 Allergic rhinitis due to animal (cat) (dog) hair and dander: Secondary | ICD-10-CM | POA: Diagnosis not present

## 2015-09-27 DIAGNOSIS — S52531D Colles' fracture of right radius, subsequent encounter for closed fracture with routine healing: Secondary | ICD-10-CM | POA: Diagnosis not present

## 2015-09-30 ENCOUNTER — Ambulatory Visit
Admission: RE | Admit: 2015-09-30 | Discharge: 2015-09-30 | Disposition: A | Discharge status: Home / Self Care | Payer: Medicare Other | Source: Ambulatory Visit | Attending: Orthopedic Surgery | Admitting: Orthopedic Surgery

## 2015-09-30 DIAGNOSIS — M4806 Spinal stenosis, lumbar region: Secondary | ICD-10-CM | POA: Diagnosis not present

## 2015-09-30 DIAGNOSIS — M4316 Spondylolisthesis, lumbar region: Secondary | ICD-10-CM

## 2015-10-01 DIAGNOSIS — J301 Allergic rhinitis due to pollen: Secondary | ICD-10-CM | POA: Diagnosis not present

## 2015-10-01 DIAGNOSIS — J3089 Other allergic rhinitis: Secondary | ICD-10-CM | POA: Diagnosis not present

## 2015-10-01 DIAGNOSIS — J3081 Allergic rhinitis due to animal (cat) (dog) hair and dander: Secondary | ICD-10-CM | POA: Diagnosis not present

## 2015-10-02 DIAGNOSIS — S52531D Colles' fracture of right radius, subsequent encounter for closed fracture with routine healing: Secondary | ICD-10-CM | POA: Diagnosis not present

## 2015-10-03 DIAGNOSIS — J301 Allergic rhinitis due to pollen: Secondary | ICD-10-CM | POA: Diagnosis not present

## 2015-10-03 DIAGNOSIS — J3089 Other allergic rhinitis: Secondary | ICD-10-CM | POA: Diagnosis not present

## 2015-10-03 DIAGNOSIS — J3081 Allergic rhinitis due to animal (cat) (dog) hair and dander: Secondary | ICD-10-CM | POA: Diagnosis not present

## 2015-10-04 DIAGNOSIS — S52531D Colles' fracture of right radius, subsequent encounter for closed fracture with routine healing: Secondary | ICD-10-CM | POA: Diagnosis not present

## 2015-10-08 DIAGNOSIS — S52531D Colles' fracture of right radius, subsequent encounter for closed fracture with routine healing: Secondary | ICD-10-CM | POA: Diagnosis not present

## 2015-10-12 DIAGNOSIS — J3081 Allergic rhinitis due to animal (cat) (dog) hair and dander: Secondary | ICD-10-CM | POA: Diagnosis not present

## 2015-10-12 DIAGNOSIS — S52531D Colles' fracture of right radius, subsequent encounter for closed fracture with routine healing: Secondary | ICD-10-CM | POA: Diagnosis not present

## 2015-10-12 DIAGNOSIS — J3089 Other allergic rhinitis: Secondary | ICD-10-CM | POA: Diagnosis not present

## 2015-10-12 DIAGNOSIS — J301 Allergic rhinitis due to pollen: Secondary | ICD-10-CM | POA: Diagnosis not present

## 2015-10-15 DIAGNOSIS — S52531D Colles' fracture of right radius, subsequent encounter for closed fracture with routine healing: Secondary | ICD-10-CM | POA: Diagnosis not present

## 2015-10-17 DIAGNOSIS — S52531D Colles' fracture of right radius, subsequent encounter for closed fracture with routine healing: Secondary | ICD-10-CM | POA: Diagnosis not present

## 2015-10-18 DIAGNOSIS — J3081 Allergic rhinitis due to animal (cat) (dog) hair and dander: Secondary | ICD-10-CM | POA: Diagnosis not present

## 2015-10-18 DIAGNOSIS — J301 Allergic rhinitis due to pollen: Secondary | ICD-10-CM | POA: Diagnosis not present

## 2015-10-18 DIAGNOSIS — J3089 Other allergic rhinitis: Secondary | ICD-10-CM | POA: Diagnosis not present

## 2015-10-22 DIAGNOSIS — S52531D Colles' fracture of right radius, subsequent encounter for closed fracture with routine healing: Secondary | ICD-10-CM | POA: Diagnosis not present

## 2015-10-24 DIAGNOSIS — S52501D Unspecified fracture of the lower end of right radius, subsequent encounter for closed fracture with routine healing: Secondary | ICD-10-CM | POA: Diagnosis not present

## 2015-10-26 DIAGNOSIS — J3081 Allergic rhinitis due to animal (cat) (dog) hair and dander: Secondary | ICD-10-CM | POA: Diagnosis not present

## 2015-10-26 DIAGNOSIS — J3089 Other allergic rhinitis: Secondary | ICD-10-CM | POA: Diagnosis not present

## 2015-10-26 DIAGNOSIS — S52531D Colles' fracture of right radius, subsequent encounter for closed fracture with routine healing: Secondary | ICD-10-CM | POA: Diagnosis not present

## 2015-10-26 DIAGNOSIS — M4806 Spinal stenosis, lumbar region: Secondary | ICD-10-CM | POA: Diagnosis not present

## 2015-10-26 DIAGNOSIS — M4316 Spondylolisthesis, lumbar region: Secondary | ICD-10-CM | POA: Diagnosis not present

## 2015-10-26 DIAGNOSIS — J301 Allergic rhinitis due to pollen: Secondary | ICD-10-CM | POA: Diagnosis not present

## 2015-10-26 DIAGNOSIS — M5116 Intervertebral disc disorders with radiculopathy, lumbar region: Secondary | ICD-10-CM | POA: Diagnosis not present

## 2015-10-26 DIAGNOSIS — M5440 Lumbago with sciatica, unspecified side: Secondary | ICD-10-CM | POA: Diagnosis not present

## 2015-10-31 DIAGNOSIS — S52531D Colles' fracture of right radius, subsequent encounter for closed fracture with routine healing: Secondary | ICD-10-CM | POA: Diagnosis not present

## 2015-11-01 DIAGNOSIS — M5116 Intervertebral disc disorders with radiculopathy, lumbar region: Secondary | ICD-10-CM | POA: Diagnosis not present

## 2015-11-01 DIAGNOSIS — J3089 Other allergic rhinitis: Secondary | ICD-10-CM | POA: Diagnosis not present

## 2015-11-01 DIAGNOSIS — Z681 Body mass index (BMI) 19 or less, adult: Secondary | ICD-10-CM | POA: Diagnosis not present

## 2015-11-01 DIAGNOSIS — J3081 Allergic rhinitis due to animal (cat) (dog) hair and dander: Secondary | ICD-10-CM | POA: Diagnosis not present

## 2015-11-01 DIAGNOSIS — J301 Allergic rhinitis due to pollen: Secondary | ICD-10-CM | POA: Diagnosis not present

## 2015-11-01 DIAGNOSIS — M5416 Radiculopathy, lumbar region: Secondary | ICD-10-CM | POA: Diagnosis not present

## 2015-11-06 DIAGNOSIS — S52531D Colles' fracture of right radius, subsequent encounter for closed fracture with routine healing: Secondary | ICD-10-CM | POA: Diagnosis not present

## 2015-11-09 DIAGNOSIS — J3081 Allergic rhinitis due to animal (cat) (dog) hair and dander: Secondary | ICD-10-CM | POA: Diagnosis not present

## 2015-11-09 DIAGNOSIS — J3089 Other allergic rhinitis: Secondary | ICD-10-CM | POA: Diagnosis not present

## 2015-11-09 DIAGNOSIS — J301 Allergic rhinitis due to pollen: Secondary | ICD-10-CM | POA: Diagnosis not present

## 2015-11-09 DIAGNOSIS — S52531D Colles' fracture of right radius, subsequent encounter for closed fracture with routine healing: Secondary | ICD-10-CM | POA: Diagnosis not present

## 2015-11-13 DIAGNOSIS — M4806 Spinal stenosis, lumbar region: Secondary | ICD-10-CM | POA: Diagnosis not present

## 2015-11-14 DIAGNOSIS — S52531D Colles' fracture of right radius, subsequent encounter for closed fracture with routine healing: Secondary | ICD-10-CM | POA: Diagnosis not present

## 2015-11-16 DIAGNOSIS — J301 Allergic rhinitis due to pollen: Secondary | ICD-10-CM | POA: Diagnosis not present

## 2015-11-16 DIAGNOSIS — S52531D Colles' fracture of right radius, subsequent encounter for closed fracture with routine healing: Secondary | ICD-10-CM | POA: Diagnosis not present

## 2015-11-16 DIAGNOSIS — J3089 Other allergic rhinitis: Secondary | ICD-10-CM | POA: Diagnosis not present

## 2015-11-16 DIAGNOSIS — J3081 Allergic rhinitis due to animal (cat) (dog) hair and dander: Secondary | ICD-10-CM | POA: Diagnosis not present

## 2015-11-21 DIAGNOSIS — S52501D Unspecified fracture of the lower end of right radius, subsequent encounter for closed fracture with routine healing: Secondary | ICD-10-CM | POA: Diagnosis not present

## 2015-11-22 DIAGNOSIS — S52531D Colles' fracture of right radius, subsequent encounter for closed fracture with routine healing: Secondary | ICD-10-CM | POA: Diagnosis not present

## 2015-11-23 DIAGNOSIS — J301 Allergic rhinitis due to pollen: Secondary | ICD-10-CM | POA: Diagnosis not present

## 2015-11-23 DIAGNOSIS — J3081 Allergic rhinitis due to animal (cat) (dog) hair and dander: Secondary | ICD-10-CM | POA: Diagnosis not present

## 2015-11-23 DIAGNOSIS — J3089 Other allergic rhinitis: Secondary | ICD-10-CM | POA: Diagnosis not present

## 2015-11-30 DIAGNOSIS — J301 Allergic rhinitis due to pollen: Secondary | ICD-10-CM | POA: Diagnosis not present

## 2015-11-30 DIAGNOSIS — J3081 Allergic rhinitis due to animal (cat) (dog) hair and dander: Secondary | ICD-10-CM | POA: Diagnosis not present

## 2015-11-30 DIAGNOSIS — J3089 Other allergic rhinitis: Secondary | ICD-10-CM | POA: Diagnosis not present

## 2015-12-04 DIAGNOSIS — M4316 Spondylolisthesis, lumbar region: Secondary | ICD-10-CM | POA: Diagnosis not present

## 2015-12-04 DIAGNOSIS — M5416 Radiculopathy, lumbar region: Secondary | ICD-10-CM | POA: Diagnosis not present

## 2015-12-04 DIAGNOSIS — M4806 Spinal stenosis, lumbar region: Secondary | ICD-10-CM | POA: Diagnosis not present

## 2015-12-07 DIAGNOSIS — J3081 Allergic rhinitis due to animal (cat) (dog) hair and dander: Secondary | ICD-10-CM | POA: Diagnosis not present

## 2015-12-07 DIAGNOSIS — J301 Allergic rhinitis due to pollen: Secondary | ICD-10-CM | POA: Diagnosis not present

## 2015-12-07 DIAGNOSIS — J3089 Other allergic rhinitis: Secondary | ICD-10-CM | POA: Diagnosis not present

## 2015-12-14 DIAGNOSIS — S52531D Colles' fracture of right radius, subsequent encounter for closed fracture with routine healing: Secondary | ICD-10-CM | POA: Diagnosis not present

## 2015-12-14 DIAGNOSIS — J3081 Allergic rhinitis due to animal (cat) (dog) hair and dander: Secondary | ICD-10-CM | POA: Diagnosis not present

## 2015-12-14 DIAGNOSIS — J3089 Other allergic rhinitis: Secondary | ICD-10-CM | POA: Diagnosis not present

## 2015-12-14 DIAGNOSIS — J301 Allergic rhinitis due to pollen: Secondary | ICD-10-CM | POA: Diagnosis not present

## 2015-12-20 DIAGNOSIS — J301 Allergic rhinitis due to pollen: Secondary | ICD-10-CM | POA: Diagnosis not present

## 2015-12-20 DIAGNOSIS — J3089 Other allergic rhinitis: Secondary | ICD-10-CM | POA: Diagnosis not present

## 2015-12-20 DIAGNOSIS — J3081 Allergic rhinitis due to animal (cat) (dog) hair and dander: Secondary | ICD-10-CM | POA: Diagnosis not present

## 2015-12-20 DIAGNOSIS — J4530 Mild persistent asthma, uncomplicated: Secondary | ICD-10-CM | POA: Diagnosis not present

## 2015-12-21 DIAGNOSIS — J3081 Allergic rhinitis due to animal (cat) (dog) hair and dander: Secondary | ICD-10-CM | POA: Diagnosis not present

## 2015-12-21 DIAGNOSIS — J301 Allergic rhinitis due to pollen: Secondary | ICD-10-CM | POA: Diagnosis not present

## 2015-12-21 DIAGNOSIS — J3089 Other allergic rhinitis: Secondary | ICD-10-CM | POA: Diagnosis not present

## 2015-12-24 DIAGNOSIS — M810 Age-related osteoporosis without current pathological fracture: Secondary | ICD-10-CM | POA: Diagnosis not present

## 2015-12-24 DIAGNOSIS — J45909 Unspecified asthma, uncomplicated: Secondary | ICD-10-CM | POA: Diagnosis not present

## 2015-12-24 DIAGNOSIS — J309 Allergic rhinitis, unspecified: Secondary | ICD-10-CM | POA: Diagnosis not present

## 2015-12-24 DIAGNOSIS — F329 Major depressive disorder, single episode, unspecified: Secondary | ICD-10-CM | POA: Diagnosis not present

## 2015-12-24 DIAGNOSIS — E039 Hypothyroidism, unspecified: Secondary | ICD-10-CM | POA: Diagnosis not present

## 2015-12-28 DIAGNOSIS — J301 Allergic rhinitis due to pollen: Secondary | ICD-10-CM | POA: Diagnosis not present

## 2015-12-28 DIAGNOSIS — J3089 Other allergic rhinitis: Secondary | ICD-10-CM | POA: Diagnosis not present

## 2015-12-28 DIAGNOSIS — J3081 Allergic rhinitis due to animal (cat) (dog) hair and dander: Secondary | ICD-10-CM | POA: Diagnosis not present

## 2015-12-31 DIAGNOSIS — J301 Allergic rhinitis due to pollen: Secondary | ICD-10-CM | POA: Diagnosis not present

## 2015-12-31 DIAGNOSIS — J3089 Other allergic rhinitis: Secondary | ICD-10-CM | POA: Diagnosis not present

## 2015-12-31 DIAGNOSIS — J3081 Allergic rhinitis due to animal (cat) (dog) hair and dander: Secondary | ICD-10-CM | POA: Diagnosis not present

## 2016-01-03 DIAGNOSIS — J3089 Other allergic rhinitis: Secondary | ICD-10-CM | POA: Diagnosis not present

## 2016-01-03 DIAGNOSIS — R05 Cough: Secondary | ICD-10-CM | POA: Diagnosis not present

## 2016-01-03 DIAGNOSIS — J301 Allergic rhinitis due to pollen: Secondary | ICD-10-CM | POA: Diagnosis not present

## 2016-01-03 DIAGNOSIS — J3081 Allergic rhinitis due to animal (cat) (dog) hair and dander: Secondary | ICD-10-CM | POA: Diagnosis not present

## 2016-01-03 DIAGNOSIS — J4530 Mild persistent asthma, uncomplicated: Secondary | ICD-10-CM | POA: Diagnosis not present

## 2016-01-04 DIAGNOSIS — J3081 Allergic rhinitis due to animal (cat) (dog) hair and dander: Secondary | ICD-10-CM | POA: Diagnosis not present

## 2016-01-04 DIAGNOSIS — J301 Allergic rhinitis due to pollen: Secondary | ICD-10-CM | POA: Diagnosis not present

## 2016-01-04 DIAGNOSIS — J3089 Other allergic rhinitis: Secondary | ICD-10-CM | POA: Diagnosis not present

## 2016-01-11 DIAGNOSIS — J301 Allergic rhinitis due to pollen: Secondary | ICD-10-CM | POA: Diagnosis not present

## 2016-01-11 DIAGNOSIS — J3089 Other allergic rhinitis: Secondary | ICD-10-CM | POA: Diagnosis not present

## 2016-01-11 DIAGNOSIS — J3081 Allergic rhinitis due to animal (cat) (dog) hair and dander: Secondary | ICD-10-CM | POA: Diagnosis not present

## 2016-01-13 IMAGING — MG MM SCREEN MAMMOGRAM BILATERAL
4 series · 4 of 4 positions shown · non-contrast
Comparison: Previous exam(s).

CLINICAL DATA: Screening.

EXAM:
DIGITAL SCREENING BILATERAL MAMMOGRAM WITH CAD

[R CC]
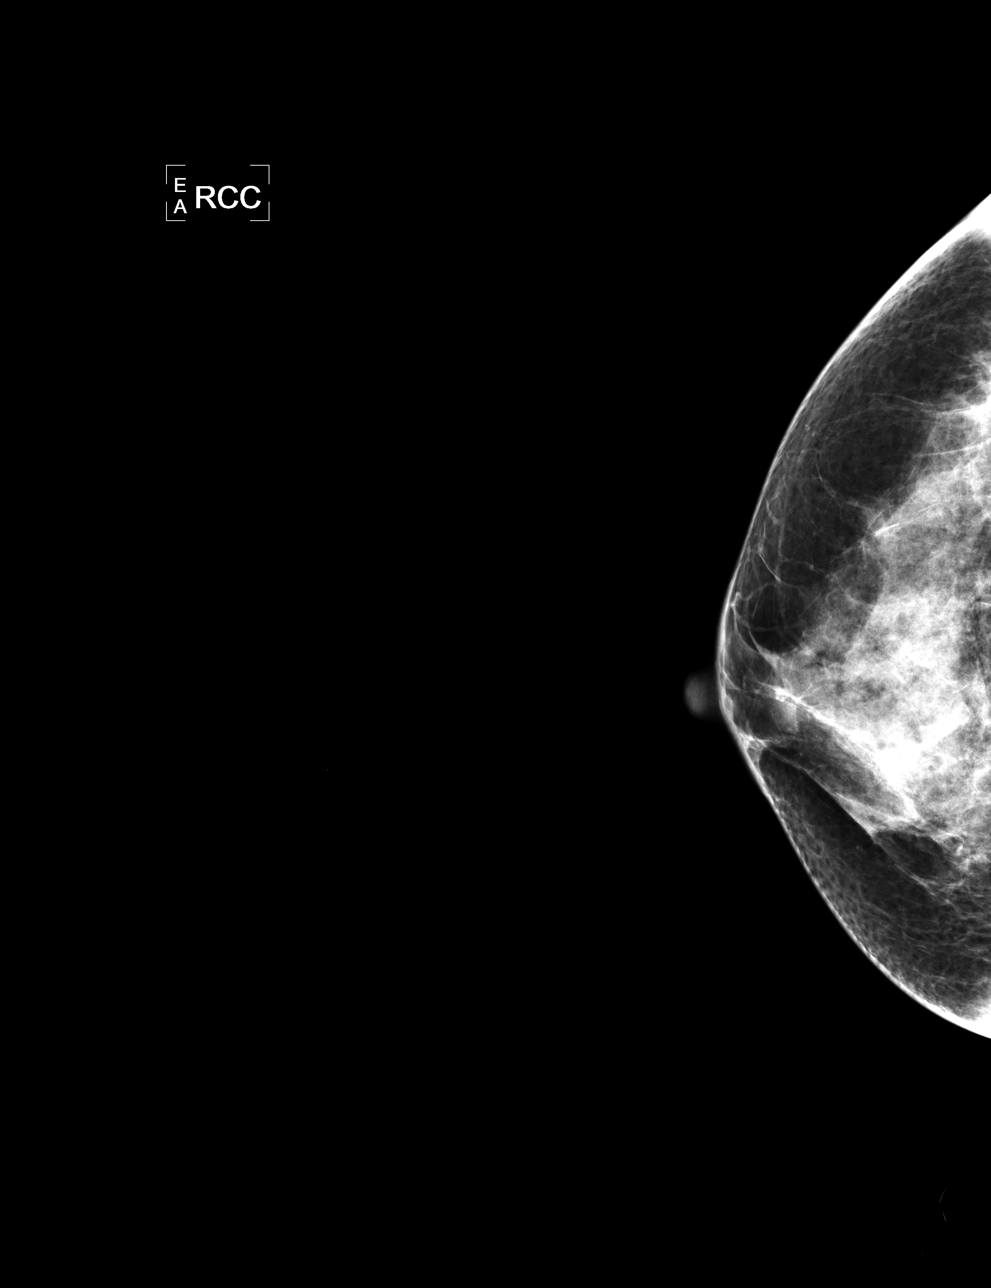

[L CC]
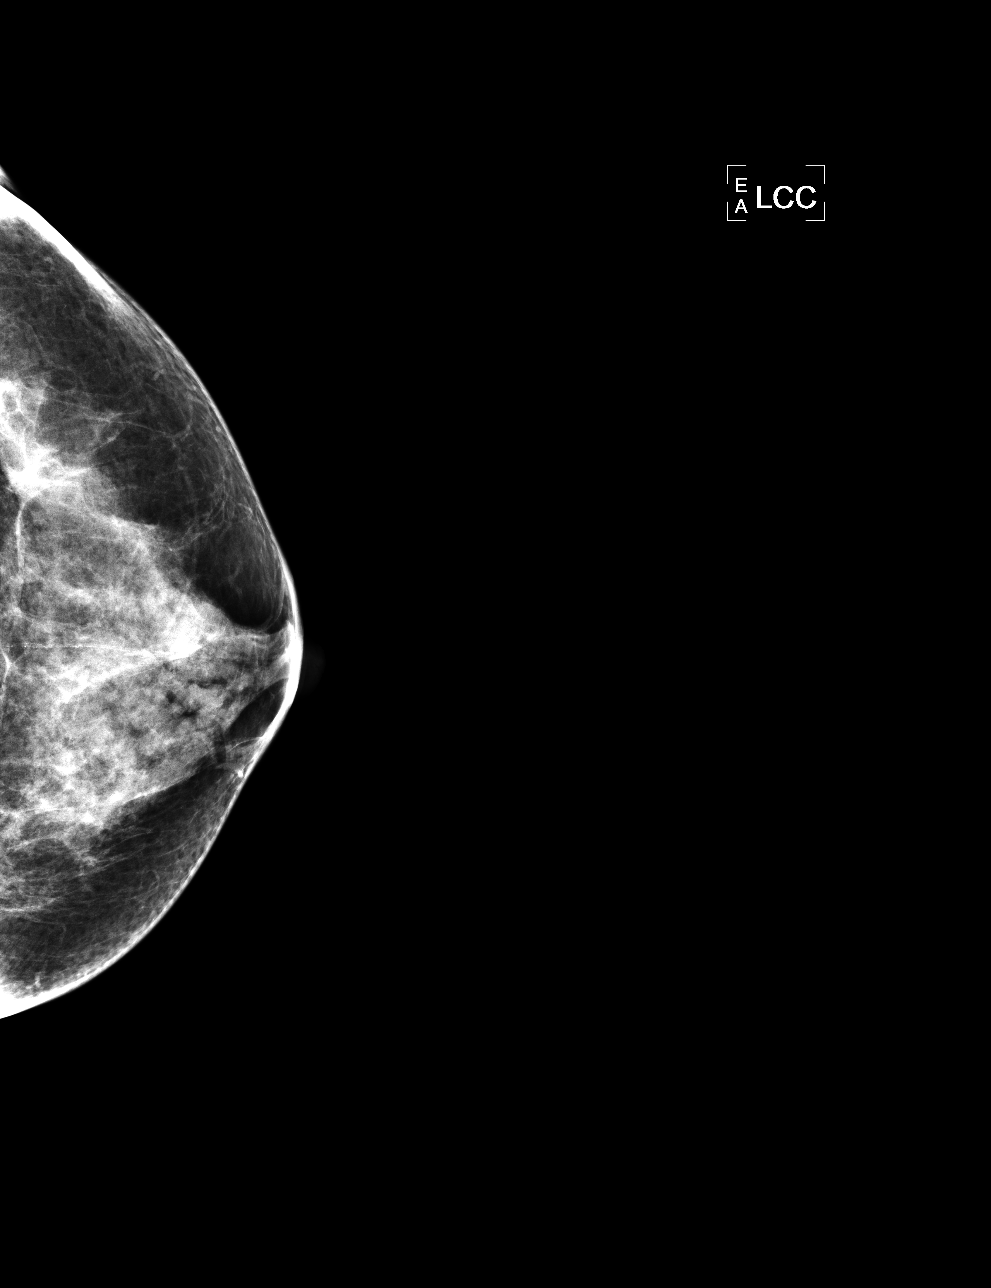

[L MLO]
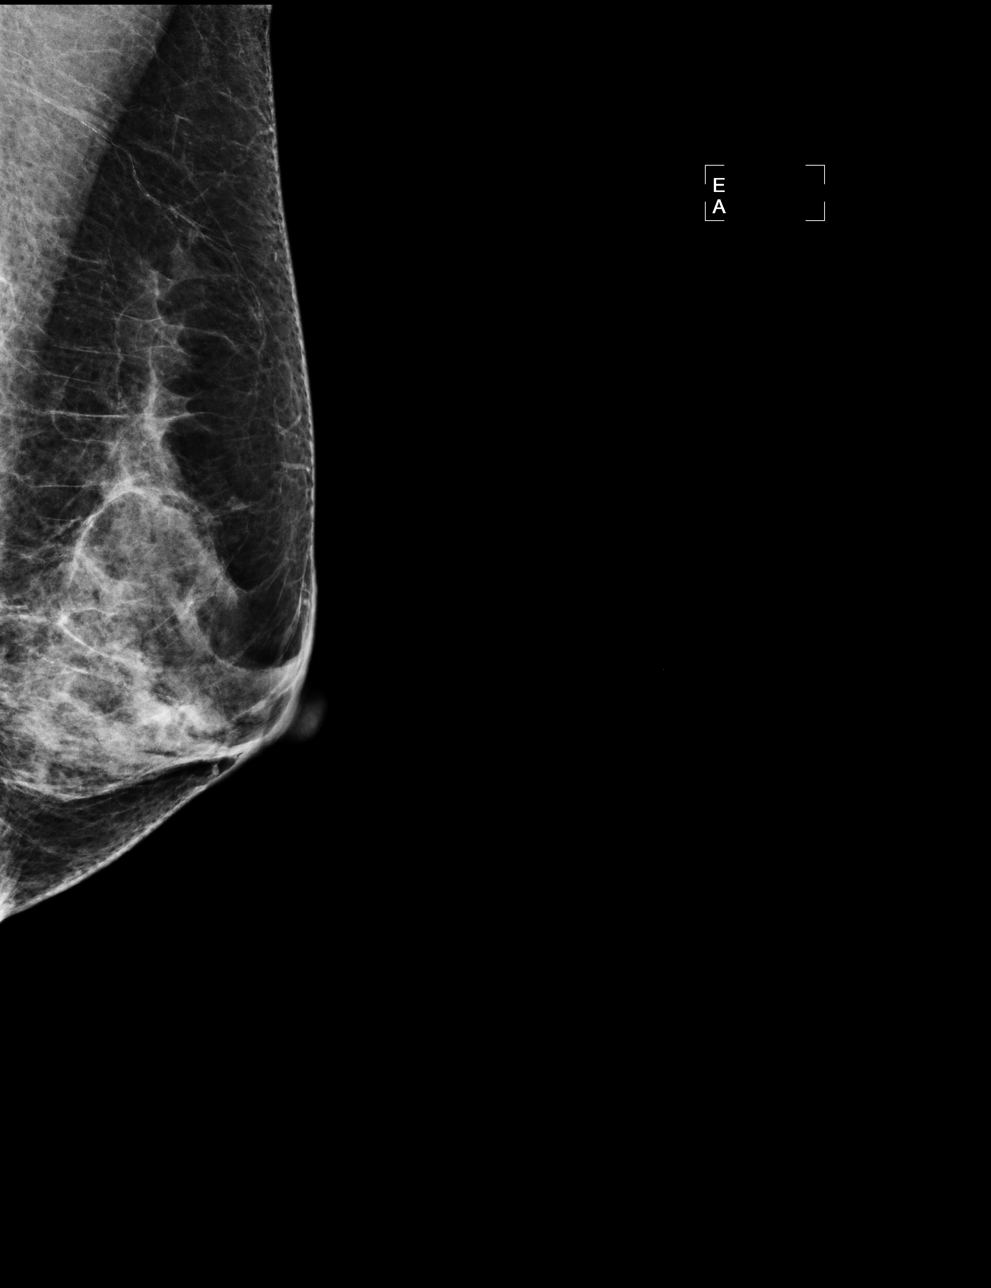

[R MLO]
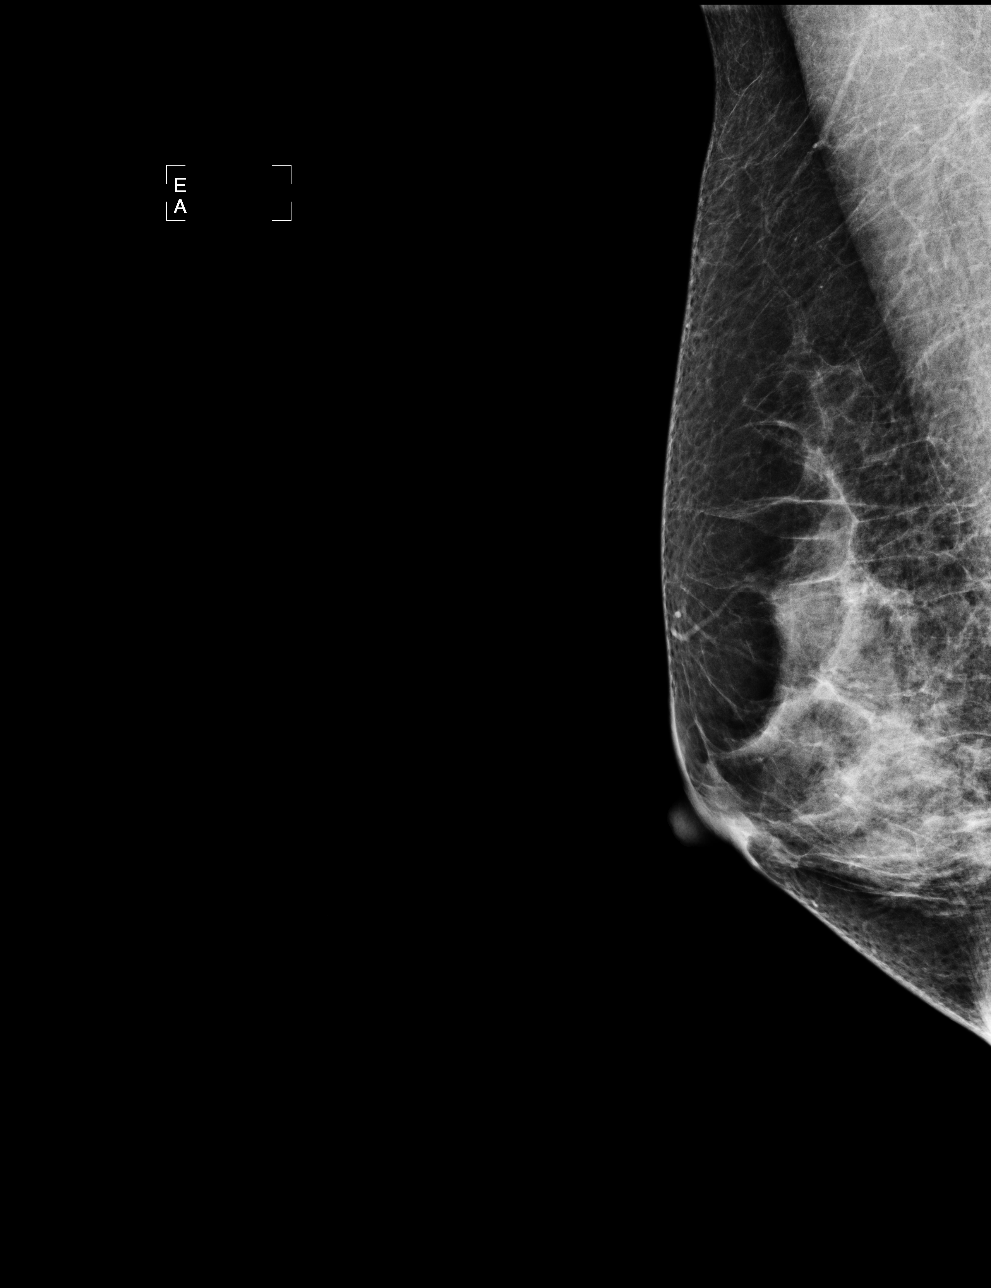

[4 of 4 positions shown; findings below may reference images not displayed]

ACR Breast Density Category c: The breast tissue is heterogeneously
dense, which may obscure small masses.
FINDINGS: There are no findings suspicious for malignancy. Images were
processed with CAD.
IMPRESSION: No mammographic evidence of malignancy. A result letter of this
screening mammogram will be mailed directly to the patient.

RECOMMENDATION:
Screening mammogram in one year. (Code:[id number])

BI-RADS CATEGORY  1: Negative.

## 2016-01-15 ENCOUNTER — Other Ambulatory Visit: Payer: Self-pay | Admitting: Endocrinology

## 2016-01-15 DIAGNOSIS — M810 Age-related osteoporosis without current pathological fracture: Secondary | ICD-10-CM

## 2016-01-18 DIAGNOSIS — J301 Allergic rhinitis due to pollen: Secondary | ICD-10-CM | POA: Diagnosis not present

## 2016-01-18 DIAGNOSIS — J3081 Allergic rhinitis due to animal (cat) (dog) hair and dander: Secondary | ICD-10-CM | POA: Diagnosis not present

## 2016-01-18 DIAGNOSIS — J3089 Other allergic rhinitis: Secondary | ICD-10-CM | POA: Diagnosis not present

## 2016-01-25 DIAGNOSIS — Z961 Presence of intraocular lens: Secondary | ICD-10-CM | POA: Diagnosis not present

## 2016-01-25 DIAGNOSIS — Z0100 Encounter for examination of eyes and vision without abnormal findings: Secondary | ICD-10-CM | POA: Diagnosis not present

## 2016-01-25 DIAGNOSIS — J301 Allergic rhinitis due to pollen: Secondary | ICD-10-CM | POA: Diagnosis not present

## 2016-01-25 DIAGNOSIS — J3081 Allergic rhinitis due to animal (cat) (dog) hair and dander: Secondary | ICD-10-CM | POA: Diagnosis not present

## 2016-01-25 DIAGNOSIS — J3089 Other allergic rhinitis: Secondary | ICD-10-CM | POA: Diagnosis not present

## 2016-01-25 DIAGNOSIS — H26491 Other secondary cataract, right eye: Secondary | ICD-10-CM | POA: Diagnosis not present

## 2016-02-05 DIAGNOSIS — J3081 Allergic rhinitis due to animal (cat) (dog) hair and dander: Secondary | ICD-10-CM | POA: Diagnosis not present

## 2016-02-05 DIAGNOSIS — J301 Allergic rhinitis due to pollen: Secondary | ICD-10-CM | POA: Diagnosis not present

## 2016-02-05 DIAGNOSIS — J3089 Other allergic rhinitis: Secondary | ICD-10-CM | POA: Diagnosis not present

## 2016-02-08 DIAGNOSIS — M4316 Spondylolisthesis, lumbar region: Secondary | ICD-10-CM | POA: Diagnosis not present

## 2016-02-08 DIAGNOSIS — M4806 Spinal stenosis, lumbar region: Secondary | ICD-10-CM | POA: Diagnosis not present

## 2016-02-08 DIAGNOSIS — M5416 Radiculopathy, lumbar region: Secondary | ICD-10-CM | POA: Diagnosis not present

## 2016-02-12 DIAGNOSIS — J301 Allergic rhinitis due to pollen: Secondary | ICD-10-CM | POA: Diagnosis not present

## 2016-02-12 DIAGNOSIS — J3081 Allergic rhinitis due to animal (cat) (dog) hair and dander: Secondary | ICD-10-CM | POA: Diagnosis not present

## 2016-02-12 DIAGNOSIS — J3089 Other allergic rhinitis: Secondary | ICD-10-CM | POA: Diagnosis not present

## 2016-02-18 DIAGNOSIS — M5416 Radiculopathy, lumbar region: Secondary | ICD-10-CM | POA: Diagnosis not present

## 2016-02-21 DIAGNOSIS — H26491 Other secondary cataract, right eye: Secondary | ICD-10-CM | POA: Diagnosis not present

## 2016-02-26 DIAGNOSIS — J301 Allergic rhinitis due to pollen: Secondary | ICD-10-CM | POA: Diagnosis not present

## 2016-02-26 DIAGNOSIS — J3089 Other allergic rhinitis: Secondary | ICD-10-CM | POA: Diagnosis not present

## 2016-02-26 DIAGNOSIS — J3081 Allergic rhinitis due to animal (cat) (dog) hair and dander: Secondary | ICD-10-CM | POA: Diagnosis not present

## 2016-02-28 DIAGNOSIS — J301 Allergic rhinitis due to pollen: Secondary | ICD-10-CM | POA: Diagnosis not present

## 2016-02-28 DIAGNOSIS — J3081 Allergic rhinitis due to animal (cat) (dog) hair and dander: Secondary | ICD-10-CM | POA: Diagnosis not present

## 2016-02-28 DIAGNOSIS — J3089 Other allergic rhinitis: Secondary | ICD-10-CM | POA: Diagnosis not present

## 2016-03-06 DIAGNOSIS — J301 Allergic rhinitis due to pollen: Secondary | ICD-10-CM | POA: Diagnosis not present

## 2016-03-06 DIAGNOSIS — J3081 Allergic rhinitis due to animal (cat) (dog) hair and dander: Secondary | ICD-10-CM | POA: Diagnosis not present

## 2016-03-06 DIAGNOSIS — J3089 Other allergic rhinitis: Secondary | ICD-10-CM | POA: Diagnosis not present

## 2016-03-11 DIAGNOSIS — M4316 Spondylolisthesis, lumbar region: Secondary | ICD-10-CM | POA: Diagnosis not present

## 2016-03-11 DIAGNOSIS — M5116 Intervertebral disc disorders with radiculopathy, lumbar region: Secondary | ICD-10-CM | POA: Diagnosis not present

## 2016-03-11 DIAGNOSIS — M5416 Radiculopathy, lumbar region: Secondary | ICD-10-CM | POA: Diagnosis not present

## 2016-03-11 DIAGNOSIS — M4806 Spinal stenosis, lumbar region: Secondary | ICD-10-CM | POA: Diagnosis not present

## 2016-03-12 DIAGNOSIS — J3089 Other allergic rhinitis: Secondary | ICD-10-CM | POA: Diagnosis not present

## 2016-03-12 DIAGNOSIS — J301 Allergic rhinitis due to pollen: Secondary | ICD-10-CM | POA: Diagnosis not present

## 2016-03-12 DIAGNOSIS — J3081 Allergic rhinitis due to animal (cat) (dog) hair and dander: Secondary | ICD-10-CM | POA: Diagnosis not present

## 2016-03-14 DIAGNOSIS — J301 Allergic rhinitis due to pollen: Secondary | ICD-10-CM | POA: Diagnosis not present

## 2016-03-14 DIAGNOSIS — J3081 Allergic rhinitis due to animal (cat) (dog) hair and dander: Secondary | ICD-10-CM | POA: Diagnosis not present

## 2016-03-14 DIAGNOSIS — J3089 Other allergic rhinitis: Secondary | ICD-10-CM | POA: Diagnosis not present

## 2016-03-21 DIAGNOSIS — J301 Allergic rhinitis due to pollen: Secondary | ICD-10-CM | POA: Diagnosis not present

## 2016-03-21 DIAGNOSIS — J3089 Other allergic rhinitis: Secondary | ICD-10-CM | POA: Diagnosis not present

## 2016-03-21 DIAGNOSIS — J3081 Allergic rhinitis due to animal (cat) (dog) hair and dander: Secondary | ICD-10-CM | POA: Diagnosis not present

## 2016-03-31 DIAGNOSIS — J3089 Other allergic rhinitis: Secondary | ICD-10-CM | POA: Diagnosis not present

## 2016-03-31 DIAGNOSIS — J301 Allergic rhinitis due to pollen: Secondary | ICD-10-CM | POA: Diagnosis not present

## 2016-03-31 DIAGNOSIS — J3081 Allergic rhinitis due to animal (cat) (dog) hair and dander: Secondary | ICD-10-CM | POA: Diagnosis not present

## 2016-04-07 DIAGNOSIS — J301 Allergic rhinitis due to pollen: Secondary | ICD-10-CM | POA: Diagnosis not present

## 2016-04-07 DIAGNOSIS — J3081 Allergic rhinitis due to animal (cat) (dog) hair and dander: Secondary | ICD-10-CM | POA: Diagnosis not present

## 2016-04-07 DIAGNOSIS — J3089 Other allergic rhinitis: Secondary | ICD-10-CM | POA: Diagnosis not present

## 2016-04-14 DIAGNOSIS — J301 Allergic rhinitis due to pollen: Secondary | ICD-10-CM | POA: Diagnosis not present

## 2016-04-14 DIAGNOSIS — J3089 Other allergic rhinitis: Secondary | ICD-10-CM | POA: Diagnosis not present

## 2016-04-14 DIAGNOSIS — J3081 Allergic rhinitis due to animal (cat) (dog) hair and dander: Secondary | ICD-10-CM | POA: Diagnosis not present

## 2016-04-21 DIAGNOSIS — Z23 Encounter for immunization: Secondary | ICD-10-CM | POA: Diagnosis not present

## 2016-04-21 DIAGNOSIS — J3089 Other allergic rhinitis: Secondary | ICD-10-CM | POA: Diagnosis not present

## 2016-04-21 DIAGNOSIS — J3081 Allergic rhinitis due to animal (cat) (dog) hair and dander: Secondary | ICD-10-CM | POA: Diagnosis not present

## 2016-04-21 DIAGNOSIS — J301 Allergic rhinitis due to pollen: Secondary | ICD-10-CM | POA: Diagnosis not present

## 2016-04-28 DIAGNOSIS — J301 Allergic rhinitis due to pollen: Secondary | ICD-10-CM | POA: Diagnosis not present

## 2016-04-28 DIAGNOSIS — J3089 Other allergic rhinitis: Secondary | ICD-10-CM | POA: Diagnosis not present

## 2016-05-08 DIAGNOSIS — J3089 Other allergic rhinitis: Secondary | ICD-10-CM | POA: Diagnosis not present

## 2016-05-08 DIAGNOSIS — J3081 Allergic rhinitis due to animal (cat) (dog) hair and dander: Secondary | ICD-10-CM | POA: Diagnosis not present

## 2016-05-08 DIAGNOSIS — J301 Allergic rhinitis due to pollen: Secondary | ICD-10-CM | POA: Diagnosis not present

## 2016-05-15 DIAGNOSIS — J3081 Allergic rhinitis due to animal (cat) (dog) hair and dander: Secondary | ICD-10-CM | POA: Diagnosis not present

## 2016-05-15 DIAGNOSIS — J3089 Other allergic rhinitis: Secondary | ICD-10-CM | POA: Diagnosis not present

## 2016-05-15 DIAGNOSIS — J301 Allergic rhinitis due to pollen: Secondary | ICD-10-CM | POA: Diagnosis not present

## 2016-05-23 DIAGNOSIS — J3089 Other allergic rhinitis: Secondary | ICD-10-CM | POA: Diagnosis not present

## 2016-05-23 DIAGNOSIS — J3081 Allergic rhinitis due to animal (cat) (dog) hair and dander: Secondary | ICD-10-CM | POA: Diagnosis not present

## 2016-05-23 DIAGNOSIS — J301 Allergic rhinitis due to pollen: Secondary | ICD-10-CM | POA: Diagnosis not present

## 2016-05-28 DIAGNOSIS — J301 Allergic rhinitis due to pollen: Secondary | ICD-10-CM | POA: Diagnosis not present

## 2016-05-28 DIAGNOSIS — J3081 Allergic rhinitis due to animal (cat) (dog) hair and dander: Secondary | ICD-10-CM | POA: Diagnosis not present

## 2016-05-28 DIAGNOSIS — J3089 Other allergic rhinitis: Secondary | ICD-10-CM | POA: Diagnosis not present

## 2016-06-03 DIAGNOSIS — R0781 Pleurodynia: Secondary | ICD-10-CM | POA: Diagnosis not present

## 2016-06-03 DIAGNOSIS — S20212A Contusion of left front wall of thorax, initial encounter: Secondary | ICD-10-CM | POA: Diagnosis not present

## 2016-06-03 DIAGNOSIS — W19XXXA Unspecified fall, initial encounter: Secondary | ICD-10-CM | POA: Diagnosis not present

## 2016-06-05 DIAGNOSIS — J3089 Other allergic rhinitis: Secondary | ICD-10-CM | POA: Diagnosis not present

## 2016-06-05 DIAGNOSIS — J301 Allergic rhinitis due to pollen: Secondary | ICD-10-CM | POA: Diagnosis not present

## 2016-06-05 DIAGNOSIS — J3081 Allergic rhinitis due to animal (cat) (dog) hair and dander: Secondary | ICD-10-CM | POA: Diagnosis not present

## 2016-06-17 ENCOUNTER — Encounter: Payer: Self-pay | Admitting: Adult Health

## 2016-06-17 ENCOUNTER — Ambulatory Visit (INDEPENDENT_AMBULATORY_CARE_PROVIDER_SITE_OTHER): Payer: Medicare Other | Admitting: Adult Health

## 2016-06-17 VITALS — BP 94/62 | Temp 98.3°F | Ht 63.0 in | Wt 113.1 lb

## 2016-06-17 DIAGNOSIS — F329 Major depressive disorder, single episode, unspecified: Secondary | ICD-10-CM | POA: Diagnosis not present

## 2016-06-17 DIAGNOSIS — F32A Depression, unspecified: Secondary | ICD-10-CM

## 2016-06-17 DIAGNOSIS — W19XXXS Unspecified fall, sequela: Secondary | ICD-10-CM

## 2016-06-17 DIAGNOSIS — J3081 Allergic rhinitis due to animal (cat) (dog) hair and dander: Secondary | ICD-10-CM | POA: Diagnosis not present

## 2016-06-17 DIAGNOSIS — J3089 Other allergic rhinitis: Secondary | ICD-10-CM | POA: Diagnosis not present

## 2016-06-17 DIAGNOSIS — J301 Allergic rhinitis due to pollen: Secondary | ICD-10-CM | POA: Diagnosis not present

## 2016-06-17 NOTE — Progress Notes (Signed)
Pre visit review using our clinic review tool, if applicable. No additional management support is needed unless otherwise documented below in the visit note. 

## 2016-06-17 NOTE — Progress Notes (Signed)
Subjective:    Patient ID: Jennifer Deleon, female    DOB: 09-26-1945, 70 y.o.   MRN: [id number]  HPI   70 year old female who  has a past medical history of Arthritis; Asthma; Chicken pox; Colon polyps; Colon polyps; Depression; Environmental allergies; Hypothyroid; Osteoporosis; and UTI (lower urinary tract infection). She presents to the office today with the complaint of depression. She feels as though her depression is stemming from low back pain with left sided sciatica. She has a history of lumbar fusion in 2015 and is currently being treated with steroid injections, which she reports is not helping. She has had many recent falls, the most recent being around thanksgiving where she had to be seen in the ER after falling in the kitchen onto wood flooring causing rib fracture on the left side. She reports that she lost her balance and fell ( I do not have these records). She feels as though she cannot be as active as she would like to be and is not enjoying life. She was recently seen by a physician at [facility] and they increased Prozac to 80 mg and Wellbutrin to 300mg . This was about 2 weeks ago and she denies any improvement.   She denies suicidal ideation  Review of Systems  Constitutional: Positive for activity change.  Respiratory: Negative.   Cardiovascular: Negative.   Musculoskeletal: Positive for arthralgias, back pain and gait problem. Negative for myalgias, neck pain and neck stiffness.  Skin: Negative.   Psychiatric/Behavioral: Negative for self-injury, sleep disturbance and suicidal ideas.       Depression    Past Medical History:  Diagnosis Date  . Arthritis   . Asthma   . Chicken pox   . Colon polyps   . Colon polyps   . Depression   . Environmental allergies    allergies all year long  . Hypothyroid   . Osteoporosis   . UTI (lower urinary tract infection)     Social History   Social History  . Marital status: Married    Spouse name: N/A  . Number of children:  N/A  . Years of education: N/A   Occupational History  . Not on file.   Social History Main Topics  . Smoking status: Never Smoker  . Smokeless tobacco: Never Used  . Alcohol use 4.2 oz/week    7 Glasses of wine per week     Comment: a glass of wine with dinner each night  . Drug use: No  . Sexual activity: Not on file   Other Topics Concern  . Not on file   Social History Narrative   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    Married for  11 years    [place] who live in [place]          Past Surgical History:  Procedure Laterality Date  . CATARACT EXTRACTION Bilateral 2016  . LAMINECTOMY    . LUMBAR FUSION  2015   l4-L5  . TUBAL LIGATION      Family History  Problem Relation Age of Onset  . Osteoarthritis Mother   . Osteoarthritis Maternal Grandmother   . Osteoarthritis Father   . Cancer Paternal Grandfather     Prostate Cancer mets to colon  . Hypertension Father   . Heart failure Father   . Arthritis    . Prostate cancer    . Mental illness      Allergies  Allergen Reactions  . Augmentin [Amoxicillin-Pot Clavulanate] Diarrhea and Other (See  Comments)    Severe Stomach cramps  . Bactrim [Sulfamethoxazole-Trimethoprim] Swelling    Facial and lip swelling    Current Outpatient Prescriptions on File Prior to Visit  Medication Sig Dispense Refill  . albuterol (PROVENTIL HFA;VENTOLIN HFA) 108 (90 BASE) MCG/ACT inhaler Inhale 2 puffs into the lungs every 6 (six) hours as needed for wheezing or shortness of breath.    Marland Kitchen azelastine (ASTELIN) 0.1 % nasal spray Place 1 spray into both nostrils 2 (two) times daily. Use in each nostril as directed    . beclomethasone (QVAR) 40 MCG/ACT inhaler Inhale 2 puffs into the lungs 2 (two) times daily.    Marland Kitchen buPROPion (WELLBUTRIN XL) 300 MG 24 hr tablet Take 300 mg by mouth every morning.    . calcium-vitamin D (OSCAL-500) 500-400 MG-UNIT per tablet Take 1 tablet by mouth 2 (two) times daily with a meal.    . cetirizine (ZYRTEC) 10  MG tablet Take 10 mg by mouth every morning.    . diphenhydrAMINE (BENADRYL) 25 MG tablet Take 25 mg by mouth every 6 (six) hours as needed for allergies.    Marland Kitchen docusate sodium (COLACE) 100 MG capsule Take 100 mg by mouth at bedtime.    . fexofenadine (ALLER-EASE) 180 MG tablet Take 180 mg by mouth every morning.    Marland Kitchen FLUoxetine (PROZAC) 40 MG capsule Take 80 mg by mouth every morning.     . gabapentin (NEURONTIN) 300 MG capsule Take 300 mg by mouth at bedtime.    Marland Kitchen ibuprofen (ADVIL,MOTRIN) 200 MG tablet Take 200-800 mg by mouth every 6 (six) hours as needed for pain.    Marland Kitchen levothyroxine (SYNTHROID, LEVOTHROID) 125 MCG tablet Take 125 mcg by mouth daily before breakfast.    . montelukast (SINGULAIR) 10 MG tablet Take 10 mg by mouth at bedtime.    . Multiple Vitamin (MULTIVITAMIN WITH MINERALS) TABS Take 1 tablet by mouth every morning.    Marland Kitchen olopatadine (PATANOL) 0.1 % ophthalmic solution Place 1 drop into both eyes 2 (two) times daily.     Marland Kitchen oxyCODONE (OXY IR/ROXICODONE) 5 MG immediate release tablet Take 1 tablet (5 mg total) by mouth every 4 (four) hours as needed for moderate pain. 45 tablet 0   No current facility-administered medications on file prior to visit.     BP 94/62 (BP Location: Left Arm, Patient Position: Sitting, Cuff Size: Normal)   Temp 98.3 F (36.8 C) (Oral)   Ht 5\' 3"  (1.6 m)   Wt 113 lb 1.6 oz (51.3 kg)   BMI 20.03 kg/m       Objective:   Physical Exam  Constitutional: She is oriented to person, place, and time. She appears well-developed and well-nourished. No distress.  Cardiovascular: Normal rate, regular rhythm, normal heart sounds and intact distal pulses.  Exam reveals no gallop and no friction rub.   No murmur heard. Pulmonary/Chest: Effort normal and breath sounds normal. No respiratory distress. She has no wheezes. She has no rales. She exhibits no tenderness.  Neurological: She is alert and oriented to person, place, and time.  Skin: Skin is warm and  dry. No rash noted. She is not diaphoretic. No erythema. No pallor.  Psychiatric: She has a normal mood and affect. Her behavior is normal. Judgment and thought content normal.  Nursing note and vitals reviewed.     Assessment & Plan:  1. Depression, unspecified depression type -Continue with current medications - List of local psychiatrists given to patient  - advised not to  flip flop between PCP's  - Go to the ER with any thoughts of SI or self harm  - Follow up as needed  2. Fall, sequela - I would like her to see neurology due to frequent falls. She does not want to do this at this time. She will speak to her spine specialist first  [name], NP

## 2016-06-18 DIAGNOSIS — M5416 Radiculopathy, lumbar region: Secondary | ICD-10-CM | POA: Diagnosis not present

## 2016-06-26 DIAGNOSIS — J3081 Allergic rhinitis due to animal (cat) (dog) hair and dander: Secondary | ICD-10-CM | POA: Diagnosis not present

## 2016-06-26 DIAGNOSIS — J301 Allergic rhinitis due to pollen: Secondary | ICD-10-CM | POA: Diagnosis not present

## 2016-06-26 DIAGNOSIS — J3089 Other allergic rhinitis: Secondary | ICD-10-CM | POA: Diagnosis not present

## 2016-07-03 DIAGNOSIS — J3089 Other allergic rhinitis: Secondary | ICD-10-CM | POA: Diagnosis not present

## 2016-07-03 DIAGNOSIS — J3081 Allergic rhinitis due to animal (cat) (dog) hair and dander: Secondary | ICD-10-CM | POA: Diagnosis not present

## 2016-07-03 DIAGNOSIS — J301 Allergic rhinitis due to pollen: Secondary | ICD-10-CM | POA: Diagnosis not present

## 2016-07-10 DIAGNOSIS — J3081 Allergic rhinitis due to animal (cat) (dog) hair and dander: Secondary | ICD-10-CM | POA: Diagnosis not present

## 2016-07-10 DIAGNOSIS — J3089 Other allergic rhinitis: Secondary | ICD-10-CM | POA: Diagnosis not present

## 2016-07-10 DIAGNOSIS — J301 Allergic rhinitis due to pollen: Secondary | ICD-10-CM | POA: Diagnosis not present

## 2016-07-18 DIAGNOSIS — J3081 Allergic rhinitis due to animal (cat) (dog) hair and dander: Secondary | ICD-10-CM | POA: Diagnosis not present

## 2016-07-18 DIAGNOSIS — J3089 Other allergic rhinitis: Secondary | ICD-10-CM | POA: Diagnosis not present

## 2016-07-18 DIAGNOSIS — J301 Allergic rhinitis due to pollen: Secondary | ICD-10-CM | POA: Diagnosis not present

## 2016-07-25 DIAGNOSIS — J301 Allergic rhinitis due to pollen: Secondary | ICD-10-CM | POA: Diagnosis not present

## 2016-07-25 DIAGNOSIS — J3081 Allergic rhinitis due to animal (cat) (dog) hair and dander: Secondary | ICD-10-CM | POA: Diagnosis not present

## 2016-07-25 DIAGNOSIS — J3089 Other allergic rhinitis: Secondary | ICD-10-CM | POA: Diagnosis not present

## 2016-08-01 DIAGNOSIS — J3089 Other allergic rhinitis: Secondary | ICD-10-CM | POA: Diagnosis not present

## 2016-08-01 DIAGNOSIS — J301 Allergic rhinitis due to pollen: Secondary | ICD-10-CM | POA: Diagnosis not present

## 2016-08-01 DIAGNOSIS — J3081 Allergic rhinitis due to animal (cat) (dog) hair and dander: Secondary | ICD-10-CM | POA: Diagnosis not present

## 2016-08-08 DIAGNOSIS — J3081 Allergic rhinitis due to animal (cat) (dog) hair and dander: Secondary | ICD-10-CM | POA: Diagnosis not present

## 2016-08-08 DIAGNOSIS — J3089 Other allergic rhinitis: Secondary | ICD-10-CM | POA: Diagnosis not present

## 2016-08-08 DIAGNOSIS — J301 Allergic rhinitis due to pollen: Secondary | ICD-10-CM | POA: Diagnosis not present

## 2016-08-19 DIAGNOSIS — J3081 Allergic rhinitis due to animal (cat) (dog) hair and dander: Secondary | ICD-10-CM | POA: Diagnosis not present

## 2016-08-19 DIAGNOSIS — J301 Allergic rhinitis due to pollen: Secondary | ICD-10-CM | POA: Diagnosis not present

## 2016-08-19 DIAGNOSIS — J3089 Other allergic rhinitis: Secondary | ICD-10-CM | POA: Diagnosis not present

## 2016-08-22 DIAGNOSIS — J301 Allergic rhinitis due to pollen: Secondary | ICD-10-CM | POA: Diagnosis not present

## 2016-08-22 DIAGNOSIS — J3081 Allergic rhinitis due to animal (cat) (dog) hair and dander: Secondary | ICD-10-CM | POA: Diagnosis not present

## 2016-08-22 DIAGNOSIS — J3089 Other allergic rhinitis: Secondary | ICD-10-CM | POA: Diagnosis not present

## 2016-08-27 DIAGNOSIS — J3081 Allergic rhinitis due to animal (cat) (dog) hair and dander: Secondary | ICD-10-CM | POA: Diagnosis not present

## 2016-08-27 DIAGNOSIS — J301 Allergic rhinitis due to pollen: Secondary | ICD-10-CM | POA: Diagnosis not present

## 2016-08-27 DIAGNOSIS — J3089 Other allergic rhinitis: Secondary | ICD-10-CM | POA: Diagnosis not present

## 2016-08-28 ENCOUNTER — Ambulatory Visit (INDEPENDENT_AMBULATORY_CARE_PROVIDER_SITE_OTHER): Payer: Medicare Other | Admitting: Adult Health

## 2016-08-28 VITALS — BP 120/64 | Temp 97.8°F

## 2016-08-28 DIAGNOSIS — R42 Dizziness and giddiness: Secondary | ICD-10-CM

## 2016-08-28 DIAGNOSIS — S0990XA Unspecified injury of head, initial encounter: Secondary | ICD-10-CM | POA: Diagnosis not present

## 2016-08-28 MED ORDER — MECLIZINE HCL 12.5 MG PO TABS: 12.5000 mg | ORAL_TABLET | Freq: Three times a day (TID) | ORAL | 1 refills | Status: DC | PRN

## 2016-08-28 NOTE — Progress Notes (Signed)
Subjective:    Patient ID: Jennifer Deleon, female    DOB: [birth year], 71 y.o.   MRN: [id number]  HPI  71 year old female who  has a past medical history of Arthritis; Asthma; Chicken pox; Colon polyps; Colon polyps; Depression; Environmental allergies; Hypothyroid; Osteoporosis; and UTI (lower urinary tract infection). He presents to the office today with the acute complaint of 24 hours of dizziness. She reports that she woke up yesterday morning and felt as though the room was spinning, sensation has not dissipated over the last 24 hours. His any vomiting but feels as though at times she is nauseated  Additionally approximately 1 week ago she fell at home after tripping over the vacuum cleaner striking the right side of her head on the coffee table. She denies any loss of consciousness, headaches, syncopal episodes, facial droop, slurred speech, or blurred vision. She has not had any dizziness over the week until 24 hours ago.   Review of Systems  Constitutional: Positive for activity change.  Eyes: Negative.   Respiratory: Negative.   Cardiovascular: Negative.   Musculoskeletal: Negative for neck pain and neck stiffness.  Skin: Positive for color change.  Neurological: Positive for dizziness. Negative for tremors, seizures, syncope, facial asymmetry, speech difficulty, weakness, light-headedness, numbness and headaches.   Past Medical History:  Diagnosis Date  . Arthritis   . Asthma   . Chicken pox   . Colon polyps   . Colon polyps   . Depression   . Environmental allergies    allergies all year long  . Hypothyroid   . Osteoporosis   . UTI (lower urinary tract infection)     Social History   Social History  . Marital status: Married    Spouse name: N/A  . Number of children: N/A  . Years of education: N/A   Occupational History  . Not on file.   Social History Main Topics  . Smoking status: Never Smoker  . Smokeless tobacco: Never Used  . Alcohol use 4.2 oz/week      7 Glasses of wine per week     Comment: a glass of wine with dinner each night  . Drug use: No  . Sexual activity: Not on file   Other Topics Concern  . Not on file   Social History Narrative   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ighter    Married for  11 years    [place] who live in [place]          Past Surgical History:  Procedure Laterality Date  . CATARACT EXTRACTION Bilateral 2016  . LAMINECTOMY    . LUMBAR FUSION  2015   l4-L5  . TUBAL LIGATION      Family History  Problem Relation Age of Onset  . Osteoarthritis Mother   . Osteoarthritis Maternal Grandmother   . Osteoarthritis Father   . Cancer Paternal Grandfather     Prostate Cancer mets to colon  . Hypertension Father   . Heart failure Father   . Arthritis    . Prostate cancer    . Mental illness      Allergies  Allergen Reactions  . Augmentin [Amoxicillin-Pot Clavulanate] Diarrhea and Other (See Comments)    Severe Stomach cramps  . Bactrim [Sulfamethoxazole-Trimethoprim] Swelling    Facial and lip swelling    Current Outpatient Prescriptions on File Prior to Visit  Medication Sig Dispense Refill  . albuterol (PROVENTIL HFA;VENTOLIN HFA) 108 (90 BASE) MCG/ACT inhaler Inhale 2 puffs into the lungs every 6 (  six) hours as needed for wheezing or shortness of breath.    Marland Kitchen azelastine (ASTELIN) 0.1 % nasal spray Place 1 spray into both nostrils 2 (two) times daily. Use in each nostril as directed    . beclomethasone (QVAR) 40 MCG/ACT inhaler Inhale 2 puffs into the lungs 2 (two) times daily.    Marland Kitchen buPROPion (WELLBUTRIN XL) 300 MG 24 hr tablet Take 300 mg by mouth every morning.    . calcium-vitamin D (OSCAL-500) 500-400 MG-UNIT per tablet Take 1 tablet by mouth 2 (two) times daily with a meal.    . cetirizine (ZYRTEC) 10 MG tablet Take 10 mg by mouth every morning.    . diphenhydrAMINE (BENADRYL) 25 MG tablet Take 25 mg by mouth every 6 (six) hours as needed for allergies.    Marland Kitchen docusate sodium (COLACE) 100 MG  capsule Take 100 mg by mouth at bedtime.    . fexofenadine (ALLER-EASE) 180 MG tablet Take 180 mg by mouth every morning.    Marland Kitchen FLUoxetine (PROZAC) 40 MG capsule Take 80 mg by mouth every morning.     . gabapentin (NEURONTIN) 300 MG capsule Take 300 mg by mouth at bedtime.    Marland Kitchen ibuprofen (ADVIL,MOTRIN) 200 MG tablet Take 200-800 mg by mouth every 6 (six) hours as needed for pain.    Marland Kitchen levothyroxine (SYNTHROID, LEVOTHROID) 125 MCG tablet Take 125 mcg by mouth daily before breakfast.    . montelukast (SINGULAIR) 10 MG tablet Take 10 mg by mouth at bedtime.    . Multiple Vitamin (MULTIVITAMIN WITH MINERALS) TABS Take 1 tablet by mouth every morning.     No current facility-administered medications on file prior to visit.     BP 120/64 (BP Location: Left Arm, Patient Position: Sitting, Cuff Size: Normal)   Temp 97.8 F (36.6 C) (Oral)       Objective:   Physical Exam  Constitutional: She is oriented to person, place, and time. She appears well-developed and well-nourished. No distress.  HENT:  Head: Head is with contusion. Head is without Battle's sign.    Eyes: Conjunctivae are normal. Pupils are equal, round, and reactive to light. Right eye exhibits nystagmus (Horizontal). Left eye exhibits nystagmus (horizontal ).  Cardiovascular: Normal rate, regular rhythm, normal heart sounds and intact distal pulses.  Exam reveals no gallop and no friction rub.   No murmur heard. Pulmonary/Chest: Effort normal and breath sounds normal. No respiratory distress. She has no wheezes. She has no rales. She exhibits no tenderness.  Neurological: She is alert and oriented to person, place, and time. She has normal strength and normal reflexes. She displays normal reflexes. No cranial nerve deficit or sensory deficit. She exhibits normal muscle tone. Coordination and gait normal. GCS eye subscore is 4. GCS verbal subscore is 5. GCS motor subscore is 6.  Skin: Skin is warm and dry. Bruising and ecchymosis  noted. No rash noted. She is not diaphoretic. No erythema. No pallor.  Psychiatric: She has a normal mood and affect. Her behavior is normal. Judgment and thought content normal.  Nursing note and vitals reviewed.     Assessment & Plan:  1. Traumatic injury of head, initial encounter - She does have a small deformity to her right forehead. Hard to touch. There is no facial droop and she has equal bilateral sensation, no slurred speech. I would still like her to go to the emergency room to further evaluated but she is not want to do this at this time. Will order stat  head CT to rule out any orbital fracture - CT Head Wo Contrast; Future - Advised if she should experience any blurred vision, headaches, or strokelike symptoms to go immediately to the emergency room  2. Vertigo - meclizine (ANTIVERT) 12.5 MG tablet; Take 1 tablet (12.5 mg total) by mouth 3 (three) times daily as needed for dizziness.  Dispense: 30 tablet; Refill: 1 - Handout given on Epley maneuver - Follow-up if no improvement  [name], NP

## 2016-08-29 ENCOUNTER — Ambulatory Visit (INDEPENDENT_AMBULATORY_CARE_PROVIDER_SITE_OTHER)
Admission: RE | Admit: 2016-08-29 | Discharge: 2016-08-29 | Disposition: A | Discharge status: Home / Self Care | Payer: Medicare Other | Source: Ambulatory Visit | Attending: Adult Health | Admitting: Adult Health

## 2016-08-29 DIAGNOSIS — S0990XA Unspecified injury of head, initial encounter: Secondary | ICD-10-CM

## 2016-09-03 ENCOUNTER — Telehealth: Payer: Self-pay | Admitting: Adult Health

## 2016-09-03 DIAGNOSIS — J3081 Allergic rhinitis due to animal (cat) (dog) hair and dander: Secondary | ICD-10-CM | POA: Diagnosis not present

## 2016-09-03 DIAGNOSIS — J3089 Other allergic rhinitis: Secondary | ICD-10-CM | POA: Diagnosis not present

## 2016-09-03 DIAGNOSIS — J301 Allergic rhinitis due to pollen: Secondary | ICD-10-CM | POA: Diagnosis not present

## 2016-09-03 NOTE — Telephone Encounter (Signed)
Updated patient on her CT scan.   She is happy to report that she is no longer experiencing vertigo

## 2016-09-09 DIAGNOSIS — J3081 Allergic rhinitis due to animal (cat) (dog) hair and dander: Secondary | ICD-10-CM | POA: Diagnosis not present

## 2016-09-09 DIAGNOSIS — J3089 Other allergic rhinitis: Secondary | ICD-10-CM | POA: Diagnosis not present

## 2016-09-09 DIAGNOSIS — J301 Allergic rhinitis due to pollen: Secondary | ICD-10-CM | POA: Diagnosis not present

## 2016-09-17 DIAGNOSIS — J3081 Allergic rhinitis due to animal (cat) (dog) hair and dander: Secondary | ICD-10-CM | POA: Diagnosis not present

## 2016-09-17 DIAGNOSIS — J3089 Other allergic rhinitis: Secondary | ICD-10-CM | POA: Diagnosis not present

## 2016-09-17 DIAGNOSIS — J301 Allergic rhinitis due to pollen: Secondary | ICD-10-CM | POA: Diagnosis not present

## 2016-09-18 DIAGNOSIS — J3089 Other allergic rhinitis: Secondary | ICD-10-CM | POA: Diagnosis not present

## 2016-09-18 DIAGNOSIS — J301 Allergic rhinitis due to pollen: Secondary | ICD-10-CM | POA: Diagnosis not present

## 2016-09-18 DIAGNOSIS — J3081 Allergic rhinitis due to animal (cat) (dog) hair and dander: Secondary | ICD-10-CM | POA: Diagnosis not present

## 2016-09-18 DIAGNOSIS — H1045 Other chronic allergic conjunctivitis: Secondary | ICD-10-CM | POA: Diagnosis not present

## 2016-09-24 DIAGNOSIS — J3089 Other allergic rhinitis: Secondary | ICD-10-CM | POA: Diagnosis not present

## 2016-09-24 DIAGNOSIS — J301 Allergic rhinitis due to pollen: Secondary | ICD-10-CM | POA: Diagnosis not present

## 2016-09-24 DIAGNOSIS — J3081 Allergic rhinitis due to animal (cat) (dog) hair and dander: Secondary | ICD-10-CM | POA: Diagnosis not present

## 2016-09-30 DIAGNOSIS — F4323 Adjustment disorder with mixed anxiety and depressed mood: Secondary | ICD-10-CM | POA: Diagnosis not present

## 2016-10-01 DIAGNOSIS — M79671 Pain in right foot: Secondary | ICD-10-CM | POA: Diagnosis not present

## 2016-10-01 DIAGNOSIS — M2011 Hallux valgus (acquired), right foot: Secondary | ICD-10-CM | POA: Diagnosis not present

## 2016-10-01 DIAGNOSIS — M21621 Bunionette of right foot: Secondary | ICD-10-CM | POA: Diagnosis not present

## 2016-10-01 DIAGNOSIS — M2140 Flat foot [pes planus] (acquired), unspecified foot: Secondary | ICD-10-CM | POA: Diagnosis not present

## 2016-10-06 DIAGNOSIS — J301 Allergic rhinitis due to pollen: Secondary | ICD-10-CM | POA: Diagnosis not present

## 2016-10-06 DIAGNOSIS — J3089 Other allergic rhinitis: Secondary | ICD-10-CM | POA: Diagnosis not present

## 2016-10-06 DIAGNOSIS — J3081 Allergic rhinitis due to animal (cat) (dog) hair and dander: Secondary | ICD-10-CM | POA: Diagnosis not present

## 2016-10-15 DIAGNOSIS — J3081 Allergic rhinitis due to animal (cat) (dog) hair and dander: Secondary | ICD-10-CM | POA: Diagnosis not present

## 2016-10-15 DIAGNOSIS — J301 Allergic rhinitis due to pollen: Secondary | ICD-10-CM | POA: Diagnosis not present

## 2016-10-15 DIAGNOSIS — J3089 Other allergic rhinitis: Secondary | ICD-10-CM | POA: Diagnosis not present

## 2016-10-16 DIAGNOSIS — F4323 Adjustment disorder with mixed anxiety and depressed mood: Secondary | ICD-10-CM | POA: Diagnosis not present

## 2016-10-20 DIAGNOSIS — J3089 Other allergic rhinitis: Secondary | ICD-10-CM | POA: Diagnosis not present

## 2016-10-20 DIAGNOSIS — J301 Allergic rhinitis due to pollen: Secondary | ICD-10-CM | POA: Diagnosis not present

## 2016-10-20 DIAGNOSIS — J3081 Allergic rhinitis due to animal (cat) (dog) hair and dander: Secondary | ICD-10-CM | POA: Diagnosis not present

## 2016-10-23 DIAGNOSIS — M2011 Hallux valgus (acquired), right foot: Secondary | ICD-10-CM | POA: Diagnosis not present

## 2016-10-23 DIAGNOSIS — M722 Plantar fascial fibromatosis: Secondary | ICD-10-CM | POA: Diagnosis not present

## 2016-10-23 DIAGNOSIS — M2012 Hallux valgus (acquired), left foot: Secondary | ICD-10-CM | POA: Diagnosis not present

## 2016-10-23 DIAGNOSIS — M79671 Pain in right foot: Secondary | ICD-10-CM | POA: Diagnosis not present

## 2016-10-27 DIAGNOSIS — J3089 Other allergic rhinitis: Secondary | ICD-10-CM | POA: Diagnosis not present

## 2016-10-27 DIAGNOSIS — J301 Allergic rhinitis due to pollen: Secondary | ICD-10-CM | POA: Diagnosis not present

## 2016-10-27 DIAGNOSIS — J3081 Allergic rhinitis due to animal (cat) (dog) hair and dander: Secondary | ICD-10-CM | POA: Diagnosis not present

## 2016-10-31 DIAGNOSIS — F4323 Adjustment disorder with mixed anxiety and depressed mood: Secondary | ICD-10-CM | POA: Diagnosis not present

## 2016-11-03 DIAGNOSIS — J3089 Other allergic rhinitis: Secondary | ICD-10-CM | POA: Diagnosis not present

## 2016-11-03 DIAGNOSIS — J301 Allergic rhinitis due to pollen: Secondary | ICD-10-CM | POA: Diagnosis not present

## 2016-11-03 DIAGNOSIS — J3081 Allergic rhinitis due to animal (cat) (dog) hair and dander: Secondary | ICD-10-CM | POA: Diagnosis not present

## 2016-11-05 DIAGNOSIS — M79671 Pain in right foot: Secondary | ICD-10-CM | POA: Diagnosis not present

## 2016-11-05 DIAGNOSIS — M722 Plantar fascial fibromatosis: Secondary | ICD-10-CM | POA: Diagnosis not present

## 2016-11-05 DIAGNOSIS — M2011 Hallux valgus (acquired), right foot: Secondary | ICD-10-CM | POA: Diagnosis not present

## 2016-11-05 DIAGNOSIS — M2012 Hallux valgus (acquired), left foot: Secondary | ICD-10-CM | POA: Diagnosis not present

## 2016-11-17 DIAGNOSIS — J3081 Allergic rhinitis due to animal (cat) (dog) hair and dander: Secondary | ICD-10-CM | POA: Diagnosis not present

## 2016-11-17 DIAGNOSIS — J301 Allergic rhinitis due to pollen: Secondary | ICD-10-CM | POA: Diagnosis not present

## 2016-11-17 DIAGNOSIS — J3089 Other allergic rhinitis: Secondary | ICD-10-CM | POA: Diagnosis not present

## 2016-11-25 DIAGNOSIS — J301 Allergic rhinitis due to pollen: Secondary | ICD-10-CM | POA: Diagnosis not present

## 2016-11-25 DIAGNOSIS — J3089 Other allergic rhinitis: Secondary | ICD-10-CM | POA: Diagnosis not present

## 2016-11-25 DIAGNOSIS — H52203 Unspecified astigmatism, bilateral: Secondary | ICD-10-CM | POA: Diagnosis not present

## 2016-11-25 DIAGNOSIS — J3081 Allergic rhinitis due to animal (cat) (dog) hair and dander: Secondary | ICD-10-CM | POA: Diagnosis not present

## 2016-11-25 DIAGNOSIS — Z961 Presence of intraocular lens: Secondary | ICD-10-CM | POA: Diagnosis not present

## 2016-12-04 DIAGNOSIS — J301 Allergic rhinitis due to pollen: Secondary | ICD-10-CM | POA: Diagnosis not present

## 2016-12-04 DIAGNOSIS — J3089 Other allergic rhinitis: Secondary | ICD-10-CM | POA: Diagnosis not present

## 2016-12-04 DIAGNOSIS — J3081 Allergic rhinitis due to animal (cat) (dog) hair and dander: Secondary | ICD-10-CM | POA: Diagnosis not present

## 2016-12-16 DIAGNOSIS — J3081 Allergic rhinitis due to animal (cat) (dog) hair and dander: Secondary | ICD-10-CM | POA: Diagnosis not present

## 2016-12-16 DIAGNOSIS — J301 Allergic rhinitis due to pollen: Secondary | ICD-10-CM | POA: Diagnosis not present

## 2016-12-16 DIAGNOSIS — J3089 Other allergic rhinitis: Secondary | ICD-10-CM | POA: Diagnosis not present

## 2016-12-18 ENCOUNTER — Telehealth: Payer: Self-pay | Admitting: Adult Health

## 2016-12-18 NOTE — Telephone Encounter (Signed)
° °  Pt went to see a audiologist and she would like to give the pt a hearing examine so they are requesting a referral . [facility] and the doctor is Lollie Sails         Fax [phone]

## 2016-12-19 ENCOUNTER — Other Ambulatory Visit: Payer: Self-pay | Admitting: Adult Health

## 2016-12-19 DIAGNOSIS — Z0110 Encounter for examination of ears and hearing without abnormal findings: Secondary | ICD-10-CM

## 2016-12-19 NOTE — Telephone Encounter (Signed)
Referral has been placed. 

## 2016-12-19 NOTE — Telephone Encounter (Signed)
Is this ok?

## 2016-12-19 NOTE — Telephone Encounter (Signed)
Ok for referral?

## 2016-12-24 DIAGNOSIS — J301 Allergic rhinitis due to pollen: Secondary | ICD-10-CM | POA: Diagnosis not present

## 2016-12-24 DIAGNOSIS — J3089 Other allergic rhinitis: Secondary | ICD-10-CM | POA: Diagnosis not present

## 2016-12-24 DIAGNOSIS — J3081 Allergic rhinitis due to animal (cat) (dog) hair and dander: Secondary | ICD-10-CM | POA: Diagnosis not present

## 2016-12-29 DIAGNOSIS — M5416 Radiculopathy, lumbar region: Secondary | ICD-10-CM | POA: Diagnosis not present

## 2017-01-01 DIAGNOSIS — J301 Allergic rhinitis due to pollen: Secondary | ICD-10-CM | POA: Diagnosis not present

## 2017-01-01 DIAGNOSIS — J3081 Allergic rhinitis due to animal (cat) (dog) hair and dander: Secondary | ICD-10-CM | POA: Diagnosis not present

## 2017-01-01 DIAGNOSIS — J3089 Other allergic rhinitis: Secondary | ICD-10-CM | POA: Diagnosis not present

## 2017-01-06 DIAGNOSIS — H903 Sensorineural hearing loss, bilateral: Secondary | ICD-10-CM | POA: Diagnosis not present

## 2017-01-09 DIAGNOSIS — J3089 Other allergic rhinitis: Secondary | ICD-10-CM | POA: Diagnosis not present

## 2017-01-09 DIAGNOSIS — J301 Allergic rhinitis due to pollen: Secondary | ICD-10-CM | POA: Diagnosis not present

## 2017-01-09 DIAGNOSIS — J3081 Allergic rhinitis due to animal (cat) (dog) hair and dander: Secondary | ICD-10-CM | POA: Diagnosis not present

## 2017-01-20 ENCOUNTER — Ambulatory Visit (INDEPENDENT_AMBULATORY_CARE_PROVIDER_SITE_OTHER): Payer: Medicare Other | Admitting: Adult Health

## 2017-01-20 ENCOUNTER — Encounter: Payer: Self-pay | Admitting: Adult Health

## 2017-01-20 DIAGNOSIS — J3081 Allergic rhinitis due to animal (cat) (dog) hair and dander: Secondary | ICD-10-CM | POA: Diagnosis not present

## 2017-01-20 DIAGNOSIS — J3089 Other allergic rhinitis: Secondary | ICD-10-CM | POA: Diagnosis not present

## 2017-01-20 DIAGNOSIS — J301 Allergic rhinitis due to pollen: Secondary | ICD-10-CM | POA: Diagnosis not present

## 2017-01-20 DIAGNOSIS — R197 Diarrhea, unspecified: Secondary | ICD-10-CM

## 2017-01-20 LAB — BASIC METABOLIC PANEL
BUN: 12 mg/dL (ref 6–23)
CO2: 29 mEq/L (ref 19–32)
CREATININE: 0.76 mg/dL (ref 0.40–1.20)
Calcium: 9.6 mg/dL (ref 8.4–10.5)
Chloride: 104 mEq/L (ref 96–112)
GFR: 79.37 mL/min (ref >60.00)
GLUCOSE: 83 mg/dL (ref 70–99)
Potassium: 4.2 mEq/L (ref 3.5–5.1)
Sodium: 139 mEq/L (ref 135–145)

## 2017-01-20 LAB — CBC WITH DIFFERENTIAL/PLATELET
BASOS PCT: 0.6 % (ref 0.0–3.0)
Basophils Absolute: 0 10*3/uL (ref 0.0–0.1)
EOS PCT: 2.3 % (ref 0.0–5.0)
Eosinophils Absolute: 0.2 10*3/uL (ref 0.0–0.7)
HCT: 38.4 % (ref 36.0–46.0)
HEMOGLOBIN: 12.8 g/dL (ref 12.0–15.0)
Lymphocytes Relative: 25.5 % (ref 12.0–46.0)
Lymphs Abs: 1.7 10*3/uL (ref 0.7–4.0)
MCHC: 33.3 g/dL (ref 30.0–36.0)
MCV: 94.1 fl (ref 78.0–100.0)
Monocytes Absolute: 0.8 10*3/uL (ref 0.1–1.0)
Monocytes Relative: 11.8 % (ref 3.0–12.0)
NEUTROS ABS: 4 10*3/uL (ref 1.4–7.7)
Neutrophils Relative %: 59.8 % (ref 43.0–77.0)
Platelets: 322 10*3/uL (ref 150.0–400.0)
RBC: 4.08 Mil/uL (ref 3.87–5.11)
RDW: 13.9 % (ref 11.5–15.5)
WBC: 6.7 10*3/uL (ref 4.0–10.5)

## 2017-01-20 MED ORDER — DIPHENOXYLATE-ATROPINE 2.5-0.025 MG PO TABS: 1.0000 | ORAL_TABLET | Freq: Four times a day (QID) | ORAL | 1 refills | Status: DC | PRN

## 2017-01-20 NOTE — Progress Notes (Signed)
 Subjective:    Patient ID: Jennifer Deleon, female    DOB: 09/21/1945, 70 y.o.   MRN: [id number]  HPI  70 year old female who  has a past medical history of Arthritis; Asthma; Chicken pox; Colon polyps; Colon polyps; Depression; Environmental allergies; Hypothyroid; Osteoporosis; and UTI (lower urinary tract infection). She presents to the office today for the acute complaint of abdominal cramping, diarrhea, and nausea consistently for 2 weeks.   She denies any fevers or recent antibiotics. She denies any blood in stool and has not had any recent travel.   She has been using Imodium and eating a lot of yogurt without not resolving    Review of Systems  Constitutional: Negative.   Respiratory: Negative.   Cardiovascular: Negative.   Gastrointestinal: Positive for abdominal pain, blood in stool, constipation, diarrhea and nausea. Negative for vomiting.  All other systems reviewed and are negative.  Past Medical History:  Diagnosis Date  . Arthritis   . Asthma   . Chicken pox   . Colon polyps   . Colon polyps   . Depression   . Environmental allergies    allergies all year long  . Hypothyroid   . Osteoporosis   . UTI (lower urinary tract infection)     Social History   Social History  . Marital status: Married    Spouse name: N/A  . Number of children: N/A  . Years of education: N/A   Occupational History  . Not on file.   Social History Main Topics  . Smoking status: Never Smoker  . Smokeless tobacco: Never Used  . Alcohol use 4.2 oz/week    7 Glasses of wine per week     Comment: a glass of wine with dinner each night  . Drug use: No  . Sexual activity: Not on file   Other Topics Concern  . Not on file   Social History Narrative   Retired from [employer]    Married for  11 years    Children who live in [place]          Past Surgical History:  Procedure Laterality Date  . CATARACT EXTRACTION Bilateral 2016  . LAMINECTOMY    . LUMBAR FUSION   2015   l4-L5  . TUBAL LIGATION      Family History  Problem Relation Age of Onset  . Osteoarthritis Mother   . Osteoarthritis Maternal Grandmother   . Osteoarthritis Father   . Cancer Paternal Grandfather        Prostate Cancer mets to colon  . Hypertension Father   . Heart failure Father   . Arthritis Unknown   . Prostate cancer Unknown   . Mental illness Unknown     Allergies  Allergen Reactions  . Augmentin [Amoxicillin-Pot Clavulanate] Diarrhea and Other (See Comments)    Severe Stomach cramps  . Bactrim [Sulfamethoxazole-Trimethoprim] Swelling    Facial and lip swelling    Current Outpatient Prescriptions on File Prior to Visit  Medication Sig Dispense Refill  . albuterol (PROVENTIL HFA;VENTOLIN HFA) 108 (90 BASE) MCG/ACT inhaler Inhale 2 puffs into the lungs every 6 (six) hours as needed for wheezing or shortness of breath.    . azelastine (ASTELIN) 0.1 % nasal spray Place 1 spray into both nostrils 2 (two) times daily. Use in each nostril as directed    . beclomethasone (QVAR) 40 MCG/ACT inhaler Inhale 2 puffs into the lungs 2 (two) times daily.    . buPROPion (WELLBUTRIN XL)   300 MG 24 hr tablet Take 300 mg by mouth every morning.    . calcium-vitamin D (OSCAL-500) 500-400 MG-UNIT per tablet Take 1 tablet by mouth 2 (two) times daily with a meal.    . cetirizine (ZYRTEC) 10 MG tablet Take 10 mg by mouth every morning.    . diphenhydrAMINE (BENADRYL) 25 MG tablet Take 25 mg by mouth every 6 (six) hours as needed for allergies.    . docusate sodium (COLACE) 100 MG capsule Take 100 mg by mouth at bedtime.    . fexofenadine (ALLER-EASE) 180 MG tablet Take 180 mg by mouth every morning.    . gabapentin (NEURONTIN) 300 MG capsule Take 300 mg by mouth at bedtime.    . ibuprofen (ADVIL,MOTRIN) 200 MG tablet Take 200-800 mg by mouth every 6 (six) hours as needed for pain.    . levothyroxine (SYNTHROID, LEVOTHROID) 125 MCG tablet Take 125 mcg by mouth daily before breakfast.      . montelukast (SINGULAIR) 10 MG tablet Take 10 mg by mouth at bedtime.    . Multiple Vitamin (MULTIVITAMIN WITH MINERALS) TABS Take 1 tablet by mouth every morning.     No current facility-administered medications on file prior to visit.     BP (!) 120/58 (BP Location: Left Arm, Patient Position: Sitting, Cuff Size: Normal)   Pulse 78   Temp 98.5 F (36.9 C) (Oral)   Ht 5' 3" (1.6 m)   Wt 115 lb 6.4 oz (52.3 kg)   SpO2 98%   BMI 20.44 kg/m       Objective:   Physical Exam  Constitutional: She is oriented to person, place, and time. She appears well-developed and well-nourished. No distress.  Cardiovascular: Normal rate, regular rhythm, normal heart sounds and intact distal pulses.  Exam reveals no gallop and no friction rub.   No murmur heard. Pulmonary/Chest: Effort normal and breath sounds normal. No respiratory distress. She has no wheezes. She exhibits no tenderness.  Abdominal: Bowel sounds are normal. She exhibits no distension and no mass. There is no splenomegaly or hepatomegaly. There is tenderness in the epigastric area and periumbilical area. There is no rebound, no guarding and no CVA tenderness.  Neurological: She is alert and oriented to person, place, and time.  Skin: Skin is warm and dry. No rash noted. She is not diaphoretic. No erythema. No pallor.  Psychiatric: She has a normal mood and affect. Her behavior is normal. Judgment and thought content normal.  Nursing note and vitals reviewed.      Assessment & Plan:  1. Diarrhea, unspecified type - Switch from Imodium to Pepto Bismol. Will prescribe Lomotil if needed.  - Clear liquids for the next 24-36 hours  - Gastrointestinal Pathogen Panel PCR - diphenoxylate-atropine (LOMOTIL) 2.5-0.025 MG tablet; Take 1 tablet by mouth 4 (four) times daily as needed for diarrhea or loose stools.  Dispense: 30 tablet; Refill: 1 - CBC with Differential/Platelet - BMP with eGFR - Will treat as needed depending on GI  pathogen panel    , NP    

## 2017-01-20 NOTE — Addendum Note (Signed)
Addended by: Tomi Likens on: 01/20/2017 10:29 AM   Modules accepted: Orders

## 2017-01-20 NOTE — Patient Instructions (Addendum)
It was great seeing you today but I am sorry you are feeling this way  Please start using Pepto Bismol   I have prescribed Lomotil if you needed it  I will follow up with you regarding your blood work and stool studies    Food Choices to Help Relieve Diarrhea, Adult When you have diarrhea, the foods you eat and your eating habits are very important. Choosing the right foods and drinks can help:  Relieve diarrhea.  Replace lost fluids and nutrients.  Prevent dehydration.  What general guidelines should I follow? Relieving diarrhea  Choose foods with less than 2 g or .07 oz. of fiber per serving.  Limit fats to less than 8 tsp (38 g or 1.34 oz.) a day.  Avoid the following: ? Foods and beverages sweetened with high-fructose corn syrup, honey, or sugar alcohols such as xylitol, sorbitol, and mannitol. ? Foods that contain a lot of fat or sugar. ? Fried, greasy, or spicy foods. ? High-fiber grains, breads, and cereals. ? Raw fruits and vegetables.  Eat foods that are rich in probiotics. These foods include dairy products such as yogurt and fermented milk products. They help increase healthy bacteria in the stomach and intestines (gastrointestinal tract, or GI tract).  If you have lactose intolerance, avoid dairy products. These may make your diarrhea worse.  Take medicine to help stop diarrhea (antidiarrheal medicine) only as told by your health care provider. Replacing nutrients  Eat small meals or snacks every 3-4 hours.  Eat bland foods, such as white rice, toast, or baked potato, until your diarrhea starts to get better. Gradually reintroduce nutrient-rich foods as tolerated or as told by your health care provider. This includes: ? Well-cooked protein foods. ? Peeled, seeded, and soft-cooked fruits and vegetables. ? Low-fat dairy products.  Take vitamin and mineral supplements as told by your health care provider. Preventing dehydration   Start by sipping water or a  special solution to prevent dehydration (oral rehydration solution, ORS). Urine that is clear or pale yellow means that you are getting enough fluid.  Try to drink at least 8-10 cups of fluid each day to help replace lost fluids.  You may add other liquids in addition to water, such as clear juice or decaffeinated sports drinks, as tolerated or as told by your health care provider.  Avoid drinks with caffeine, such as coffee, tea, or soft drinks.  Avoid alcohol. What foods are recommended? The items listed may not be a complete list. Talk with your health care provider about what dietary choices are best for you. Grains White rice. White, [place], or pita breads (fresh or toasted), including plain rolls, buns, or bagels. White pasta. Saltine, soda, or graham crackers. Pretzels. Low-fiber cereal. Cooked cereals made with water (such as cornmeal, farina, or cream cereals). Plain muffins. Matzo. Melba toast. Zwieback. Vegetables Potatoes (without the skin). Most well-cooked and canned vegetables without skins or seeds. Tender lettuce. Fruits Apple sauce. Fruits canned in juice. Cooked apricots, cherries, grapefruit, peaches, pears, or plums. Fresh bananas and cantaloupe. Meats and other protein foods Baked or boiled chicken. Eggs. Tofu. Fish. Seafood. Smooth nut butters. Ground or well-cooked tender beef, ham, veal, lamb, pork, or poultry. Dairy Plain yogurt, kefir, and unsweetened liquid yogurt. Lactose-free milk, buttermilk, skim milk, or soy milk. Low-fat or nonfat hard cheese. Beverages Water. Low-calorie sports drinks. Fruit juices without pulp. Strained tomato and vegetable juices. Decaffeinated teas. Sugar-free beverages not sweetened with sugar alcohols. Oral rehydration solutions, if approved by your  health care provider. Seasoning and other foods Bouillon, broth, or soups made from recommended foods. What foods are not recommended? The items listed may not be a complete list. Talk  with your health care provider about what dietary choices are best for you. Grains Whole grain, whole wheat, bran, or rye breads, rolls, pastas, and crackers. Wild or brown rice. Whole grain or bran cereals. Barley. Oats and oatmeal. Corn tortillas or taco shells. Granola. Popcorn. Vegetables Raw vegetables. Fried vegetables. Cabbage, broccoli, Brussels sprouts, artichokes, baked beans, beet greens, corn, kale, legumes, peas, sweet potatoes, and yams. Potato skins. Cooked spinach and cabbage. Fruits Dried fruit, including raisins and dates. Raw fruits. Stewed or dried prunes. Canned fruits with syrup. Meat and other protein foods Fried or fatty meats. Deli meats. Chunky nut butters. Nuts and seeds. Beans and lentils. Berniece Salines. Hot dogs. Sausage. Dairy High-fat cheeses. Whole milk, chocolate milk, and beverages made with milk, such as milk shakes. Half-and-half. Cream. sour cream. Ice cream. Beverages Caffeinated beverages (such as coffee, tea, soda, or energy drinks). Alcoholic beverages. Fruit juices with pulp. Prune juice. Soft drinks sweetened with high-fructose corn syrup or sugar alcohols. High-calorie sports drinks. Fats and oils Butter. Cream sauces. Margarine. Salad oils. Plain salad dressings. Olives. Avocados. Mayonnaise. Sweets and desserts Sweet rolls, doughnuts, and sweet breads. Sugar-free desserts sweetened with sugar alcohols such as xylitol and sorbitol. Seasoning and other foods Honey. Hot sauce. Chili powder. Gravy. Cream-based or milk-based soups. Pancakes and waffles. Summary  When you have diarrhea, the foods you eat and your eating habits are very important.  Make sure you get at least 8-10 cups of fluid each day, or enough to keep your urine clear or pale yellow.  Eat bland foods and gradually reintroduce healthy, nutrient-rich foods as tolerated, or as told by your health care provider.  Avoid high-fiber, fried, greasy, or spicy foods. This information is not  intended to replace advice given to you by your health care provider. Make sure you discuss any questions you have with your health care provider. Document Released: 09/13/2003 Document Revised: 06/20/2016 Document Reviewed: 06/20/2016 Elsevier Interactive Patient Education  2017 [employer].

## 2017-01-20 NOTE — Addendum Note (Signed)
Addended by: Tomi Likens on: 01/20/2017 10:30 AM   Modules accepted: Orders

## 2017-01-21 ENCOUNTER — Telehealth: Payer: Self-pay | Admitting: Adult Health

## 2017-01-21 NOTE — Telephone Encounter (Signed)
Jennifer Deleon pt is returning your call.

## 2017-01-22 DIAGNOSIS — J3089 Other allergic rhinitis: Secondary | ICD-10-CM | POA: Diagnosis not present

## 2017-01-22 DIAGNOSIS — J301 Allergic rhinitis due to pollen: Secondary | ICD-10-CM | POA: Diagnosis not present

## 2017-01-22 DIAGNOSIS — J3081 Allergic rhinitis due to animal (cat) (dog) hair and dander: Secondary | ICD-10-CM | POA: Diagnosis not present

## 2017-01-26 LAB — GASTROINTESTINAL PATHOGEN PANEL PCR
C. difficile Tox A/B, PCR: NOT DETECTED
CAMPYLOBACTER, PCR: NOT DETECTED
CRYPTOSPORIDIUM, PCR: NOT DETECTED
E COLI (ETEC) LT/ST, PCR: NOT DETECTED
E coli (STEC) stx1/stx2, PCR: NOT DETECTED
E coli 0157, PCR: NOT DETECTED
GIARDIA LAMBLIA, PCR: NOT DETECTED
NOROVIRUS, PCR: NOT DETECTED
ROTAVIRUS, PCR: NOT DETECTED
SHIGELLA, PCR: NOT DETECTED
Salmonella, PCR: NOT DETECTED

## 2017-01-27 NOTE — Addendum Note (Signed)
Addended by: Abelardo Diesel on: 01/27/2017 09:42 AM   Modules accepted: Orders

## 2017-01-27 NOTE — Telephone Encounter (Signed)
Already spoke with pt

## 2017-01-28 ENCOUNTER — Encounter: Payer: Self-pay | Admitting: *Deleted

## 2017-01-28 DIAGNOSIS — J301 Allergic rhinitis due to pollen: Secondary | ICD-10-CM | POA: Diagnosis not present

## 2017-01-28 DIAGNOSIS — J3081 Allergic rhinitis due to animal (cat) (dog) hair and dander: Secondary | ICD-10-CM | POA: Diagnosis not present

## 2017-01-28 DIAGNOSIS — J3089 Other allergic rhinitis: Secondary | ICD-10-CM | POA: Diagnosis not present

## 2017-01-29 ENCOUNTER — Encounter: Payer: Self-pay | Admitting: Internal Medicine

## 2017-01-29 ENCOUNTER — Ambulatory Visit (INDEPENDENT_AMBULATORY_CARE_PROVIDER_SITE_OTHER): Payer: Medicare Other | Admitting: Internal Medicine

## 2017-01-29 VITALS — BP 84/60 | HR 68 | Ht 63.0 in | Wt 113.4 lb

## 2017-01-29 DIAGNOSIS — R110 Nausea: Secondary | ICD-10-CM

## 2017-01-29 DIAGNOSIS — R195 Other fecal abnormalities: Secondary | ICD-10-CM | POA: Diagnosis not present

## 2017-01-29 DIAGNOSIS — R109 Unspecified abdominal pain: Secondary | ICD-10-CM | POA: Diagnosis not present

## 2017-01-29 DIAGNOSIS — R141 Gas pain: Secondary | ICD-10-CM

## 2017-01-29 DIAGNOSIS — R140 Abdominal distension (gaseous): Secondary | ICD-10-CM | POA: Diagnosis not present

## 2017-01-29 MED ORDER — DICYCLOMINE HCL 10 MG PO CAPS: ORAL_CAPSULE | ORAL | 1 refills | Status: DC

## 2017-01-29 MED ORDER — ONDANSETRON 4 MG PO TBDP: 4.0000 mg | ORAL_TABLET | Freq: Three times a day (TID) | ORAL | 0 refills | Status: DC | PRN

## 2017-01-29 MED ORDER — ONDANSETRON HCL 4 MG PO TABS: 4.0000 mg | ORAL_TABLET | Freq: Three times a day (TID) | ORAL | 1 refills | Status: DC | PRN

## 2017-01-29 MED ORDER — DICYCLOMINE HCL 10 MG PO CAPS: 10.0000 mg | ORAL_CAPSULE | Freq: Three times a day (TID) | ORAL | 1 refills | Status: DC

## 2017-01-29 NOTE — Progress Notes (Signed)
Patient ID: Jennifer Deleon, female   DOB: 12-16-1945, 71 y.o.   MRN: [id number] HPI: Jennifer Deleon is a 71 year old female with past medical history of hypothyroidism, osteoporosis who is seen in consult at request of Sallee Provencal, NP to evaluate abdominal pain associated with gas, bloating and loose stools. She is here alone today. She reports about 3, nearly 4 weeks ago, she developed explosive diarrhea associated with lower abdominal pain, gas and bloating. This occurred on a morning after eating crab legs the night before. She attributed her symptoms initially to food poisoning. The diarrhea lasted for the better part of the week and has improved her bowel habits have not yet returned to normal. She has continued to have soft and at times loose stools. Normal bowel movements for her are a formed bowel movement every 2-3 days. She is currently having a soft bowel movement every 1-2 days. She still reports abdominal bloating and mid and lower abdominal pain and soreness. She reports feeling quite "gassy". She's had nausea without vomiting. She has been using Pepto-Bismol as well as Tums for the nausea and loose stools. Since doing so her stools have been dark. She also started a probiotic. She was taken Imodium but has not needed it recently. Lomotil was prescribed but never filled the pharmacy. She has had some increasing fatigue since symptoms started. She denies heartburn, dysphagia and odynophagia. She denies rectal bleeding and melena.  She had a colonoscopy roughly 2 years ago at [place]. She recalls having 2 polyps removed but was told to repeat the exam in 10 years.  Primary care perform labs which revealed a normal white count with no evidence of anemia. GI pathogen panel was negative.  Past Medical History:  Diagnosis Date  . Arthritis   . Asthma   . Chicken pox   . Colon polyps   . Depression   . Environmental allergies    allergies all year long  . Hypothyroid   . Osteoporosis   .  UTI (lower urinary tract infection)     Past Surgical History:  Procedure Laterality Date  . CATARACT EXTRACTION Bilateral 2016  . LAMINECTOMY    . LUMBAR FUSION  2015   l4-L5  . TUBAL LIGATION      Outpatient Medications Prior to Visit  Medication Sig Dispense Refill  . albuterol (PROVENTIL HFA;VENTOLIN HFA) 108 (90 BASE) MCG/ACT inhaler Inhale 2 puffs into the lungs every 6 (six) hours as needed for wheezing or shortness of breath.    Marland Kitchen azelastine (ASTELIN) 0.1 % nasal spray Place 1 spray into both nostrils 2 (two) times daily. Use in each nostril as directed    . beclomethasone (QVAR) 40 MCG/ACT inhaler Inhale 2 puffs into the lungs 2 (two) times daily.    Marland Kitchen buPROPion (WELLBUTRIN XL) 300 MG 24 hr tablet Take 300 mg by mouth every morning.    . calcium-vitamin D (OSCAL-500) 500-400 MG-UNIT per tablet Take 1 tablet by mouth 2 (two) times daily with a meal.    . cetirizine (ZYRTEC) 10 MG tablet Take 10 mg by mouth every morning.    . diphenhydrAMINE (BENADRYL) 25 MG tablet Take 25 mg by mouth every 6 (six) hours as needed for allergies.    . diphenoxylate-atropine (LOMOTIL) 2.5-0.025 MG tablet Take 1 tablet by mouth 4 (four) times daily as needed for diarrhea or loose stools. 30 tablet 1  . docusate sodium (COLACE) 100 MG capsule Take 100 mg by mouth at bedtime.    Marland Kitchen  DULoxetine (CYMBALTA) 60 MG capsule     . fexofenadine (ALLER-EASE) 180 MG tablet Take 180 mg by mouth every morning.    . gabapentin (NEURONTIN) 300 MG capsule Take 300 mg by mouth at bedtime.    Marland Kitchen ibuprofen (ADVIL,MOTRIN) 200 MG tablet Take 200-800 mg by mouth every 6 (six) hours as needed for pain.    Marland Kitchen levothyroxine (SYNTHROID, LEVOTHROID) 125 MCG tablet Take 125 mcg by mouth daily before breakfast.    . montelukast (SINGULAIR) 10 MG tablet Take 10 mg by mouth at bedtime.    . Multiple Vitamin (MULTIVITAMIN WITH MINERALS) TABS Take 1 tablet by mouth every morning.     No facility-administered medications prior to  visit.     Allergies  Allergen Reactions  . Augmentin [Amoxicillin-Pot Clavulanate] Diarrhea and Other (See Comments)    Severe Stomach cramps  . Bactrim [Sulfamethoxazole-Trimethoprim] Swelling    Facial and lip swelling    Family History  Problem Relation Age of Onset  . Osteoarthritis Mother   . Osteoarthritis Maternal Grandmother   . Osteoarthritis Father   . Hypertension Father   . Heart failure Father   . Prostate cancer Paternal Grandfather        mets to colon  . Arthritis Unknown   . Prostate cancer Unknown   . Mental illness Unknown     Social History  Substance Use Topics  . Smoking status: Never Smoker  . Smokeless tobacco: Never Used  . Alcohol use 4.2 oz/week    7 Glasses of wine per week     Comment: a glass of wine with dinner each night    ROS: As per history of present illness, otherwise negative  BP (!) 84/60   Pulse 68   Ht 5\' 3"  (1.6 m)   Wt 113 lb 6 oz (51.4 kg)   BMI 20.08 kg/m  Constitutional: Well-developed and well-nourished. No distress. HEENT: Normocephalic and atraumatic. Oropharynx is clear and moist. Conjunctivae are normal.  No scleral icterus. Neck: Neck supple. Trachea midline. Cardiovascular: Normal rate, regular rhythm and intact distal pulses. No M/R/G Pulmonary/chest: Effort normal and breath sounds normal. No wheezing, rales or rhonchi. Abdominal: Soft, Diffusely tender in the mid lower abdomen without definitive rebound or guarding, nondistended. Bowel sounds active throughout. There are no masses palpable. No hepatosplenomegaly. Extremities: no clubbing, cyanosis, or edema Neurological: Alert and oriented to person place and time. Skin: Skin is warm and dry.  Psychiatric: Normal mood and affect. Behavior is normal.  RELEVANT LABS AND IMAGING: CBC    Component Value Date/Time   WBC 6.7 01/20/2017 1030   RBC 4.08 01/20/2017 1030   HGB 12.8 01/20/2017 1030   HCT 38.4 01/20/2017 1030   PLT 322.0 01/20/2017 1030   MCV  94.1 01/20/2017 1030   MCH 31.2 08/12/2015 0532   MCHC 33.3 01/20/2017 1030   RDW 13.9 01/20/2017 1030   LYMPHSABS 1.7 01/20/2017 1030   MONOABS 0.8 01/20/2017 1030   EOSABS 0.2 01/20/2017 1030   BASOSABS 0.0 01/20/2017 1030    CMP     Component Value Date/Time   NA 139 01/20/2017 1030   K 4.2 01/20/2017 1030   CL 104 01/20/2017 1030   CO2 29 01/20/2017 1030   GLUCOSE 83 01/20/2017 1030   BUN 12 01/20/2017 1030   CREATININE 0.76 01/20/2017 1030   CALCIUM 9.6 01/20/2017 1030   PROT 5.9 (L) 08/12/2015 0532   ALBUMIN 3.1 (L) 08/12/2015 0532   AST 22 08/12/2015 0532   ALT 23  08/12/2015 0532   ALKPHOS 67 08/12/2015 0532   BILITOT 0.4 08/12/2015 0532   GFRNONAA >60 08/12/2015 0532   GFRAA >60 08/12/2015 0532   GI pathogen - neg  ASSESSMENT/PLAN: 71 year old female with past medical history of hypothyroidism, osteoporosis who is seen in consult at request of Sallee Provencal, NP to evaluate abdominal pain associated with gas, bloating and loose stools.  1. Subacute loose stools with ongoing mid and lower abdominal pain/nausea/abdominal bloating with flatulence symptoms/fatigue -- symptoms do sound infectious however have been present now for at least 3 weeks. I recommended CT scan of the abdomen and pelvis with IV contrast to exclude enteritis and colitis. I will have her stop Pepto-Bismol. 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 We'll give her Bentyl 10 mg 1-2 tablets every 8 hours as needed for crampy abdominal pain and loose stool. Zofran 4 mg every 8 hours when necessary nausea. 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     XL:[id number], [place], [place] [place], [place] [address]

## 2017-01-29 NOTE — Patient Instructions (Addendum)
You have been scheduled for a CT scan of the abdomen and pelvis at [facility] ([address]---this is in the same building as [facility]).   You are scheduled on 02-03-17 at 1:00pm. You should arrive 15 minutes prior to your appointment time for registration. Please follow the written instructions below on the day of your exam:  WARNING: IF YOU ARE ALLERGIC TO IODINE/X-RAY DYE, PLEASE NOTIFY RADIOLOGY IMMEDIATELY AT [phone]! YOU WILL BE GIVEN A 13 HOUR PREMEDICATION PREP.  1) Do not eat or drink anything after 9:00am (4 hours prior to your test) 2) You have been given 2 bottles of oral contrast to drink. The solution may taste better if refrigerated, but do NOT add ice or any other liquid to this solution. Shake well before drinking.    Drink 1 bottle of contrast @ 11:00am (2 hours prior to your exam)  Drink 1 bottle of contrast @ 12:00pm (1 hour prior to your exam)  You may take any medications as prescribed with a small amount of water except for the following: Metformin, Glucophage, Glucovance, Avandamet, Riomet, Fortamet, Actoplus Met, Janumet, Glumetza or Metaglip. The above medications must be held the day of the exam AND 48 hours after the exam.  The purpose of you drinking the oral contrast is to aid in the visualization of your intestinal tract. The contrast solution may cause some diarrhea. Before your exam is started, you will be given a small amount of fluid to drink. Depending on your individual set of symptoms, you may also receive an intravenous injection of x-ray contrast/dye. Plan on being at [facility] for 30 minutes or longer, depending on the type of exam you are having performed.  This test typically takes 30-45 minutes to complete.  If you have any questions regarding your exam or if you need to reschedule, you may call the CT department at [phone] between the hours of 8:00 am and 5:00 pm,  Monday-Friday.  ________________________________________________________________________  Discontinue using Pepto.  We have sent the following medications to your pharmacy for you to pick up at your convenience: Zofran Bentyl  Normal BMI (Body Mass Index- based on height and weight) is between 23 and 30. Your BMI today is Body mass index is 20.08 kg/m. Marland Kitchen Please consider follow up  regarding your BMI with your Primary Care Provider.

## 2017-02-02 ENCOUNTER — Other Ambulatory Visit: Payer: Self-pay

## 2017-02-03 ENCOUNTER — Ambulatory Visit (INDEPENDENT_AMBULATORY_CARE_PROVIDER_SITE_OTHER)
Admission: RE | Admit: 2017-02-03 | Discharge: 2017-02-03 | Disposition: A | Discharge status: Home / Self Care | Payer: Medicare Other | Source: Ambulatory Visit | Attending: Internal Medicine | Admitting: Internal Medicine

## 2017-02-03 DIAGNOSIS — R109 Unspecified abdominal pain: Secondary | ICD-10-CM

## 2017-02-03 DIAGNOSIS — R141 Gas pain: Secondary | ICD-10-CM

## 2017-02-03 DIAGNOSIS — R140 Abdominal distension (gaseous): Secondary | ICD-10-CM

## 2017-02-03 DIAGNOSIS — R197 Diarrhea, unspecified: Secondary | ICD-10-CM | POA: Diagnosis not present

## 2017-02-03 MED ORDER — IOPAMIDOL (ISOVUE-300) INJECTION 61%
100.0000 mL | Freq: Once | INTRAVENOUS | Status: AC | PRN
  Administered 2017-02-03: 100 mL via INTRAVENOUS

## 2017-02-05 DIAGNOSIS — J301 Allergic rhinitis due to pollen: Secondary | ICD-10-CM | POA: Diagnosis not present

## 2017-02-05 DIAGNOSIS — J3081 Allergic rhinitis due to animal (cat) (dog) hair and dander: Secondary | ICD-10-CM | POA: Diagnosis not present

## 2017-02-05 DIAGNOSIS — J3089 Other allergic rhinitis: Secondary | ICD-10-CM | POA: Diagnosis not present

## 2017-02-06 ENCOUNTER — Telehealth: Payer: Self-pay | Admitting: Internal Medicine

## 2017-02-06 NOTE — Telephone Encounter (Signed)
[facility] pharmacy calling to state that medication xifaxan has been approved but is too expensive for patient. Pharmacy is going to speak with patient about options and go from there.

## 2017-02-10 DIAGNOSIS — J3089 Other allergic rhinitis: Secondary | ICD-10-CM | POA: Diagnosis not present

## 2017-02-10 DIAGNOSIS — J301 Allergic rhinitis due to pollen: Secondary | ICD-10-CM | POA: Diagnosis not present

## 2017-02-10 DIAGNOSIS — J3081 Allergic rhinitis due to animal (cat) (dog) hair and dander: Secondary | ICD-10-CM | POA: Diagnosis not present

## 2017-02-13 DIAGNOSIS — J3089 Other allergic rhinitis: Secondary | ICD-10-CM | POA: Diagnosis not present

## 2017-02-13 DIAGNOSIS — J3081 Allergic rhinitis due to animal (cat) (dog) hair and dander: Secondary | ICD-10-CM | POA: Diagnosis not present

## 2017-02-13 DIAGNOSIS — J301 Allergic rhinitis due to pollen: Secondary | ICD-10-CM | POA: Diagnosis not present

## 2017-02-19 ENCOUNTER — Telehealth: Payer: Self-pay | Admitting: *Deleted

## 2017-02-19 DIAGNOSIS — J3081 Allergic rhinitis due to animal (cat) (dog) hair and dander: Secondary | ICD-10-CM | POA: Diagnosis not present

## 2017-02-19 DIAGNOSIS — J301 Allergic rhinitis due to pollen: Secondary | ICD-10-CM | POA: Diagnosis not present

## 2017-02-19 DIAGNOSIS — J3089 Other allergic rhinitis: Secondary | ICD-10-CM | POA: Diagnosis not present

## 2017-02-19 NOTE — Telephone Encounter (Signed)
I have left a voicemail for patient to call back. Dr Hilarie Fredrickson has received records from Dr Amedeo Plenty, [facility]. Per Dr Hilarie Fredrickson, " Colon reviewed. Repeat colon recommended 02/2022 for screening. I recommended rifaximin, but too expensive for IBS-D. Please see if she is better."

## 2017-02-20 NOTE — Telephone Encounter (Signed)
Patient has been advised of Dr Vena Rua recommendations. She states that she did get the Xifaxan and is doing somewhat better but has a "bad day about every 3rd day." She has one more full week to go on the Xifaxan. I have asked that she contact us if she continues to have diarrhea after the complete xifaxan course. She verbalizes understanding.

## 2017-02-24 DIAGNOSIS — J3089 Other allergic rhinitis: Secondary | ICD-10-CM | POA: Diagnosis not present

## 2017-02-24 DIAGNOSIS — J3081 Allergic rhinitis due to animal (cat) (dog) hair and dander: Secondary | ICD-10-CM | POA: Diagnosis not present

## 2017-02-24 DIAGNOSIS — J301 Allergic rhinitis due to pollen: Secondary | ICD-10-CM | POA: Diagnosis not present

## 2017-02-26 ENCOUNTER — Ambulatory Visit (INDEPENDENT_AMBULATORY_CARE_PROVIDER_SITE_OTHER): Payer: Medicare Other | Admitting: Adult Health

## 2017-02-26 ENCOUNTER — Other Ambulatory Visit: Payer: Self-pay | Admitting: Adult Health

## 2017-02-26 VITALS — BP 104/62 | Temp 98.4°F | Wt 114.0 lb

## 2017-02-26 DIAGNOSIS — E039 Hypothyroidism, unspecified: Secondary | ICD-10-CM | POA: Diagnosis not present

## 2017-02-26 DIAGNOSIS — W540XXA Bitten by dog, initial encounter: Secondary | ICD-10-CM

## 2017-02-26 DIAGNOSIS — J301 Allergic rhinitis due to pollen: Secondary | ICD-10-CM | POA: Diagnosis not present

## 2017-02-26 DIAGNOSIS — J3081 Allergic rhinitis due to animal (cat) (dog) hair and dander: Secondary | ICD-10-CM | POA: Diagnosis not present

## 2017-02-26 LAB — T3, FREE: T3 FREE: 2.7 pg/mL (ref 2.3–4.2)

## 2017-02-26 LAB — T4, FREE: Free T4: 0.94 ng/dL (ref 0.60–1.60)

## 2017-02-26 LAB — TSH: TSH: 0.22 u[IU]/mL — AB (ref 0.35–4.50)

## 2017-02-26 MED ORDER — LEVOTHYROXINE SODIUM 125 MCG PO TABS: 125.0000 ug | ORAL_TABLET | Freq: Every day | ORAL | 3 refills | Status: DC

## 2017-02-26 MED ORDER — DOXYCYCLINE HYCLATE 100 MG PO CAPS: 100.0000 mg | ORAL_CAPSULE | Freq: Two times a day (BID) | ORAL | 0 refills | Status: DC

## 2017-02-26 NOTE — Progress Notes (Signed)
Subjective:    Patient ID: Jennifer Deleon, female    DOB: 1946-01-04, 71 y.o.   MRN: [id number]  HPI  72 year old female who  has a past medical history of Arthritis; Asthma; Chicken pox; Colon polyps; Depression; Environmental allergies; Hypothyroid; Osteoporosis; and UTI (lower urinary tract infection). she presents to the office today for refill of synthroid. She is currently maintained on 125 mcg. She has not had her levels checked at this office. She has been out of her medication for the last couple of days   She was also bit by her pet dog on the right index finger. This happened greater than 24 hours ago. She reports that her dog is up to date on her shots.   No active bleeding noted.   She is UTD on Tdap   Review of Systems See HPI   Past Medical History:  Diagnosis Date  . Arthritis   . Asthma   . Chicken pox   . Colon polyps   . Depression   . Environmental allergies    allergies all year long  . Hypothyroid   . Osteoporosis   . UTI (lower urinary tract infection)     Social History   Social History  . Marital status: Married    Spouse name: N/A  . Number of children: N/A  . Years of education: N/A   Occupational History  . Not on file.   Social History Main Topics  . Smoking status: Never Smoker  . Smokeless tobacco: Never Used  . Alcohol use 4.2 oz/week    7 Glasses of wine per week     Comment: a glass of wine with dinner each night  . Drug use: No  . Sexual activity: Not on file   Other Topics Concern  . Not on file   Social History Narrative   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    Married for  11 years    [place] who live in [place]          Past Surgical History:  Procedure Laterality Date  . CATARACT EXTRACTION Bilateral 2016  . LAMINECTOMY    . LUMBAR FUSION  2015   l4-L5  . TUBAL LIGATION      Family History  Problem Relation Age of Onset  . Osteoarthritis Mother   . Osteoarthritis Maternal Grandmother   . Osteoarthritis  Father   . Hypertension Father   . Heart failure Father   . Prostate cancer Paternal Grandfather        mets to colon  . Arthritis Unknown   . Prostate cancer Unknown   . Mental illness Unknown     Allergies  Allergen Reactions  . Augmentin [Amoxicillin-Pot Clavulanate] Diarrhea and Other (See Comments)    Severe Stomach cramps  . Bactrim [Sulfamethoxazole-Trimethoprim] Swelling    Facial and lip swelling    Current Outpatient Prescriptions on File Prior to Visit  Medication Sig Dispense Refill  . azelastine (ASTELIN) 0.1 % nasal spray Place 1 spray into both nostrils 2 (two) times daily. Use in each nostril as directed    . beclomethasone (QVAR) 40 MCG/ACT inhaler Inhale 2 puffs into the lungs 2 (two) times daily.    Marland Kitchen buPROPion (WELLBUTRIN XL) 300 MG 24 hr tablet Take 300 mg by mouth every morning.    . calcium carbonate (TUMS - DOSED IN MG ELEMENTAL CALCIUM) 500 MG chewable tablet Chew 1 tablet by mouth daily.    . calcium-vitamin D (OSCAL-500) 500-400 MG-UNIT  per tablet Take 1 tablet by mouth 2 (two) times daily with a meal.    . cetirizine (ZYRTEC) 10 MG tablet Take 10 mg by mouth every morning.    . dicyclomine (BENTYL) 10 MG capsule Take 1 to 2 tablets every 8 hours as needed for cramping and abdominal pain. 180 capsule 1  . diphenhydrAMINE (BENADRYL) 25 MG tablet Take 25 mg by mouth every 6 (six) hours as needed for allergies.    Marland Kitchen docusate sodium (COLACE) 100 MG capsule Take 100 mg by mouth at bedtime.    . DULoxetine (CYMBALTA) 60 MG capsule     . fexofenadine (ALLER-EASE) 180 MG tablet Take 180 mg by mouth every morning.    . gabapentin (NEURONTIN) 300 MG capsule Take 300 mg by mouth at bedtime.    Marland Kitchen ibuprofen (ADVIL,MOTRIN) 200 MG tablet Take 200-800 mg by mouth every 6 (six) hours as needed for pain.    Marland Kitchen levothyroxine (SYNTHROID, LEVOTHROID) 125 MCG tablet Take 125 mcg by mouth daily before breakfast.    . montelukast (SINGULAIR) 10 MG tablet Take 10 mg by mouth at  bedtime.    . Multiple Vitamin (MULTIVITAMIN WITH MINERALS) TABS Take 1 tablet by mouth every morning.    . ondansetron (ZOFRAN ODT) 4 MG disintegrating tablet Take 1 tablet (4 mg total) by mouth every 8 (eight) hours as needed for nausea or vomiting. 40 tablet 0  . ondansetron (ZOFRAN) 4 MG tablet Take 1 tablet (4 mg total) by mouth every 8 (eight) hours as needed for nausea or vomiting. 40 tablet 1  . Probiotic Product (PROBIOTIC-10 PO) Take by mouth.     No current facility-administered medications on file prior to visit.     BP 104/62 (BP Location: Left Arm)   Temp 98.4 F (36.9 C) (Oral)   Wt 114 lb (51.7 kg)   BMI 20.19 kg/m       Objective:   Physical Exam  Constitutional: She is oriented to person, place, and time. She appears well-developed and well-nourished. No distress.  Cardiovascular: Normal rate, regular rhythm, normal heart sounds and intact distal pulses.  Exam reveals no gallop and no friction rub.   No murmur heard. Pulmonary/Chest: Effort normal and breath sounds normal. No respiratory distress. She has no wheezes. She has no rales. She exhibits no tenderness.  Neurological: She is alert and oriented to person, place, and time.  Skin: Skin is warm and dry. No rash noted. She is not diaphoretic. No erythema. No pallor.  Small superficial laceration to dorsal aspect on distal 1/3 of right index finger. Laceration to anterior aspect of distal 1/3 right index finger. Neither require sutures.   Psychiatric: She has a normal mood and affect. Her behavior is normal. Judgment and thought content normal.  Vitals reviewed.     Assessment & Plan:  1. Hypothyroidism, unspecified type - TSH - T3, Free - T4, Free - Consider increasing synthroid  2. Dog bite, initial encounter -  Keep wound clean and dry. Can apply triple antibiotic ointment. Will give her a script for doxycycline she can take if signs of infection present  - doxycycline (VIBRAMYCIN) 100 MG capsule; Take 1  capsule (100 mg total) by mouth 2 (two) times daily.  Dispense: 14 capsule; Refill: 0 - Follow up as needed  [name], NP

## 2017-03-04 DIAGNOSIS — J3089 Other allergic rhinitis: Secondary | ICD-10-CM | POA: Diagnosis not present

## 2017-03-04 DIAGNOSIS — J3081 Allergic rhinitis due to animal (cat) (dog) hair and dander: Secondary | ICD-10-CM | POA: Diagnosis not present

## 2017-03-04 DIAGNOSIS — J301 Allergic rhinitis due to pollen: Secondary | ICD-10-CM | POA: Diagnosis not present

## 2017-03-12 DIAGNOSIS — J301 Allergic rhinitis due to pollen: Secondary | ICD-10-CM | POA: Diagnosis not present

## 2017-03-12 DIAGNOSIS — J3089 Other allergic rhinitis: Secondary | ICD-10-CM | POA: Diagnosis not present

## 2017-03-12 DIAGNOSIS — J3081 Allergic rhinitis due to animal (cat) (dog) hair and dander: Secondary | ICD-10-CM | POA: Diagnosis not present

## 2017-03-17 DIAGNOSIS — J301 Allergic rhinitis due to pollen: Secondary | ICD-10-CM | POA: Diagnosis not present

## 2017-03-17 DIAGNOSIS — J3089 Other allergic rhinitis: Secondary | ICD-10-CM | POA: Diagnosis not present

## 2017-03-17 DIAGNOSIS — J3081 Allergic rhinitis due to animal (cat) (dog) hair and dander: Secondary | ICD-10-CM | POA: Diagnosis not present

## 2017-03-27 ENCOUNTER — Encounter: Payer: Self-pay | Admitting: Adult Health

## 2017-03-27 DIAGNOSIS — J3089 Other allergic rhinitis: Secondary | ICD-10-CM | POA: Diagnosis not present

## 2017-03-27 DIAGNOSIS — J301 Allergic rhinitis due to pollen: Secondary | ICD-10-CM | POA: Diagnosis not present

## 2017-03-27 DIAGNOSIS — J3081 Allergic rhinitis due to animal (cat) (dog) hair and dander: Secondary | ICD-10-CM | POA: Diagnosis not present

## 2017-04-03 DIAGNOSIS — J301 Allergic rhinitis due to pollen: Secondary | ICD-10-CM | POA: Diagnosis not present

## 2017-04-03 DIAGNOSIS — J3081 Allergic rhinitis due to animal (cat) (dog) hair and dander: Secondary | ICD-10-CM | POA: Diagnosis not present

## 2017-04-03 DIAGNOSIS — J3089 Other allergic rhinitis: Secondary | ICD-10-CM | POA: Diagnosis not present

## 2017-04-09 ENCOUNTER — Other Ambulatory Visit: Payer: Self-pay | Admitting: Family Medicine

## 2017-04-09 DIAGNOSIS — J301 Allergic rhinitis due to pollen: Secondary | ICD-10-CM | POA: Diagnosis not present

## 2017-04-09 DIAGNOSIS — J3089 Other allergic rhinitis: Secondary | ICD-10-CM | POA: Diagnosis not present

## 2017-04-09 DIAGNOSIS — J3081 Allergic rhinitis due to animal (cat) (dog) hair and dander: Secondary | ICD-10-CM | POA: Diagnosis not present

## 2017-04-09 MED ORDER — LEVOTHYROXINE SODIUM 125 MCG PO TABS: 125.0000 ug | ORAL_TABLET | Freq: Every day | ORAL | 2 refills | Status: DC

## 2017-04-09 NOTE — Telephone Encounter (Signed)
Received a fax from [facility] requesting refills of Levothyroxine.  Was sent to [facility] for 1 year on 02/26/17.  Last thryoid lab 02/26/17.  Sent by [facility] for 9 months.

## 2017-04-17 DIAGNOSIS — J3089 Other allergic rhinitis: Secondary | ICD-10-CM | POA: Diagnosis not present

## 2017-04-17 DIAGNOSIS — J3081 Allergic rhinitis due to animal (cat) (dog) hair and dander: Secondary | ICD-10-CM | POA: Diagnosis not present

## 2017-04-17 DIAGNOSIS — J301 Allergic rhinitis due to pollen: Secondary | ICD-10-CM | POA: Diagnosis not present

## 2017-04-21 ENCOUNTER — Encounter: Payer: Self-pay | Admitting: Adult Health

## 2017-04-21 ENCOUNTER — Ambulatory Visit (INDEPENDENT_AMBULATORY_CARE_PROVIDER_SITE_OTHER): Payer: Medicare Other | Admitting: Adult Health

## 2017-04-21 VITALS — BP 98/70 | Temp 98.6°F | Ht 63.0 in | Wt 115.0 lb

## 2017-04-21 DIAGNOSIS — M65311 Trigger thumb, right thumb: Secondary | ICD-10-CM | POA: Diagnosis not present

## 2017-04-21 DIAGNOSIS — Z23 Encounter for immunization: Secondary | ICD-10-CM | POA: Diagnosis not present

## 2017-04-21 MED ORDER — METHYLPREDNISOLONE 4 MG PO TBPK: ORAL_TABLET | ORAL | 0 refills | Status: DC

## 2017-04-21 MED ORDER — IBUPROFEN 600 MG PO TABS: 600.0000 mg | ORAL_TABLET | Freq: Three times a day (TID) | ORAL | 0 refills | Status: AC

## 2017-04-21 NOTE — Progress Notes (Signed)
Subjective:    Patient ID: Jennifer Deleon, female    DOB: [birth year], 71 y.o.   MRN: [id number]  HPI  71 year old female who  has a past medical history of Arthritis; Asthma; Chicken pox; Colon polyps; Depression; Environmental allergies; Hypothyroid; Osteoporosis; and UTI (lower urinary tract infection). She presents to the office today for the acute complaint of right thumb pain x1 month with loss of ROM. She feels as though it may be trigger thumb. She reports that her thumb gets stuck in a bent position and then will "snap" back. She endorses pain at the base of the thumb.   Review of Systems See HPI  Past Medical History:  Diagnosis Date  . Arthritis   . Asthma   . Chicken pox   . Colon polyps   . Depression   . Environmental allergies    allergies all year long  . Hypothyroid   . Osteoporosis   . UTI (lower urinary tract infection)     Social History   Social History  . Marital status: Married    Spouse name: N/A  . Number of children: N/A  . Years of education: N/A   Occupational History  . Not on file.   Social History Main Topics  . Smoking status: Never Smoker  . Smokeless tobacco: Never Used  . Alcohol use 4.2 oz/week    7 Glasses of wine per week     Comment: a glass of wine with dinner each night  . Drug use: No  . Sexual activity: Not on file   Other Topics Concern  . Not on file   Social History Narrative   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ighter    Married for  11 years    [place] who live in [place]          Past Surgical History:  Procedure Laterality Date  . CATARACT EXTRACTION Bilateral 2016  . LAMINECTOMY    . LUMBAR FUSION  2015   l4-L5  . TUBAL LIGATION      Family History  Problem Relation Age of Onset  . Osteoarthritis Mother   . Osteoarthritis Maternal Grandmother   . Osteoarthritis Father   . Hypertension Father   . Heart failure Father   . Prostate cancer Paternal Grandfather        mets to colon  . Arthritis Unknown   .  Prostate cancer Unknown   . Mental illness Unknown     Allergies  Allergen Reactions  . Augmentin [Amoxicillin-Pot Clavulanate] Diarrhea and Other (See Comments)    Severe Stomach cramps  . Bactrim [Sulfamethoxazole-Trimethoprim] Swelling    Facial and lip swelling    Current Outpatient Prescriptions on File Prior to Visit  Medication Sig Dispense Refill  . azelastine (ASTELIN) 0.1 % nasal spray Place 1 spray into both nostrils 2 (two) times daily. Use in each nostril as directed    . beclomethasone (QVAR) 40 MCG/ACT inhaler Inhale 2 puffs into the lungs 2 (two) times daily.    Marland Kitchen buPROPion (WELLBUTRIN XL) 300 MG 24 hr tablet Take 300 mg by mouth every morning.    . calcium-vitamin D (OSCAL-500) 500-400 MG-UNIT per tablet Take 1 tablet by mouth 2 (two) times daily with a meal.    . cetirizine (ZYRTEC) 10 MG tablet Take 10 mg by mouth every morning.    . diphenhydrAMINE (BENADRYL) 25 MG tablet Take 25 mg by mouth every 6 (six) hours as needed for allergies.    Marland Kitchen docusate  sodium (COLACE) 100 MG capsule Take 100 mg by mouth at bedtime.    . DULoxetine (CYMBALTA) 60 MG capsule     . fexofenadine (ALLER-EASE) 180 MG tablet Take 180 mg by mouth every morning.    . fluticasone (FLONASE) 50 MCG/ACT nasal spray Place 1 spray into both nostrils 2 (two) times daily.    Marland Kitchen gabapentin (NEURONTIN) 300 MG capsule Take 300 mg by mouth at bedtime.    Marland Kitchen levothyroxine (SYNTHROID, LEVOTHROID) 125 MCG tablet Take 1 tablet (125 mcg total) by mouth daily before breakfast. 90 tablet 2  . montelukast (SINGULAIR) 10 MG tablet Take 10 mg by mouth at bedtime.    . Multiple Vitamin (MULTIVITAMIN WITH MINERALS) TABS Take 1 tablet by mouth every morning.     No current facility-administered medications on file prior to visit.     BP 98/70 (BP Location: Left Arm)   Temp 98.6 F (37 C) (Oral)   Ht 5\' 3"  (1.6 m)   Wt 115 lb (52.2 kg)   BMI 20.37 kg/m       Objective:   Physical Exam  Constitutional: She is  oriented to person, place, and time. She appears well-developed and well-nourished. No distress.  Cardiovascular: Exam reveals no gallop.   No murmur heard. Musculoskeletal: She exhibits tenderness and deformity.  Trigger finger of right thumb. Nodule felt at base of right thumb.   Neurological: She is alert and oriented to person, place, and time.  Skin: Skin is warm and dry. She is not diaphoretic.  Psychiatric: She has a normal mood and affect. Her behavior is normal. Judgment and thought content normal.  Nursing note and vitals reviewed.     Assessment & Plan:  1. Trigger thumb of right hand - She would like to do conservative therapy first.  - Prescription for thumb spika given.  - Follow up if no improvement in the next 3-4 weeks  - ibuprofen (ADVIL,MOTRIN) 600 MG tablet; Take 1 tablet (600 mg total) by mouth 3 (three) times daily.  Dispense: 90 tablet; Refill: 0 - methylPREDNISolone (MEDROL DOSEPAK) 4 MG TBPK tablet; Take as directed  Dispense: 21 tablet; Refill: 0  2. Need for prophylactic vaccination and inoculation against influenza  - Flu vaccine HIGH DOSE PF (Fluzone High dose)  3. Need for vaccination with 13-polyvalent pneumococcal conjugate vaccine  - Pneumococcal conjugate vaccine 13-valent   [name], NP

## 2017-04-22 ENCOUNTER — Ambulatory Visit: Payer: Medicare Other | Admitting: Adult Health

## 2017-04-24 DIAGNOSIS — J3089 Other allergic rhinitis: Secondary | ICD-10-CM | POA: Diagnosis not present

## 2017-04-24 DIAGNOSIS — J301 Allergic rhinitis due to pollen: Secondary | ICD-10-CM | POA: Diagnosis not present

## 2017-04-24 DIAGNOSIS — J3081 Allergic rhinitis due to animal (cat) (dog) hair and dander: Secondary | ICD-10-CM | POA: Diagnosis not present

## 2017-04-29 DIAGNOSIS — J3089 Other allergic rhinitis: Secondary | ICD-10-CM | POA: Diagnosis not present

## 2017-04-29 DIAGNOSIS — J3081 Allergic rhinitis due to animal (cat) (dog) hair and dander: Secondary | ICD-10-CM | POA: Diagnosis not present

## 2017-04-29 DIAGNOSIS — J301 Allergic rhinitis due to pollen: Secondary | ICD-10-CM | POA: Diagnosis not present

## 2017-05-04 DIAGNOSIS — M5416 Radiculopathy, lumbar region: Secondary | ICD-10-CM | POA: Diagnosis not present

## 2017-05-08 DIAGNOSIS — J3081 Allergic rhinitis due to animal (cat) (dog) hair and dander: Secondary | ICD-10-CM | POA: Diagnosis not present

## 2017-05-08 DIAGNOSIS — J301 Allergic rhinitis due to pollen: Secondary | ICD-10-CM | POA: Diagnosis not present

## 2017-05-08 DIAGNOSIS — J3089 Other allergic rhinitis: Secondary | ICD-10-CM | POA: Diagnosis not present

## 2017-05-15 DIAGNOSIS — J3089 Other allergic rhinitis: Secondary | ICD-10-CM | POA: Diagnosis not present

## 2017-05-15 DIAGNOSIS — J301 Allergic rhinitis due to pollen: Secondary | ICD-10-CM | POA: Diagnosis not present

## 2017-05-15 DIAGNOSIS — J3081 Allergic rhinitis due to animal (cat) (dog) hair and dander: Secondary | ICD-10-CM | POA: Diagnosis not present

## 2017-05-25 ENCOUNTER — Encounter: Payer: Self-pay | Admitting: Family Medicine

## 2017-05-25 ENCOUNTER — Encounter: Payer: Medicare Other | Admitting: Family Medicine

## 2017-05-25 NOTE — Progress Notes (Signed)
Patient left before evaluation. Ophelia Sipe [place], MD

## 2017-06-03 ENCOUNTER — Ambulatory Visit (INDEPENDENT_AMBULATORY_CARE_PROVIDER_SITE_OTHER): Payer: Medicare Other | Admitting: Adult Health

## 2017-06-03 ENCOUNTER — Encounter: Payer: Self-pay | Admitting: Adult Health

## 2017-06-03 VITALS — BP 108/58 | Temp 97.7°F | Wt 116.0 lb

## 2017-06-03 DIAGNOSIS — J3089 Other allergic rhinitis: Secondary | ICD-10-CM | POA: Diagnosis not present

## 2017-06-03 DIAGNOSIS — M65311 Trigger thumb, right thumb: Secondary | ICD-10-CM

## 2017-06-03 DIAGNOSIS — J3081 Allergic rhinitis due to animal (cat) (dog) hair and dander: Secondary | ICD-10-CM | POA: Diagnosis not present

## 2017-06-03 DIAGNOSIS — J301 Allergic rhinitis due to pollen: Secondary | ICD-10-CM | POA: Diagnosis not present

## 2017-06-03 NOTE — Progress Notes (Signed)
Subjective:    Patient ID: Jennifer Deleon, female    DOB: [birth year], 71 y.o.   MRN: [id number]  HPI  71 year old female who  has a past medical history of Arthritis, Asthma, Chicken pox, Colon polyps, Depression, Environmental allergies, Hypothyroid, Osteoporosis, and UTI (lower urinary tract infection).  She presents to the office today for follow up regarding right trigger thumb. Her symptoms have ben present for about 2 months at this point. During her last visit we tried conservative therapy with a thumb splint and oral prednisone. She has not noticed much improvement. She continues to have pain at base of right thumb, a " snapping sensation" and the in ablility to grip objects  Review of Systems See HPI   Past Medical History:  Diagnosis Date  . Arthritis   . Asthma   . Chicken pox   . Colon polyps   . Depression   . Environmental allergies    allergies all year long  . Hypothyroid   . Osteoporosis   . UTI (lower urinary tract infection)     Social History   Socioeconomic History  . Marital status: Married    Spouse name: Not on file  . Number of children: Not on file  . Years of education: Not on file  . Highest education level: Not on file  Social Needs  . Financial resource strain: Not on file  . Food insecurity - worry: Not on file  . Food insecurity - inability: Not on file  . Transportation needs - medical: Not on file  . Transportation needs - non-medical: Not on file  Occupational History  . Not on file  Tobacco Use  . Smoking status: Never Smoker  . Smokeless tobacco: Never Used  Substance and Sexual Activity  . Alcohol use: Yes    Alcohol/week: 4.2 oz    Types: 7 Glasses of wine per week    Comment: a glass of wine with dinner each night  . Drug use: No  . Sexual activity: Not on file  Other Topics Concern  . Not on file  Social History Narrative   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ighter    Married for  11 years    [place] who live in [place]        Past Surgical History:  Procedure Laterality Date  . CATARACT EXTRACTION Bilateral 2016  . LAMINECTOMY    . LUMBAR FUSION  2015   l4-L5  . TUBAL LIGATION      Family History  Problem Relation Age of Onset  . Osteoarthritis Mother   . Osteoarthritis Maternal Grandmother   . Osteoarthritis Father   . Hypertension Father   . Heart failure Father   . Prostate cancer Paternal Grandfather        mets to colon  . Arthritis Unknown   . Prostate cancer Unknown   . Mental illness Unknown     Allergies  Allergen Reactions  . Augmentin [Amoxicillin-Pot Clavulanate] Diarrhea and Other (See Comments)    Severe Stomach cramps  . Bactrim [Sulfamethoxazole-Trimethoprim] Swelling    Facial and lip swelling    Current Outpatient Medications on File Prior to Visit  Medication Sig Dispense Refill  . azelastine (ASTELIN) 0.1 % nasal spray Place 1 spray into both nostrils 2 (two) times daily. Use in each nostril as directed    . beclomethasone (QVAR) 40 MCG/ACT inhaler Inhale 2 puffs into the lungs 2 (two) times daily.    Marland Kitchen buPROPion (WELLBUTRIN XL) 300 MG  24 hr tablet Take 300 mg by mouth every morning.    . calcium-vitamin D (OSCAL-500) 500-400 MG-UNIT per tablet Take 1 tablet by mouth 2 (two) times daily with a meal.    . cetirizine (ZYRTEC) 10 MG tablet Take 10 mg by mouth every morning.    . diphenhydrAMINE (BENADRYL) 25 MG tablet Take 25 mg by mouth every 6 (six) hours as needed for allergies.    Marland Kitchen docusate sodium (COLACE) 100 MG capsule Take 100 mg by mouth at bedtime.    . DULoxetine (CYMBALTA) 60 MG capsule     . fexofenadine (ALLER-EASE) 180 MG tablet Take 180 mg by mouth every morning.    . fluticasone (FLONASE) 50 MCG/ACT nasal spray Place 1 spray into both nostrils 2 (two) times daily.    Marland Kitchen gabapentin (NEURONTIN) 300 MG capsule Take 300 mg 2 (two) times daily by mouth.     . levothyroxine (SYNTHROID, LEVOTHROID) 125 MCG tablet Take 1 tablet (125 mcg total) by mouth daily  before breakfast. 90 tablet 2  . montelukast (SINGULAIR) 10 MG tablet Take 10 mg by mouth at bedtime.    . Multiple Vitamin (MULTIVITAMIN WITH MINERALS) TABS Take 1 tablet by mouth every morning.     No current facility-administered medications on file prior to visit.     BP (!) 108/58 (BP Location: Left Arm)   Temp 97.7 F (36.5 C) (Oral)   Wt 116 lb (52.6 kg)   BMI 20.55 kg/m       Objective:   Physical Exam  Constitutional: She is oriented to person, place, and time. She appears well-developed and well-nourished. No distress.  Cardiovascular: Normal rate, regular rhythm, normal heart sounds and intact distal pulses. Exam reveals no friction rub.  No murmur heard. Pulmonary/Chest: Effort normal and breath sounds normal. No respiratory distress. She has no wheezes. She has no rales. She exhibits no tenderness.  Musculoskeletal: She exhibits tenderness.  Nodule at base of thumb. Snapping sensation with movement   Neurological: She is alert and oriented to person, place, and time.  Skin: Skin is warm and dry. No rash noted. She is not diaphoretic. No erythema. No pallor.  Psychiatric: She has a normal mood and affect. Her behavior is normal. Judgment normal.  Nursing note and vitals reviewed.     Assessment & Plan:  1. Trigger thumb of right hand  - Ambulatory referral to Hand Surgery for steroid injection   [name], NP

## 2017-06-10 DIAGNOSIS — J3081 Allergic rhinitis due to animal (cat) (dog) hair and dander: Secondary | ICD-10-CM | POA: Diagnosis not present

## 2017-06-10 DIAGNOSIS — M180 Bilateral primary osteoarthritis of first carpometacarpal joints: Secondary | ICD-10-CM | POA: Diagnosis not present

## 2017-06-10 DIAGNOSIS — J301 Allergic rhinitis due to pollen: Secondary | ICD-10-CM | POA: Diagnosis not present

## 2017-06-10 DIAGNOSIS — M79641 Pain in right hand: Secondary | ICD-10-CM | POA: Diagnosis not present

## 2017-06-10 DIAGNOSIS — J3089 Other allergic rhinitis: Secondary | ICD-10-CM | POA: Diagnosis not present

## 2017-06-10 DIAGNOSIS — M19041 Primary osteoarthritis, right hand: Secondary | ICD-10-CM | POA: Diagnosis not present

## 2017-06-10 DIAGNOSIS — M19042 Primary osteoarthritis, left hand: Secondary | ICD-10-CM | POA: Diagnosis not present

## 2017-06-10 DIAGNOSIS — M65311 Trigger thumb, right thumb: Secondary | ICD-10-CM | POA: Diagnosis not present

## 2017-06-19 DIAGNOSIS — J3089 Other allergic rhinitis: Secondary | ICD-10-CM | POA: Diagnosis not present

## 2017-06-19 DIAGNOSIS — J3081 Allergic rhinitis due to animal (cat) (dog) hair and dander: Secondary | ICD-10-CM | POA: Diagnosis not present

## 2017-06-19 DIAGNOSIS — J301 Allergic rhinitis due to pollen: Secondary | ICD-10-CM | POA: Diagnosis not present

## 2017-06-26 DIAGNOSIS — J3081 Allergic rhinitis due to animal (cat) (dog) hair and dander: Secondary | ICD-10-CM | POA: Diagnosis not present

## 2017-06-26 DIAGNOSIS — J301 Allergic rhinitis due to pollen: Secondary | ICD-10-CM | POA: Diagnosis not present

## 2017-06-26 DIAGNOSIS — J3089 Other allergic rhinitis: Secondary | ICD-10-CM | POA: Diagnosis not present

## 2017-07-09 DIAGNOSIS — J301 Allergic rhinitis due to pollen: Secondary | ICD-10-CM | POA: Diagnosis not present

## 2017-07-09 DIAGNOSIS — J3081 Allergic rhinitis due to animal (cat) (dog) hair and dander: Secondary | ICD-10-CM | POA: Diagnosis not present

## 2017-07-09 DIAGNOSIS — J3089 Other allergic rhinitis: Secondary | ICD-10-CM | POA: Diagnosis not present

## 2017-07-13 DIAGNOSIS — M65311 Trigger thumb, right thumb: Secondary | ICD-10-CM | POA: Diagnosis not present

## 2017-07-13 DIAGNOSIS — M778 Other enthesopathies, not elsewhere classified: Secondary | ICD-10-CM | POA: Diagnosis not present

## 2017-07-16 DIAGNOSIS — J301 Allergic rhinitis due to pollen: Secondary | ICD-10-CM | POA: Diagnosis not present

## 2017-07-16 DIAGNOSIS — J3089 Other allergic rhinitis: Secondary | ICD-10-CM | POA: Diagnosis not present

## 2017-07-16 DIAGNOSIS — J3081 Allergic rhinitis due to animal (cat) (dog) hair and dander: Secondary | ICD-10-CM | POA: Diagnosis not present

## 2017-07-21 DIAGNOSIS — J301 Allergic rhinitis due to pollen: Secondary | ICD-10-CM | POA: Diagnosis not present

## 2017-07-21 DIAGNOSIS — H1045 Other chronic allergic conjunctivitis: Secondary | ICD-10-CM | POA: Diagnosis not present

## 2017-07-21 DIAGNOSIS — J3081 Allergic rhinitis due to animal (cat) (dog) hair and dander: Secondary | ICD-10-CM | POA: Diagnosis not present

## 2017-07-21 DIAGNOSIS — J3089 Other allergic rhinitis: Secondary | ICD-10-CM | POA: Diagnosis not present

## 2017-07-23 DIAGNOSIS — J301 Allergic rhinitis due to pollen: Secondary | ICD-10-CM | POA: Diagnosis not present

## 2017-07-23 DIAGNOSIS — J3089 Other allergic rhinitis: Secondary | ICD-10-CM | POA: Diagnosis not present

## 2017-07-23 DIAGNOSIS — J3081 Allergic rhinitis due to animal (cat) (dog) hair and dander: Secondary | ICD-10-CM | POA: Diagnosis not present

## 2017-07-28 DIAGNOSIS — J3081 Allergic rhinitis due to animal (cat) (dog) hair and dander: Secondary | ICD-10-CM | POA: Diagnosis not present

## 2017-07-28 DIAGNOSIS — J3089 Other allergic rhinitis: Secondary | ICD-10-CM | POA: Diagnosis not present

## 2017-07-28 DIAGNOSIS — J301 Allergic rhinitis due to pollen: Secondary | ICD-10-CM | POA: Diagnosis not present

## 2017-08-05 DIAGNOSIS — J3081 Allergic rhinitis due to animal (cat) (dog) hair and dander: Secondary | ICD-10-CM | POA: Diagnosis not present

## 2017-08-05 DIAGNOSIS — J301 Allergic rhinitis due to pollen: Secondary | ICD-10-CM | POA: Diagnosis not present

## 2017-08-05 DIAGNOSIS — J3089 Other allergic rhinitis: Secondary | ICD-10-CM | POA: Diagnosis not present

## 2017-08-17 DIAGNOSIS — J3089 Other allergic rhinitis: Secondary | ICD-10-CM | POA: Diagnosis not present

## 2017-08-17 DIAGNOSIS — J3081 Allergic rhinitis due to animal (cat) (dog) hair and dander: Secondary | ICD-10-CM | POA: Diagnosis not present

## 2017-08-17 DIAGNOSIS — J301 Allergic rhinitis due to pollen: Secondary | ICD-10-CM | POA: Diagnosis not present

## 2017-08-18 DIAGNOSIS — R490 Dysphonia: Secondary | ICD-10-CM | POA: Diagnosis not present

## 2017-08-18 DIAGNOSIS — K219 Gastro-esophageal reflux disease without esophagitis: Secondary | ICD-10-CM | POA: Diagnosis not present

## 2017-08-21 DIAGNOSIS — J301 Allergic rhinitis due to pollen: Secondary | ICD-10-CM | POA: Diagnosis not present

## 2017-08-21 DIAGNOSIS — J3089 Other allergic rhinitis: Secondary | ICD-10-CM | POA: Diagnosis not present

## 2017-08-21 DIAGNOSIS — J3081 Allergic rhinitis due to animal (cat) (dog) hair and dander: Secondary | ICD-10-CM | POA: Diagnosis not present

## 2017-08-25 DIAGNOSIS — J301 Allergic rhinitis due to pollen: Secondary | ICD-10-CM | POA: Diagnosis not present

## 2017-08-25 DIAGNOSIS — J3089 Other allergic rhinitis: Secondary | ICD-10-CM | POA: Diagnosis not present

## 2017-08-25 DIAGNOSIS — J3081 Allergic rhinitis due to animal (cat) (dog) hair and dander: Secondary | ICD-10-CM | POA: Diagnosis not present

## 2017-08-28 DIAGNOSIS — J3081 Allergic rhinitis due to animal (cat) (dog) hair and dander: Secondary | ICD-10-CM | POA: Diagnosis not present

## 2017-08-28 DIAGNOSIS — J301 Allergic rhinitis due to pollen: Secondary | ICD-10-CM | POA: Diagnosis not present

## 2017-08-28 DIAGNOSIS — J3089 Other allergic rhinitis: Secondary | ICD-10-CM | POA: Diagnosis not present

## 2017-09-02 DIAGNOSIS — J301 Allergic rhinitis due to pollen: Secondary | ICD-10-CM | POA: Diagnosis not present

## 2017-09-02 DIAGNOSIS — J3089 Other allergic rhinitis: Secondary | ICD-10-CM | POA: Diagnosis not present

## 2017-09-02 DIAGNOSIS — J3081 Allergic rhinitis due to animal (cat) (dog) hair and dander: Secondary | ICD-10-CM | POA: Diagnosis not present

## 2017-09-09 DIAGNOSIS — J3089 Other allergic rhinitis: Secondary | ICD-10-CM | POA: Diagnosis not present

## 2017-09-09 DIAGNOSIS — J3081 Allergic rhinitis due to animal (cat) (dog) hair and dander: Secondary | ICD-10-CM | POA: Diagnosis not present

## 2017-09-09 DIAGNOSIS — J301 Allergic rhinitis due to pollen: Secondary | ICD-10-CM | POA: Diagnosis not present

## 2017-09-16 DIAGNOSIS — J3081 Allergic rhinitis due to animal (cat) (dog) hair and dander: Secondary | ICD-10-CM | POA: Diagnosis not present

## 2017-09-16 DIAGNOSIS — J301 Allergic rhinitis due to pollen: Secondary | ICD-10-CM | POA: Diagnosis not present

## 2017-09-16 DIAGNOSIS — J3089 Other allergic rhinitis: Secondary | ICD-10-CM | POA: Diagnosis not present

## 2017-09-29 DIAGNOSIS — R490 Dysphonia: Secondary | ICD-10-CM | POA: Diagnosis not present

## 2017-09-29 DIAGNOSIS — J3089 Other allergic rhinitis: Secondary | ICD-10-CM | POA: Diagnosis not present

## 2017-09-29 DIAGNOSIS — K219 Gastro-esophageal reflux disease without esophagitis: Secondary | ICD-10-CM | POA: Diagnosis not present

## 2017-09-29 DIAGNOSIS — J301 Allergic rhinitis due to pollen: Secondary | ICD-10-CM | POA: Diagnosis not present

## 2017-09-29 DIAGNOSIS — J3081 Allergic rhinitis due to animal (cat) (dog) hair and dander: Secondary | ICD-10-CM | POA: Diagnosis not present

## 2017-09-30 ENCOUNTER — Other Ambulatory Visit: Payer: Self-pay | Admitting: Adult Health

## 2017-09-30 DIAGNOSIS — Z1231 Encounter for screening mammogram for malignant neoplasm of breast: Secondary | ICD-10-CM

## 2017-10-12 DIAGNOSIS — J3081 Allergic rhinitis due to animal (cat) (dog) hair and dander: Secondary | ICD-10-CM | POA: Diagnosis not present

## 2017-10-12 DIAGNOSIS — J301 Allergic rhinitis due to pollen: Secondary | ICD-10-CM | POA: Diagnosis not present

## 2017-10-12 DIAGNOSIS — J3089 Other allergic rhinitis: Secondary | ICD-10-CM | POA: Diagnosis not present

## 2017-10-19 DIAGNOSIS — J3081 Allergic rhinitis due to animal (cat) (dog) hair and dander: Secondary | ICD-10-CM | POA: Diagnosis not present

## 2017-10-19 DIAGNOSIS — J3089 Other allergic rhinitis: Secondary | ICD-10-CM | POA: Diagnosis not present

## 2017-10-19 DIAGNOSIS — J301 Allergic rhinitis due to pollen: Secondary | ICD-10-CM | POA: Diagnosis not present

## 2017-10-20 ENCOUNTER — Ambulatory Visit
Admission: RE | Admit: 2017-10-20 | Discharge: 2017-10-20 | Disposition: A | Discharge status: Home / Self Care | Payer: Medicare Other | Source: Ambulatory Visit | Attending: Adult Health | Admitting: Adult Health

## 2017-10-20 DIAGNOSIS — Z1231 Encounter for screening mammogram for malignant neoplasm of breast: Secondary | ICD-10-CM | POA: Diagnosis not present

## 2017-10-27 DIAGNOSIS — J301 Allergic rhinitis due to pollen: Secondary | ICD-10-CM | POA: Diagnosis not present

## 2017-10-27 DIAGNOSIS — J3081 Allergic rhinitis due to animal (cat) (dog) hair and dander: Secondary | ICD-10-CM | POA: Diagnosis not present

## 2017-10-27 DIAGNOSIS — J3089 Other allergic rhinitis: Secondary | ICD-10-CM | POA: Diagnosis not present

## 2017-11-04 DIAGNOSIS — J301 Allergic rhinitis due to pollen: Secondary | ICD-10-CM | POA: Diagnosis not present

## 2017-11-04 DIAGNOSIS — J3081 Allergic rhinitis due to animal (cat) (dog) hair and dander: Secondary | ICD-10-CM | POA: Diagnosis not present

## 2017-11-04 DIAGNOSIS — J3089 Other allergic rhinitis: Secondary | ICD-10-CM | POA: Diagnosis not present

## 2017-11-13 DIAGNOSIS — J3089 Other allergic rhinitis: Secondary | ICD-10-CM | POA: Diagnosis not present

## 2017-11-13 DIAGNOSIS — J301 Allergic rhinitis due to pollen: Secondary | ICD-10-CM | POA: Diagnosis not present

## 2017-11-13 DIAGNOSIS — J3081 Allergic rhinitis due to animal (cat) (dog) hair and dander: Secondary | ICD-10-CM | POA: Diagnosis not present

## 2017-11-17 DIAGNOSIS — M2011 Hallux valgus (acquired), right foot: Secondary | ICD-10-CM | POA: Diagnosis not present

## 2017-11-17 DIAGNOSIS — M722 Plantar fascial fibromatosis: Secondary | ICD-10-CM | POA: Diagnosis not present

## 2017-11-17 DIAGNOSIS — M2012 Hallux valgus (acquired), left foot: Secondary | ICD-10-CM | POA: Diagnosis not present

## 2017-11-20 DIAGNOSIS — J3081 Allergic rhinitis due to animal (cat) (dog) hair and dander: Secondary | ICD-10-CM | POA: Diagnosis not present

## 2017-11-20 DIAGNOSIS — J301 Allergic rhinitis due to pollen: Secondary | ICD-10-CM | POA: Diagnosis not present

## 2017-11-20 DIAGNOSIS — J3089 Other allergic rhinitis: Secondary | ICD-10-CM | POA: Diagnosis not present

## 2017-11-25 DIAGNOSIS — R490 Dysphonia: Secondary | ICD-10-CM | POA: Diagnosis not present

## 2017-11-25 DIAGNOSIS — J383 Other diseases of vocal cords: Secondary | ICD-10-CM | POA: Diagnosis not present

## 2017-11-25 DIAGNOSIS — K219 Gastro-esophageal reflux disease without esophagitis: Secondary | ICD-10-CM | POA: Diagnosis not present

## 2017-11-25 DIAGNOSIS — J384 Edema of larynx: Secondary | ICD-10-CM | POA: Diagnosis not present

## 2017-11-27 DIAGNOSIS — J3081 Allergic rhinitis due to animal (cat) (dog) hair and dander: Secondary | ICD-10-CM | POA: Diagnosis not present

## 2017-11-27 DIAGNOSIS — J3089 Other allergic rhinitis: Secondary | ICD-10-CM | POA: Diagnosis not present

## 2017-11-27 DIAGNOSIS — J301 Allergic rhinitis due to pollen: Secondary | ICD-10-CM | POA: Diagnosis not present

## 2017-12-02 DIAGNOSIS — M5416 Radiculopathy, lumbar region: Secondary | ICD-10-CM | POA: Diagnosis not present

## 2017-12-03 ENCOUNTER — Encounter: Payer: Self-pay | Admitting: Family Medicine

## 2017-12-06 ENCOUNTER — Emergency Department (HOSPITAL_COMMUNITY)
Admission: EM | Admit: 2017-12-06 | Discharge: 2017-12-06 | Disposition: A | Discharge status: Home / Self Care | Payer: Medicare Other | Attending: Emergency Medicine | Admitting: Emergency Medicine

## 2017-12-06 ENCOUNTER — Encounter (HOSPITAL_COMMUNITY): Payer: Self-pay | Admitting: *Deleted

## 2017-12-06 DIAGNOSIS — E039 Hypothyroidism, unspecified: Secondary | ICD-10-CM | POA: Diagnosis not present

## 2017-12-06 DIAGNOSIS — R5383 Other fatigue: Secondary | ICD-10-CM | POA: Insufficient documentation

## 2017-12-06 DIAGNOSIS — J45909 Unspecified asthma, uncomplicated: Secondary | ICD-10-CM | POA: Insufficient documentation

## 2017-12-06 DIAGNOSIS — N390 Urinary tract infection, site not specified: Secondary | ICD-10-CM

## 2017-12-06 DIAGNOSIS — Z79899 Other long term (current) drug therapy: Secondary | ICD-10-CM | POA: Diagnosis not present

## 2017-12-06 DIAGNOSIS — R531 Weakness: Secondary | ICD-10-CM | POA: Diagnosis present

## 2017-12-06 LAB — BASIC METABOLIC PANEL
ANION GAP: 11 (ref 5–15)
BUN: 14 mg/dL (ref 6–20)
CALCIUM: 9 mg/dL (ref 8.9–10.3)
CO2: 24 mmol/L (ref 22–32)
CREATININE: 0.83 mg/dL (ref 0.44–1.00)
Chloride: 102 mmol/L (ref 101–111)
GFR calc Af Amer: >60 mL/min (ref >60)
GLUCOSE: 135 mg/dL — AB (ref 65–99)
Potassium: 3.3 mmol/L — ABNORMAL LOW (ref 3.5–5.1)
Sodium: 137 mmol/L (ref 135–145)

## 2017-12-06 LAB — URINALYSIS, ROUTINE W REFLEX MICROSCOPIC
Bilirubin Urine: NEGATIVE
Glucose, UA: NEGATIVE mg/dL
Ketones, ur: NEGATIVE mg/dL
NITRITE: NEGATIVE
Protein, ur: 30 mg/dL — AB
SPECIFIC GRAVITY, URINE: 1.011 (ref 1.005–1.030)
WBC, UA: >50 WBC/hpf — ABNORMAL HIGH (ref 0–5)
pH: 6 (ref 5.0–8.0)

## 2017-12-06 MED ORDER — CEPHALEXIN 500 MG PO CAPS: 500.0000 mg | ORAL_CAPSULE | Freq: Four times a day (QID) | ORAL | 0 refills | Status: DC

## 2017-12-06 MED ORDER — SODIUM CHLORIDE 0.9 % IV BOLUS
1000.0000 mL | Freq: Once | INTRAVENOUS | Status: AC
  Administered 2017-12-06: 1000 mL via INTRAVENOUS

## 2017-12-06 NOTE — Discharge Instructions (Addendum)
Make sure that you are drinking plenty of fluids.  Try to stay out of environments.  Make sure that you are getting plenty of rest.  Return here, if needed, for problems.

## 2017-12-06 NOTE — ED Notes (Signed)
Patient tolerating po at this time. Pt will call out when ready to give urine sample.

## 2017-12-06 NOTE — ED Provider Notes (Signed)
[place] DEPT Provider Note   CSN: [id number] Arrival date & time: 12/06/17  1130     History   Chief Complaint Chief Complaint  Patient presents with  . Dizziness  . Weakness    HPI OAKLYNN STIERWALT is a 72 y.o. female.  HPI   She presents for evaluation of poor appetite, sleepiness, having trouble walking because of feeling weak, and collapsing, because of weakness, without fall or injury.  Onset of symptoms 3 days ago, spontaneously.  No fever, chills, vomiting or diarrhea.  She has had some mild nausea recently, and loose stools.  Stool is brown in color.  She was started on Prilosec 2 months ago for possible reflux.  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  She has been working in the garden recently, and spending time outside.  Her husband is with her and reports that she has been somewhat confused recently, and in the emergency department she frequently asked him to answer questions that she cannot immediately recall the answer to.  She denies depression, or stress.  There are no other known modifying factors.    Past Medical History:  Diagnosis Date  . Arthritis   . Asthma   . Chicken pox   . Colon polyps   . Depression   . Environmental allergies    allergies all year long  . Hypothyroid   . Osteoporosis   . UTI (lower urinary tract infection)     Patient Active Problem List   Diagnosis Date Noted  . Rib fractures 08/11/2015  . Multiple rib fractures 08/11/2015  . Chest wall pain   . Rib fracture   . Tinea corporis 06/13/2015  . Hypothyroidism 04/05/2015    Past Surgical History:  Procedure Laterality Date  . CATARACT EXTRACTION Bilateral 2016  . LAMINECTOMY    . LUMBAR FUSION  2015   l4-L5  . TUBAL LIGATION       OB History   None      Home Medications    Prior to Admission medications   Medication Sig Start Date End Date Taking? Authorizing Provider    azelastine (ASTELIN) 0.1 % nasal spray Place 1 spray into both nostrils 2 (two) times daily. Use in each nostril as directed   Yes Tiajuana Amass, MD  beclomethasone (QVAR) 40 MCG/ACT inhaler Inhale 2 puffs into the lungs 2 (two) times daily.   Yes [provider]  buPROPion (WELLBUTRIN XL) 300 MG 24 hr tablet Take 300 mg by mouth every morning.   Yes [provider]  calcium-vitamin D (OSCAL-500) 500-400 MG-UNIT per tablet Take 1 tablet by mouth 2 (two) times daily with a meal.   Yes [provider]  cetirizine (ZYRTEC) 10 MG tablet Take 10 mg by mouth every morning.   Yes [provider]  diphenhydrAMINE (BENADRYL) 25 MG tablet Take 25 mg by mouth every 6 (six) hours as needed for allergies.   Yes [provider]  docusate sodium (COLACE) 100 MG capsule Take 100 mg by mouth 3 times/day as needed-between meals & bedtime for mild constipation.    Yes [provider]  DULoxetine (CYMBALTA) 60 MG capsule Take 60 mg by mouth daily.  12/23/16  Yes [provider]  fexofenadine (ALLER-EASE) 180 MG tablet Take 180 mg by mouth every morning.   Yes [provider]  fluticasone (FLONASE) 50 MCG/ACT nasal spray Place 1 spray into both nostrils  2 (two) times daily. 12/23/16  Yes [provider]  gabapentin (NEURONTIN) 300 MG capsule Take 300 mg 2 (two) times daily by mouth.    Yes [provider]  ipratropium (ATROVENT) 0.03 % nasal spray Place 2 sprays into both nostrils 2 (two) times daily. 11/16/17  Yes [provider]  levothyroxine (SYNTHROID, LEVOTHROID) 125 MCG tablet Take 1 tablet (125 mcg total) by mouth daily before breakfast. 04/09/17  Yes Nafziger, Tommi Rumps, NP  montelukast (SINGULAIR) 10 MG tablet Take 10 mg by mouth at bedtime.   Yes [provider]  Multiple Vitamin (MULTIVITAMIN WITH MINERALS) TABS Take 1 tablet by mouth every morning.   Yes [provider]  omeprazole (PRILOSEC) 20 MG  capsule Take 20 mg by mouth daily.   Yes Leta Baptist, MD  cephALEXin (KEFLEX) 500 MG capsule Take 1 capsule (500 mg total) by mouth 4 (four) times daily. 12/06/17   Daleen Bo, MD    Family History Family History  Problem Relation Age of Onset  . Osteoarthritis Mother   . Osteoarthritis Maternal Grandmother   . Osteoarthritis Father   . Hypertension Father   . Heart failure Father   . Prostate cancer Paternal Grandfather        mets to colon  . Arthritis Unknown   . Prostate cancer Unknown   . Mental illness Unknown     Social History Social History   Tobacco Use  . Smoking status: Never Smoker  . Smokeless tobacco: Never Used  Substance Use Topics  . Alcohol use: Yes    Alcohol/week: 4.2 oz    Types: 7 Glasses of wine per week    Comment: a glass of wine with dinner each night  . Drug use: No     Allergies   Augmentin [amoxicillin-pot clavulanate] and Bactrim [sulfamethoxazole-trimethoprim]   Review of Systems Review of Systems  All other systems reviewed and are negative.    Physical Exam Updated Vital Signs BP 105/71 (BP Location: Left Arm)   Pulse 80   Temp 98.3 F (36.8 C) (Oral)   Resp 17   SpO2 98%   Physical Exam  Constitutional: She is oriented to person, place, and time. She appears well-developed. No distress.  Elderly, frail  HENT:  Head: Normocephalic and atraumatic.  Eyes: Pupils are equal, round, and reactive to light. Conjunctivae and EOM are normal.  Neck: Normal range of motion and phonation normal. Neck supple.  Cardiovascular: Normal rate and regular rhythm.  Pulmonary/Chest: Effort normal and breath sounds normal. No respiratory distress. She exhibits no tenderness.  Abdominal: Soft. She exhibits no distension. There is no tenderness. There is no guarding.  Musculoskeletal: Normal range of motion. She exhibits no edema or deformity.  Neurological: She is alert and oriented to person, place, and time. She exhibits normal muscle tone.    Skin: Skin is warm and dry.  Psychiatric:  Anxious and appears depressed.  Nursing note and vitals reviewed.    ED Treatments / Results  Labs (all labs ordered are listed, but only abnormal results are displayed) Labs Reviewed  BASIC METABOLIC PANEL - Abnormal; Notable for the following components:      Result Value   Potassium 3.3 (*)    Glucose, Bld 135 (*)    All other components within normal limits  CBC - Abnormal; Notable for the following components:   Platelets 80 (*)    All other components within normal limits  URINALYSIS, ROUTINE W REFLEX MICROSCOPIC - Abnormal; Notable for the  following components:   APPearance HAZY (*)    Hgb urine dipstick SMALL (*)    Protein, ur 30 (*)    Leukocytes, UA LARGE (*)    WBC, UA >50 (*)    Bacteria, UA RARE (*)    Non Squamous Epithelial 0-5 (*)    All other components within normal limits  URINE CULTURE    EKG EKG Interpretation  Date/Time:  Sunday December 06 2017 11:50:31 EDT Ventricular Rate:  84 PR Interval:    QRS Duration: 99 QT Interval:  344 QTC Calculation: 407 R Axis:   0 Text Interpretation:  Sinus rhythm Anteroseptal infarct, old Borderline repolarization abnormality Since last tracing Nonspecific ST abnormality is new Confirmed by Laurette Villescas ([id number]) on 12/06/2017 1:18:06 PM   Radiology No results found.  Procedures Procedures (including critical care time)  Medications Ordered in ED Medications  sodium chloride 0.9 % bolus 1,000 mL (0 mLs Intravenous Stopped 12/06/17 1627)     Initial Impression / Assessment and Plan / ED Course  I have reviewed the triage vital signs and the nursing notes.  Pertinent labs & imaging results that were available during my care of the patient were reviewed by me and considered in my medical decision making (see chart for details).  Clinical Course as of Dec 08 943  Sun Dec 06, 2017  1546 She is tolerating oral liquids now.  Orthostatic blood pressure and pulses,  marginal decrease in blood pressure and mild elevation in pulse going from supine to standing.   [EW]    Clinical Course User Index [EW] Allix Blomquist, MD     Patient Vitals for the past 24 hrs:  BP Temp Temp src Pulse Resp SpO2  12/06/17 1713 105/71 - - 80 17 98 %  12/06/17 1515 103/63 98.3 F (36.8 C) Oral 81 18 99 %  12/06/17 1140 104/70 98 F (36.7 C) Oral 84 18 100 %    3:46 PM Reevaluation with update and discussion. After initial assessment and treatment, an updated evaluation reveals she is alert and comfortable, tolerating oral fluids. Findings discussed and questions answered.. Tushar Enns   Medical Decision Making: Malaise, likely d/t UTI. Doubt pyelonephritis or sepsis. Doubt metabolic instability.  CRITICAL CARE- NO Performed by: Kem Hensen   Nursing Notes Reviewed/ Care Coordinated Applicable Imaging Reviewed Interpretation of Laboratory Data incorporated into ED treatment  The patient appears reasonably screened and/or stabilized for discharge and I doubt any other medical condition or other EMC requiring further screening, evaluation, or treatment in the ED at this time prior to discharge.  Plan: Home Medications- continue usual; Home Treatments- rest/fluids; return here if the recommended treatment, does not improve the symptoms; Recommended follow up- PCP 3-5 days for checkup     Final Clinical Impressions(s) / ED Diagnoses   Final diagnoses:  Urinary tract infection without hematuria, site unspecified  Fatigue, unspecified type    ED Discharge Orders        Ordered    cephALEXin (KEFLEX) 500 MG capsule  4 times daily     06 /02/19 1705       Daleen Bo, MD 12/07/17 0945

## 2017-12-06 NOTE — ED Notes (Signed)
NT gave pt some sprite for PO challenge

## 2017-12-06 NOTE — ED Triage Notes (Signed)
Pt complains of weakness, dizziness, diarrhea, no appetite x 3 days. Pt states she fell yesterday and today, denies injury or loss of consciousness.

## 2017-12-06 NOTE — ED Notes (Signed)
Pt aware that urine sample needed, pt reports she cannot pee at this time and will let us know when she can

## 2017-12-07 LAB — CBC
HCT: 42.6 % (ref 36.0–46.0)
Hemoglobin: 14.6 g/dL (ref 12.0–15.0)
MCH: 31.3 pg (ref 26.0–34.0)
MCHC: 34.3 g/dL (ref 30.0–36.0)
MCV: 91.4 fL (ref 78.0–100.0)
PLATELETS: 80 10*3/uL — AB (ref 150–400)
RBC: 4.66 MIL/uL (ref 3.87–5.11)
RDW: 12.5 % (ref 11.5–15.5)
WBC: 4 10*3/uL (ref 4.0–10.5)

## 2017-12-08 LAB — URINE CULTURE: CULTURE: NO GROWTH

## 2017-12-09 DIAGNOSIS — J385 Laryngeal spasm: Secondary | ICD-10-CM | POA: Diagnosis not present

## 2017-12-09 DIAGNOSIS — J383 Other diseases of vocal cords: Secondary | ICD-10-CM | POA: Diagnosis not present

## 2017-12-09 DIAGNOSIS — R490 Dysphonia: Secondary | ICD-10-CM | POA: Diagnosis not present

## 2017-12-10 DIAGNOSIS — J3081 Allergic rhinitis due to animal (cat) (dog) hair and dander: Secondary | ICD-10-CM | POA: Diagnosis not present

## 2017-12-10 DIAGNOSIS — J301 Allergic rhinitis due to pollen: Secondary | ICD-10-CM | POA: Diagnosis not present

## 2017-12-10 DIAGNOSIS — J3089 Other allergic rhinitis: Secondary | ICD-10-CM | POA: Diagnosis not present

## 2017-12-17 DIAGNOSIS — J383 Other diseases of vocal cords: Secondary | ICD-10-CM | POA: Diagnosis not present

## 2017-12-17 DIAGNOSIS — J385 Laryngeal spasm: Secondary | ICD-10-CM | POA: Diagnosis not present

## 2017-12-17 DIAGNOSIS — R490 Dysphonia: Secondary | ICD-10-CM | POA: Diagnosis not present

## 2017-12-22 DIAGNOSIS — J3081 Allergic rhinitis due to animal (cat) (dog) hair and dander: Secondary | ICD-10-CM | POA: Diagnosis not present

## 2017-12-22 DIAGNOSIS — J3089 Other allergic rhinitis: Secondary | ICD-10-CM | POA: Diagnosis not present

## 2017-12-22 DIAGNOSIS — J301 Allergic rhinitis due to pollen: Secondary | ICD-10-CM | POA: Diagnosis not present

## 2017-12-24 DIAGNOSIS — R490 Dysphonia: Secondary | ICD-10-CM | POA: Diagnosis not present

## 2017-12-24 DIAGNOSIS — J385 Laryngeal spasm: Secondary | ICD-10-CM | POA: Diagnosis not present

## 2017-12-24 DIAGNOSIS — J383 Other diseases of vocal cords: Secondary | ICD-10-CM | POA: Diagnosis not present

## 2018-01-12 DIAGNOSIS — J383 Other diseases of vocal cords: Secondary | ICD-10-CM | POA: Diagnosis not present

## 2018-01-12 DIAGNOSIS — J385 Laryngeal spasm: Secondary | ICD-10-CM | POA: Diagnosis not present

## 2018-01-12 DIAGNOSIS — R490 Dysphonia: Secondary | ICD-10-CM | POA: Diagnosis not present

## 2018-01-13 DIAGNOSIS — J3081 Allergic rhinitis due to animal (cat) (dog) hair and dander: Secondary | ICD-10-CM | POA: Diagnosis not present

## 2018-01-13 DIAGNOSIS — J3089 Other allergic rhinitis: Secondary | ICD-10-CM | POA: Diagnosis not present

## 2018-01-13 DIAGNOSIS — J301 Allergic rhinitis due to pollen: Secondary | ICD-10-CM | POA: Diagnosis not present

## 2018-01-19 DIAGNOSIS — M2012 Hallux valgus (acquired), left foot: Secondary | ICD-10-CM | POA: Diagnosis not present

## 2018-01-19 DIAGNOSIS — M2011 Hallux valgus (acquired), right foot: Secondary | ICD-10-CM | POA: Diagnosis not present

## 2018-01-20 ENCOUNTER — Other Ambulatory Visit: Payer: Self-pay | Admitting: Family Medicine

## 2018-01-20 MED ORDER — LEVOTHYROXINE SODIUM 125 MCG PO TABS: 125.0000 ug | ORAL_TABLET | Freq: Every day | ORAL | 0 refills | Status: DC

## 2018-01-21 DIAGNOSIS — J3089 Other allergic rhinitis: Secondary | ICD-10-CM | POA: Diagnosis not present

## 2018-01-21 DIAGNOSIS — J3081 Allergic rhinitis due to animal (cat) (dog) hair and dander: Secondary | ICD-10-CM | POA: Diagnosis not present

## 2018-01-21 DIAGNOSIS — J301 Allergic rhinitis due to pollen: Secondary | ICD-10-CM | POA: Diagnosis not present

## 2018-01-26 DIAGNOSIS — F4323 Adjustment disorder with mixed anxiety and depressed mood: Secondary | ICD-10-CM | POA: Diagnosis not present

## 2018-02-08 DIAGNOSIS — J301 Allergic rhinitis due to pollen: Secondary | ICD-10-CM | POA: Diagnosis not present

## 2018-02-08 DIAGNOSIS — J3081 Allergic rhinitis due to animal (cat) (dog) hair and dander: Secondary | ICD-10-CM | POA: Diagnosis not present

## 2018-02-08 DIAGNOSIS — J3089 Other allergic rhinitis: Secondary | ICD-10-CM | POA: Diagnosis not present

## 2018-02-18 DIAGNOSIS — J3081 Allergic rhinitis due to animal (cat) (dog) hair and dander: Secondary | ICD-10-CM | POA: Diagnosis not present

## 2018-02-18 DIAGNOSIS — J301 Allergic rhinitis due to pollen: Secondary | ICD-10-CM | POA: Diagnosis not present

## 2018-02-18 DIAGNOSIS — J3089 Other allergic rhinitis: Secondary | ICD-10-CM | POA: Diagnosis not present

## 2018-02-22 DIAGNOSIS — J3081 Allergic rhinitis due to animal (cat) (dog) hair and dander: Secondary | ICD-10-CM | POA: Diagnosis not present

## 2018-02-22 DIAGNOSIS — J301 Allergic rhinitis due to pollen: Secondary | ICD-10-CM | POA: Diagnosis not present

## 2018-02-22 DIAGNOSIS — J3089 Other allergic rhinitis: Secondary | ICD-10-CM | POA: Diagnosis not present

## 2018-02-23 ENCOUNTER — Ambulatory Visit (INDEPENDENT_AMBULATORY_CARE_PROVIDER_SITE_OTHER): Payer: Medicare Other | Admitting: Adult Health

## 2018-02-23 ENCOUNTER — Encounter: Payer: Self-pay | Admitting: Adult Health

## 2018-02-23 VITALS — BP 118/60 | HR 73 | Temp 98.3°F | Wt 111.8 lb

## 2018-02-23 DIAGNOSIS — M25561 Pain in right knee: Secondary | ICD-10-CM

## 2018-02-23 NOTE — Progress Notes (Signed)
Subjective:    Patient ID: ANABELA CRAYTON, female    DOB: 01-30-1946, 72 y.o.   MRN: [id number]  HPI 72 year old female who  has a past medical history of Arthritis, Asthma, Chicken pox, Colon polyps, Depression, Environmental allergies, Hypothyroid, Osteoporosis, and UTI (lower urinary tract infection).  She presents to the office today for the acute complaint of right knee pain x 1 month. She reports that pain is located on the inside of the knee, it is painful to move and put weight on it as well as with certain bending and twisting motions.  She denies any trauma but does work in the garden most days of the week and is a fairly active individual.  She has tried Advil which "takes the edge off" but does not relieve the pain.  She has also tried rest and ice which do not relieve the pain   Review of Systems See HPI   Past Medical History:  Diagnosis Date  . Arthritis   . Asthma   . Chicken pox   . Colon polyps   . Depression   . Environmental allergies    allergies all year long  . Hypothyroid   . Osteoporosis   . UTI (lower urinary tract infection)     Social History   Socioeconomic History  . Marital status: Married    Spouse name: Not on file  . Number of children: Not on file  . Years of education: Not on file  . Highest education level: Not on file  Occupational History  . Not on file  Social Needs  . Financial resource strain: Not on file  . Food insecurity:    Worry: Not on file    Inability: Not on file  . Transportation needs:    Medical: Not on file    Non-medical: Not on file  Tobacco Use  . Smoking status: Never Smoker  . Smokeless tobacco: Never Used  Substance and Sexual Activity  . Alcohol use: Yes    Alcohol/week: 7.0 standard drinks    Types: 7 Glasses of wine per week    Comment: a glass of wine with dinner each night  . Drug use: No  . Sexual activity: Not on file  Lifestyle  . Physical activity:    Days per week: Not on file    Minutes  per session: Not on file  . Stress: Not on file  Relationships  . Social connections:    Talks on phone: Not on file    Gets together: Not on file    Attends religious service: Not on file    Active member of club or organization: Not on file    Attends meetings of clubs or organizations: Not on file    Relationship status: Not on file  . Intimate partner violence:    Fear of current or ex partner: Not on file    Emotionally abused: Not on file    Physically abused: Not on file    Forced sexual activity: Not on file  Other Topics Concern  . 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 CATARACT EXTRACTION Bilateral 2016  . LAMINECTOMY    . LUMBAR FUSION  2015   l4-L5  . TUBAL LIGATION      Family  History  Problem Relation Age of Onset  . Osteoarthritis Mother   . Osteoarthritis Maternal Grandmother   . Osteoarthritis Father   . Hypertension Father   . Heart failure Father   . Prostate cancer Paternal Grandfather        mets to colon  . Arthritis Unknown   . Prostate cancer Unknown   . Mental illness Unknown     Allergies  Allergen Reactions  . Augmentin [Amoxicillin-Pot Clavulanate] Diarrhea and Other (See Comments)    Severe Stomach cramps  . Bactrim [Sulfamethoxazole-Trimethoprim] Swelling    Facial and lip swelling    Current Outpatient Medications on File Prior to Visit  Medication Sig Dispense Refill  . azelastine (ASTELIN) 0.1 % nasal spray Place 1 spray into both nostrils 2 (two) times daily. Use in each nostril as directed    . beclomethasone (QVAR) 40 MCG/ACT inhaler Inhale 2 puffs into the lungs 2 (two) times daily.    Marland Kitchen buPROPion (WELLBUTRIN XL) 300 MG 24 hr tablet Take 300 mg by mouth every morning.    . calcium-vitamin D (OSCAL-500) 500-400 MG-UNIT per tablet Take 1 tablet by mouth 2 (two) times daily with  a meal.    . cetirizine (ZYRTEC) 10 MG tablet Take 10 mg by mouth every morning.    . diphenhydrAMINE (BENADRYL) 25 MG tablet Take 25 mg by mouth every 6 (six) hours as needed for allergies.    Marland Kitchen docusate sodium (COLACE) 100 MG capsule Take 100 mg by mouth 3 times/day as needed-between meals & bedtime for mild constipation.     . DULoxetine (CYMBALTA) 60 MG capsule Take 60 mg by mouth daily.     . fexofenadine (ALLER-EASE) 180 MG tablet Take 180 mg by mouth every morning.    . fluticasone (FLONASE) 50 MCG/ACT nasal spray Place 1 spray into both nostrils 2 (two) times daily.    Marland Kitchen gabapentin (NEURONTIN) 300 MG capsule Take 300 mg 2 (two) times daily by mouth.     Marland Kitchen ipratropium (ATROVENT) 0.03 % nasal spray Place 2 sprays into both nostrils 2 (two) times daily.    Marland Kitchen levothyroxine (SYNTHROID, LEVOTHROID) 125 MCG tablet Take 1 tablet (125 mcg total) by mouth daily before breakfast. **NEEDS YEARLY VISIT** 90 tablet 0  . montelukast (SINGULAIR) 10 MG tablet Take 10 mg by mouth at bedtime.    . Multiple Vitamin (MULTIVITAMIN WITH MINERALS) TABS Take 1 tablet by mouth every morning.    Marland Kitchen omeprazole (PRILOSEC) 20 MG capsule Take 20 mg by mouth daily.     No current facility-administered medications on file prior to visit.     BP 118/60 (BP Location: Left Arm, Patient Position: Sitting, Cuff Size: Normal)   Pulse 73   Temp 98.3 F (36.8 C) (Oral)   Wt 111 lb 12.8 oz (50.7 kg)   SpO2 94%   BMI 19.80 kg/m       Objective:   Physical Exam  Constitutional: She is oriented to person, place, and time. She appears well-developed and well-nourished. No distress.  Cardiovascular: Normal rate, regular rhythm, normal heart sounds and intact distal pulses.  Pulmonary/Chest: Effort normal and breath sounds normal.  Musculoskeletal:       Right knee: She exhibits decreased range of motion, swelling (medial aspect of right knee ) and MCL laxity. She exhibits no effusion, no deformity, no erythema, normal  patellar mobility and no bony tenderness. Tenderness found. MCL tenderness noted. No LCL and no patellar tendon tenderness noted.  No discomfort to medial  aspect of right knee with straight leg raise, knee-to-chest, internal or external rotation.  Limping gait noticed   Neurological: She is alert and oriented to person, place, and time.  Skin: Skin is warm and dry. She is not diaphoretic.  Psychiatric: She has a normal mood and affect. Her behavior is normal. Judgment and thought content normal.  Nursing note and vitals reviewed.     Assessment & Plan:  1. Acute pain of right knee -There is concern for possible MCL injury.We discussed various options including MRI of knee and sports medicine referral.  She opted for sports medicine referral.  Advised to continue with rest ice and anti-inflammatories. - Ambulatory referral to Sports Medicine  [name], NP

## 2018-03-01 ENCOUNTER — Ambulatory Visit (INDEPENDENT_AMBULATORY_CARE_PROVIDER_SITE_OTHER): Payer: Medicare Other | Admitting: Sports Medicine

## 2018-03-01 ENCOUNTER — Encounter: Payer: Self-pay | Admitting: Sports Medicine

## 2018-03-01 ENCOUNTER — Ambulatory Visit (INDEPENDENT_AMBULATORY_CARE_PROVIDER_SITE_OTHER): Payer: Medicare Other

## 2018-03-01 VITALS — BP 100/62 | HR 65 | Ht 63.0 in | Wt 113.0 lb

## 2018-03-01 DIAGNOSIS — G8929 Other chronic pain: Secondary | ICD-10-CM

## 2018-03-01 DIAGNOSIS — M7050 Other bursitis of knee, unspecified knee: Secondary | ICD-10-CM | POA: Diagnosis not present

## 2018-03-01 DIAGNOSIS — J301 Allergic rhinitis due to pollen: Secondary | ICD-10-CM | POA: Diagnosis not present

## 2018-03-01 DIAGNOSIS — J3089 Other allergic rhinitis: Secondary | ICD-10-CM | POA: Diagnosis not present

## 2018-03-01 DIAGNOSIS — M25561 Pain in right knee: Secondary | ICD-10-CM

## 2018-03-01 DIAGNOSIS — M1711 Unilateral primary osteoarthritis, right knee: Secondary | ICD-10-CM | POA: Diagnosis not present

## 2018-03-01 DIAGNOSIS — J3081 Allergic rhinitis due to animal (cat) (dog) hair and dander: Secondary | ICD-10-CM | POA: Diagnosis not present

## 2018-03-01 MED ORDER — DICLOFENAC SODIUM 1 % TD GEL: TRANSDERMAL | 1 refills | Status: DC

## 2018-03-01 NOTE — Patient Instructions (Addendum)
Please perform the exercise program that we have prepared for you and gone over in detail on a daily basis.  In addition to the handout you were provided you can access your program through: [email or web address]   Your unique program code is:   [email or web address]

## 2018-03-01 NOTE — Progress Notes (Signed)
Jennifer Deleon. Jennifer Deleon, [place] at [facility] [place] - 72 y.o. female MRN [id number]  Date of birth: Jun 22, 1946  Visit Date: 03/01/2018  PCP: Jennifer Peng, NP   Referred by: Jennifer Peng, NP  Scribe(s) for today's visit: Wendy Poet, LAT, ATC  SUBJECTIVE:  Jennifer Deleon is here for New Patient (Initial Visit) (R knee pain) .  Referred by: Jennifer Deleon  Her R medial knee pain symptoms INITIALLY: Began about a 1-2 months ago w/ no known MOI.  She states that she enjoys gardening so feels that her knee pain may be due to overuse. Described as moderate sore and aching pain, radiating to R distal h/s Worsened with weight bearing activity, sitting cross-legged, ascending/descending stairs Improved with rest and having her R knee more extended Additional associated symptoms include: slight R knee swelling noted; no mechanical symptoms and no episodes of giving way noted.    At this time symptoms show no change compared to onset  She has been taking Advil and using ice.   REVIEW OF SYSTEMS: Denies night time disturbances. Denies fevers, chills, or night sweats. Denies unexplained weight loss. Denies personal history of cancer. Denies changes in bowel or bladder habits. Denies recent unreported falls. Denies new or worsening dyspnea or wheezing. Denies headaches or dizziness.  Reports numbness, tingling or weakness  In the extremities - some in the R hand due to arthritis Denies dizziness or presyncopal episodes Denies lower extremity edema     HISTORY & PERTINENT PRIOR DATA:  Significant/pertinent history, findings, studies include:  reports that she has never smoked. She has never used smokeless tobacco. No results for input(s): HGBA1C, LABURIC, CREATINE in the last 8760 hours. No specialty comments available. No problems updated.  Otherwise prior history reviewed and updated per electronic medical record.    Past Medical History:  Diagnosis Date  . Arthritis   . Asthma   . Chicken pox   . Colon polyps   . Depression   . Environmental allergies    allergies all year long  . Hypothyroid   . Osteoporosis   . UTI (lower urinary tract infection)    Past Surgical History:  Procedure Laterality Date  . CATARACT EXTRACTION Bilateral 2016  . LAMINECTOMY    . LUMBAR FUSION  2015   l4-L5  . TUBAL LIGATION     family history includes Arthritis in her unknown relative; Heart failure in her father; Hypertension in her father; Mental illness in her unknown relative; Osteoarthritis in her father, maternal grandmother, and mother; Prostate cancer in her paternal grandfather and unknown relative. Social History   Occupational History  . Not on file  Tobacco Use  . Smoking status: Never Smoker  . Smokeless tobacco: Never Used  Substance and Sexual Activity  . Alcohol use: Yes    Alcohol/week: 7.0 standard drinks    Types: 7 Glasses of wine per week    Comment: a glass of wine with dinner each night  . Drug use: No  . Sexual activity: Not on file    OBJECTIVE:  VS:  HT:5\' 3"  (160 cm)   WT:113 lb (51.3 kg)  BMI:20.02    BP:100/62  HR:65bpm  TEMP: ( )  RESP:94 %   PHYSICAL EXAM: CONSTITUTIONAL: Well-developed, Well-nourished and In no acute distress Alert & appropriately interactive. and Not depressed or anxious appearing. RESPIRATORY: No increased work of breathing and Trachea Midline EYES: Pupils are equal., EOM intact without  nystagmus. and No scleral icterus.  Lower extremities: Warm and well perfused Edema: No significant swelling or edema NEURO: unremarkable  MSK Exam: Right knee: Marland Kitchen Well aligned, no significant deformity. . No overlying skin changes. . TTP over Abductor longus tendon as well as medial hamstring musculature with ropey changes of the adductor longus and semitendinosus tendons. . Non tender over Medial and lateral joint lines. . Normal, non-painful Flexion  and extension, terminal flexion does cause some discomfort localized to the medial knee. . Ligamentously stable to Varus and valgus strain, anterior posterior drawer, normal Lockman's. . Normal, non-painful: McMurray's   PROCEDURES & DATA REVIEWED:  . Discussed the foundation of treatment for this condition is physical therapy and/or daily (5-6 days/week) therapeutic exercises, focusing on core strengthening, coordination, neuromuscular control/reeducation.  Therapeutic exercises prescribed per procedure note. . X-Rays obtained today and reviewed with the patient that showed: Mild loss of joint space worse in the medial compartment and lateral patellofemoral compartment.  Mild peaking of the tibial spines.  ASSESSMENT   1. Chronic pain of right knee   2. Primary osteoarthritis of right knee   3. Pes anserine bursitis     PLAN:   Try to avoid direct contact to the anterior aspect of the knee as often as possible but will not specifically limit the amount of kneeling that she performs. Rest the injured area as much as practical Apply ice packs    . Topical diclofenac prescription provided today. . Symptoms are most consistent past anserine bursitis with only mild underlying knee osteoarthritis. . If any lack of improvement can consider injection and follow-up . Please see procedure section and notes. No problem-specific Assessment & Plan notes found for this encounter.  Follow-up: Return in about 2 weeks (around 03/15/2018).      Please see additional documentation for Objective, Assessment and Plan sections. Pertinent additional documentation may be included in corresponding procedure notes, imaging studies, problem based documentation and patient instructions. Please see these sections of the encounter for additional information regarding this visit.  CMA/ATC served as Education administrator during this visit. History, Physical, and Plan performed by medical provider. Documentation and orders reviewed  and attested to.      Gerda Diss, [place] [facility] Physician

## 2018-03-05 ENCOUNTER — Encounter: Payer: Self-pay | Admitting: Sports Medicine

## 2018-03-05 NOTE — Progress Notes (Signed)

## 2018-03-09 DIAGNOSIS — J384 Edema of larynx: Secondary | ICD-10-CM | POA: Diagnosis not present

## 2018-03-09 DIAGNOSIS — J383 Other diseases of vocal cords: Secondary | ICD-10-CM | POA: Diagnosis not present

## 2018-03-15 ENCOUNTER — Ambulatory Visit (INDEPENDENT_AMBULATORY_CARE_PROVIDER_SITE_OTHER): Payer: Medicare Other | Admitting: Sports Medicine

## 2018-03-15 ENCOUNTER — Encounter: Payer: Self-pay | Admitting: Sports Medicine

## 2018-03-15 VITALS — BP 102/64 | HR 66 | Ht 63.0 in | Wt 111.6 lb

## 2018-03-15 DIAGNOSIS — M25561 Pain in right knee: Secondary | ICD-10-CM

## 2018-03-15 DIAGNOSIS — M1711 Unilateral primary osteoarthritis, right knee: Secondary | ICD-10-CM | POA: Diagnosis not present

## 2018-03-15 DIAGNOSIS — G8929 Other chronic pain: Secondary | ICD-10-CM | POA: Diagnosis not present

## 2018-03-15 DIAGNOSIS — J3089 Other allergic rhinitis: Secondary | ICD-10-CM | POA: Diagnosis not present

## 2018-03-15 DIAGNOSIS — M7050 Other bursitis of knee, unspecified knee: Secondary | ICD-10-CM | POA: Diagnosis not present

## 2018-03-15 DIAGNOSIS — J3081 Allergic rhinitis due to animal (cat) (dog) hair and dander: Secondary | ICD-10-CM | POA: Diagnosis not present

## 2018-03-15 DIAGNOSIS — J301 Allergic rhinitis due to pollen: Secondary | ICD-10-CM | POA: Diagnosis not present

## 2018-03-15 NOTE — Progress Notes (Signed)
Jennifer Deleon. Jennifer Deleon, [place] at [facility] [phone]  Jennifer Deleon - 72 y.o. female MRN [id number]  Date of birth: 08/28/1945  Visit Date: 03/15/2018  PCP: [name], NP   Referred by: [name], NP  Scribe(s) for today's visit: Josepha Pigg, CMA  SUBJECTIVE:  Jennifer Deleon is here for Follow-up (R knee pain)  03/01/2018: Her R medial knee pain symptoms INITIALLY: Began about a 1-2 months ago w/ no known MOI.  She states that she enjoys gardening so feels that her knee pain may be due to overuse. Described as moderate sore and aching pain, radiating to R distal h/s Worsened with weight bearing activity, sitting cross-legged, ascending/descending stairs Improved with rest and having her R knee more extended Additional associated symptoms include: slight R knee swelling noted; no mechanical symptoms and no episodes of giving way noted.   At this time symptoms show no change compared to onset  She has been taking Advil and using ice.  03/15/2018: Compared to the last office visit, her previously described symptoms are improving. Sx have flared up slightly since going to the folk festival over the weekend. She denies swelling around the knee. She notes 1 night having trouble sleeping d/t pain.  Current symptoms are moderate & are non-radiating. She c/o soreness on the medial aspect of the knee.  She has been using topical Voltaren gel with some relief.    REVIEW OF SYSTEMS: Reports night time disturbances. Denies fevers, chills, or night sweats. Denies unexplained weight loss. Denies personal history of cancer. Denies changes in bowel or bladder habits. Denies recent unreported falls. Denies new or worsening dyspnea or wheezing. Denies headaches or dizziness.  Denies numbness, tingling or weakness  In the extremities.  Denies dizziness or presyncopal episodes Denies lower extremity edema     HISTORY:  Prior history  reviewed and updated per electronic medical record.  Social History   Occupational History  . Not on file  Tobacco Use  . Smoking status: Never Smoker  . Smokeless tobacco: Never Used  Substance and Sexual Activity  . Alcohol use: Yes    Alcohol/week: 7.0 standard drinks    Types: 7 Glasses of wine per week    Comment: a glass of wine with dinner each night  . Drug use: No  . 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 X-Rays 4 view right knee mild loss of joint space worse in the medial compartment and lateral patellofemoral compartment mild peaking of the tibial spines.   OBJECTIVE:  VS:  HT:5\' 3"  (160 cm)   WT:111 lb 9.6 oz (50.6 kg)  BMI:19.77    BP:102/64  HR:66bpm  TEMP: ( )  RESP:96 %   PHYSICAL EXAM: CONSTITUTIONAL: Well-developed, Well-nourished and In no acute distress Psychiatric: Alert & appropriately interactive. and Not depressed or anxious appearing. RESPIRATORY: No increased work of breathing and Trachea Midline EYES: Pupils are equal., EOM intact without nystagmus. and No scleral icterus.  Lower extremities: EXTREMITY EXAM: Warm and well perfused Edema: No significant swelling or edema NEURO: unremarkable  MSK Exam: Right knee: Marland Kitchen Well aligned, no significant deformity. . No overlying skin changes. Marland Kitchen  TTP significantly improved with only minimal tenderness over the pes bursa. . No focal bony tenderness . Non tender over Medial and lateral joint lines. . Normal, non-painful Flexion and extension, terminal flexion does cause some discomfort localized to the medial knee. . Ligamentously stable to Varus and valgus strain, anterior posterior drawer, normal Lockman's. . Normal, non-painful: McMurray's   ASSESSMENT   1.  Chronic pain of right knee   2. Primary osteoarthritis of right knee   3. Pes anserine bursitis    PLAN:  Pertinent additional documentation may be included in corresponding procedure notes, imaging studies, problem based documentation and patient instructions.  Procedures:  . None  Medications:  No orders of the defined types were placed in this encounter.  Discussion/Instructions: . Overall she is doing significantly better.   . Continue topical NSAIDs as needed and icing . Discussed red flag symptoms that warrant earlier emergent evaluation and patient voices understanding. . Activity modifications and the importance of avoiding exacerbating activities (limiting pain to no more than a 4 / 10 during or following activity) recommended and discussed.  Follow-up:  . Return if symptoms worsen or fail to improve, for as needed for ongoing issues.  . If any lack of improvement consider: . Corticosteroid injection     CMA/ATC served as Education administrator during this visit. History, Physical, and Plan performed by medical provider. Documentation and orders reviewed and attested to.      Gerda Diss, [place] [facility] Physician

## 2018-03-18 DIAGNOSIS — J301 Allergic rhinitis due to pollen: Secondary | ICD-10-CM | POA: Diagnosis not present

## 2018-03-18 DIAGNOSIS — J3081 Allergic rhinitis due to animal (cat) (dog) hair and dander: Secondary | ICD-10-CM | POA: Diagnosis not present

## 2018-03-18 DIAGNOSIS — J3089 Other allergic rhinitis: Secondary | ICD-10-CM | POA: Diagnosis not present

## 2018-03-20 ENCOUNTER — Encounter: Payer: Self-pay | Admitting: Sports Medicine

## 2018-03-22 DIAGNOSIS — J301 Allergic rhinitis due to pollen: Secondary | ICD-10-CM | POA: Diagnosis not present

## 2018-03-22 DIAGNOSIS — J3081 Allergic rhinitis due to animal (cat) (dog) hair and dander: Secondary | ICD-10-CM | POA: Diagnosis not present

## 2018-03-22 DIAGNOSIS — J3089 Other allergic rhinitis: Secondary | ICD-10-CM | POA: Diagnosis not present

## 2018-03-29 DIAGNOSIS — J3081 Allergic rhinitis due to animal (cat) (dog) hair and dander: Secondary | ICD-10-CM | POA: Diagnosis not present

## 2018-03-29 DIAGNOSIS — J3089 Other allergic rhinitis: Secondary | ICD-10-CM | POA: Diagnosis not present

## 2018-03-29 DIAGNOSIS — J301 Allergic rhinitis due to pollen: Secondary | ICD-10-CM | POA: Diagnosis not present

## 2018-04-05 DIAGNOSIS — J3081 Allergic rhinitis due to animal (cat) (dog) hair and dander: Secondary | ICD-10-CM | POA: Diagnosis not present

## 2018-04-05 DIAGNOSIS — J3089 Other allergic rhinitis: Secondary | ICD-10-CM | POA: Diagnosis not present

## 2018-04-05 DIAGNOSIS — J301 Allergic rhinitis due to pollen: Secondary | ICD-10-CM | POA: Diagnosis not present

## 2018-04-13 ENCOUNTER — Other Ambulatory Visit: Payer: Self-pay | Admitting: Adult Health

## 2018-04-16 NOTE — Telephone Encounter (Signed)
#  30 sent to the pharmacy by e-scribe.  Pt is past due for lab work and yearly visit.

## 2018-04-19 DIAGNOSIS — J3089 Other allergic rhinitis: Secondary | ICD-10-CM | POA: Diagnosis not present

## 2018-04-19 DIAGNOSIS — J3081 Allergic rhinitis due to animal (cat) (dog) hair and dander: Secondary | ICD-10-CM | POA: Diagnosis not present

## 2018-04-19 DIAGNOSIS — J301 Allergic rhinitis due to pollen: Secondary | ICD-10-CM | POA: Diagnosis not present

## 2018-04-29 DIAGNOSIS — M5416 Radiculopathy, lumbar region: Secondary | ICD-10-CM | POA: Diagnosis not present

## 2018-05-03 DIAGNOSIS — J3081 Allergic rhinitis due to animal (cat) (dog) hair and dander: Secondary | ICD-10-CM | POA: Diagnosis not present

## 2018-05-03 DIAGNOSIS — J3089 Other allergic rhinitis: Secondary | ICD-10-CM | POA: Diagnosis not present

## 2018-05-03 DIAGNOSIS — J301 Allergic rhinitis due to pollen: Secondary | ICD-10-CM | POA: Diagnosis not present

## 2018-05-07 DIAGNOSIS — F4323 Adjustment disorder with mixed anxiety and depressed mood: Secondary | ICD-10-CM | POA: Diagnosis not present

## 2018-05-10 DIAGNOSIS — M5416 Radiculopathy, lumbar region: Secondary | ICD-10-CM | POA: Diagnosis not present

## 2018-05-17 DIAGNOSIS — J3081 Allergic rhinitis due to animal (cat) (dog) hair and dander: Secondary | ICD-10-CM | POA: Diagnosis not present

## 2018-05-17 DIAGNOSIS — J3089 Other allergic rhinitis: Secondary | ICD-10-CM | POA: Diagnosis not present

## 2018-05-17 DIAGNOSIS — J301 Allergic rhinitis due to pollen: Secondary | ICD-10-CM | POA: Diagnosis not present

## 2018-05-17 DIAGNOSIS — F4322 Adjustment disorder with anxiety: Secondary | ICD-10-CM | POA: Diagnosis not present

## 2018-05-26 DIAGNOSIS — F4323 Adjustment disorder with mixed anxiety and depressed mood: Secondary | ICD-10-CM | POA: Diagnosis not present

## 2018-06-01 ENCOUNTER — Other Ambulatory Visit: Payer: Self-pay | Admitting: Adult Health

## 2018-06-02 NOTE — Telephone Encounter (Signed)
DENIED.  PT IS PAST DUE FOR CPX AND LAB WORK. 

## 2018-06-07 DIAGNOSIS — J3089 Other allergic rhinitis: Secondary | ICD-10-CM | POA: Diagnosis not present

## 2018-06-07 DIAGNOSIS — F4322 Adjustment disorder with anxiety: Secondary | ICD-10-CM | POA: Diagnosis not present

## 2018-06-07 DIAGNOSIS — J301 Allergic rhinitis due to pollen: Secondary | ICD-10-CM | POA: Diagnosis not present

## 2018-06-07 DIAGNOSIS — J3081 Allergic rhinitis due to animal (cat) (dog) hair and dander: Secondary | ICD-10-CM | POA: Diagnosis not present

## 2018-06-10 ENCOUNTER — Ambulatory Visit (INDEPENDENT_AMBULATORY_CARE_PROVIDER_SITE_OTHER): Payer: Medicare Other | Admitting: Adult Health

## 2018-06-10 ENCOUNTER — Encounter: Payer: Self-pay | Admitting: Adult Health

## 2018-06-10 VITALS — BP 96/60 | Temp 98.2°F

## 2018-06-10 DIAGNOSIS — E032 Hypothyroidism due to medicaments and other exogenous substances: Secondary | ICD-10-CM

## 2018-06-10 LAB — TSH: TSH: 5.67 u[IU]/mL — AB (ref 0.35–4.50)

## 2018-06-10 MED ORDER — LEVOTHYROXINE SODIUM 125 MCG PO TABS: 125.0000 ug | ORAL_TABLET | Freq: Every day | ORAL | 3 refills | Status: DC

## 2018-06-10 NOTE — Progress Notes (Signed)
Subjective:    Patient ID: FATE GALANTI, female    DOB: 04-12-1946, 72 y.o.   MRN: [id number]  HPI  72 year old female who  has a past medical history of Arthritis, Asthma, Chicken pox, Colon polyps, Depression, Environmental allergies, Hypothyroid, Osteoporosis, and UTI (lower urinary tract infection).  She presents to the office today for follow up regarding hypothyroidism. She has been currently maintained on Synthroid 125 mcg daily. She takes medications as directed. She denies any symptoms of hypo/hyperthyroidism   She is currently out of synthroid    Review of Systems See HPI   Past Medical History:  Diagnosis Date  . Arthritis   . Asthma   . Chicken pox   . Colon polyps   . Depression   . Environmental allergies    allergies all year long  . Hypothyroid   . Osteoporosis   . UTI (lower urinary tract infection)     Social History   Socioeconomic History  . Marital status: Married    Spouse name: Not on file  . Number of children: Not on file  . Years of education: Not on file  . Highest education level: Not on file  Occupational History  . Not on file  Social Needs  . Financial resource strain: Not on file  . Food insecurity:    Worry: Not on file    Inability: Not on file  . Transportation needs:    Medical: Not on file    Non-medical: Not on file  Tobacco Use  . Smoking status: Never Smoker  . Smokeless tobacco: Never Used  Substance and Sexual Activity  . Alcohol use: Yes    Alcohol/week: 7.0 standard drinks    Types: 7 Glasses of wine per week    Comment: a glass of wine with dinner each night  . Drug use: No  . Sexual activity: Not on file  Lifestyle  . Physical activity:    Days per week: Not on file    Minutes per session: Not on file  . Stress: Not on file  Relationships  . Social connections:    Talks on phone: Not on file    Gets together: Not on file    Attends religious service: Not on file    Active member of club or  organization: Not on file    Attends meetings of clubs or organizations: Not on file    Relationship status: Not on file  . Intimate partner violence:    Fear of current or ex partner: Not on file    Emotionally abused: Not on file    Physically abused: Not on file    Forced sexual activity: Not on file  Other Topics Concern  . Not on file  Social History Narrative   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ighter    Married for  11 years    [place] who live in [place]       Past Surgical History:  Procedure Laterality Date  . CATARACT EXTRACTION Bilateral 2016  . LAMINECTOMY    . LUMBAR FUSION  2015   l4-L5  . TUBAL LIGATION      Family History  Problem Relation Age of Onset  . Osteoarthritis Mother   . Osteoarthritis Maternal Grandmother   . Osteoarthritis Father   . Hypertension Father   . Heart failure Father   . Prostate cancer Paternal Grandfather        mets to colon  . Arthritis Unknown   . Prostate  cancer Unknown   . Mental illness Unknown     Allergies  Allergen Reactions  . Augmentin [Amoxicillin-Pot Clavulanate] Diarrhea and Other (See Comments)    Severe Stomach cramps  . Bactrim [Sulfamethoxazole-Trimethoprim] Swelling    Facial and lip swelling    Current Outpatient Medications on File Prior to Visit  Medication Sig Dispense Refill  . azelastine (ASTELIN) 0.1 % nasal spray Place 1 spray into both nostrils 2 (two) times daily. Use in each nostril as directed    . beclomethasone (QVAR) 40 MCG/ACT inhaler Inhale 2 puffs into the lungs 2 (two) times daily.    Marland Kitchen buPROPion (WELLBUTRIN XL) 300 MG 24 hr tablet Take 300 mg by mouth every morning.    . calcium-vitamin D (OSCAL-500) 500-400 MG-UNIT per tablet Take 1 tablet by mouth 2 (two) times daily with a meal.    . cetirizine (ZYRTEC) 10 MG tablet Take 10 mg by mouth every morning.    . diclofenac sodium (VOLTAREN) 1 % GEL Apply topically to affected area qid 100 g 1  . diphenhydrAMINE (BENADRYL) 25 MG tablet Take 25  mg by mouth every 6 (six) hours as needed for allergies.    Marland Kitchen docusate sodium (COLACE) 100 MG capsule Take 100 mg by mouth 3 times/day as needed-between meals & bedtime for mild constipation.     . DULoxetine (CYMBALTA) 60 MG capsule Take 60 mg by mouth daily.     . fexofenadine (ALLER-EASE) 180 MG tablet Take 180 mg by mouth every morning.    . fluticasone (FLONASE) 50 MCG/ACT nasal spray Place 1 spray into both nostrils 2 (two) times daily.    Marland Kitchen gabapentin (NEURONTIN) 300 MG capsule Take 300 mg 2 (two) times daily by mouth.     . levothyroxine (SYNTHROID, LEVOTHROID) 125 MCG tablet TAKE 1 TABLET DAILY BEFORE BREAKFAST (NEEDS YEARLY VISIT) 30 tablet 0  . montelukast (SINGULAIR) 10 MG tablet Take 10 mg by mouth at bedtime.    . Multiple Vitamin (MULTIVITAMIN WITH MINERALS) TABS Take 1 tablet by mouth every morning.    Marland Kitchen omeprazole (PRILOSEC) 20 MG capsule Take 20 mg by mouth daily.     No current facility-administered medications on file prior to visit.     BP 96/60   Temp 98.2 F (36.8 C)       Objective:   Physical Exam  Constitutional: She is oriented to person, place, and time. She appears well-developed and well-nourished. No distress.  Neck: Normal range of motion. Neck supple.  Cardiovascular: Normal rate, regular rhythm, normal heart sounds and intact distal pulses.  Pulmonary/Chest: Effort normal and breath sounds normal.  Neurological: She is alert and oriented to person, place, and time.  Skin: Skin is warm and dry. Capillary refill takes less than 2 seconds. She is not diaphoretic.  Psychiatric: She has a normal mood and affect. Her behavior is normal. Judgment and thought content normal.  Nursing note and vitals reviewed.     Assessment & Plan:  1. Hypothyroidism due to medication - TSH - levothyroxine (SYNTHROID, LEVOTHROID) 125 MCG tablet; Take 1 tablet (125 mcg total) by mouth daily before breakfast.  Dispense: 90 tablet; Refill: 3  [name], NP

## 2018-06-24 DIAGNOSIS — J3089 Other allergic rhinitis: Secondary | ICD-10-CM | POA: Diagnosis not present

## 2018-06-24 DIAGNOSIS — J3081 Allergic rhinitis due to animal (cat) (dog) hair and dander: Secondary | ICD-10-CM | POA: Diagnosis not present

## 2018-06-24 DIAGNOSIS — J301 Allergic rhinitis due to pollen: Secondary | ICD-10-CM | POA: Diagnosis not present

## 2018-07-08 DIAGNOSIS — H43813 Vitreous degeneration, bilateral: Secondary | ICD-10-CM | POA: Diagnosis not present

## 2018-07-08 DIAGNOSIS — H524 Presbyopia: Secondary | ICD-10-CM | POA: Diagnosis not present

## 2018-07-08 DIAGNOSIS — J3089 Other allergic rhinitis: Secondary | ICD-10-CM | POA: Diagnosis not present

## 2018-07-08 DIAGNOSIS — J301 Allergic rhinitis due to pollen: Secondary | ICD-10-CM | POA: Diagnosis not present

## 2018-07-08 DIAGNOSIS — J3081 Allergic rhinitis due to animal (cat) (dog) hair and dander: Secondary | ICD-10-CM | POA: Diagnosis not present

## 2018-07-19 DIAGNOSIS — J301 Allergic rhinitis due to pollen: Secondary | ICD-10-CM | POA: Diagnosis not present

## 2018-07-19 DIAGNOSIS — H1045 Other chronic allergic conjunctivitis: Secondary | ICD-10-CM | POA: Diagnosis not present

## 2018-07-19 DIAGNOSIS — J3081 Allergic rhinitis due to animal (cat) (dog) hair and dander: Secondary | ICD-10-CM | POA: Diagnosis not present

## 2018-07-19 DIAGNOSIS — J3089 Other allergic rhinitis: Secondary | ICD-10-CM | POA: Diagnosis not present

## 2018-07-30 DIAGNOSIS — J3081 Allergic rhinitis due to animal (cat) (dog) hair and dander: Secondary | ICD-10-CM | POA: Diagnosis not present

## 2018-07-30 DIAGNOSIS — J301 Allergic rhinitis due to pollen: Secondary | ICD-10-CM | POA: Diagnosis not present

## 2018-07-30 DIAGNOSIS — J3089 Other allergic rhinitis: Secondary | ICD-10-CM | POA: Diagnosis not present

## 2018-08-13 DIAGNOSIS — J301 Allergic rhinitis due to pollen: Secondary | ICD-10-CM | POA: Diagnosis not present

## 2018-08-13 DIAGNOSIS — J3089 Other allergic rhinitis: Secondary | ICD-10-CM | POA: Diagnosis not present

## 2018-08-13 DIAGNOSIS — J3081 Allergic rhinitis due to animal (cat) (dog) hair and dander: Secondary | ICD-10-CM | POA: Diagnosis not present

## 2018-08-27 DIAGNOSIS — J3089 Other allergic rhinitis: Secondary | ICD-10-CM | POA: Diagnosis not present

## 2018-08-27 DIAGNOSIS — J3081 Allergic rhinitis due to animal (cat) (dog) hair and dander: Secondary | ICD-10-CM | POA: Diagnosis not present

## 2018-08-27 DIAGNOSIS — J301 Allergic rhinitis due to pollen: Secondary | ICD-10-CM | POA: Diagnosis not present

## 2018-09-10 DIAGNOSIS — J3089 Other allergic rhinitis: Secondary | ICD-10-CM | POA: Diagnosis not present

## 2018-09-10 DIAGNOSIS — J3081 Allergic rhinitis due to animal (cat) (dog) hair and dander: Secondary | ICD-10-CM | POA: Diagnosis not present

## 2018-09-10 DIAGNOSIS — J301 Allergic rhinitis due to pollen: Secondary | ICD-10-CM | POA: Diagnosis not present

## 2018-09-23 DIAGNOSIS — J3089 Other allergic rhinitis: Secondary | ICD-10-CM | POA: Diagnosis not present

## 2018-09-23 DIAGNOSIS — J301 Allergic rhinitis due to pollen: Secondary | ICD-10-CM | POA: Diagnosis not present

## 2018-09-23 DIAGNOSIS — J3081 Allergic rhinitis due to animal (cat) (dog) hair and dander: Secondary | ICD-10-CM | POA: Diagnosis not present

## 2018-10-21 DIAGNOSIS — J3089 Other allergic rhinitis: Secondary | ICD-10-CM | POA: Diagnosis not present

## 2018-10-21 DIAGNOSIS — J3081 Allergic rhinitis due to animal (cat) (dog) hair and dander: Secondary | ICD-10-CM | POA: Diagnosis not present

## 2018-10-21 DIAGNOSIS — J301 Allergic rhinitis due to pollen: Secondary | ICD-10-CM | POA: Diagnosis not present

## 2018-11-15 ENCOUNTER — Other Ambulatory Visit: Payer: Self-pay

## 2018-11-15 ENCOUNTER — Ambulatory Visit (INDEPENDENT_AMBULATORY_CARE_PROVIDER_SITE_OTHER): Payer: Medicare Other | Admitting: Family Medicine

## 2018-11-15 ENCOUNTER — Telehealth: Payer: Self-pay | Admitting: Adult Health

## 2018-11-15 ENCOUNTER — Encounter: Payer: Self-pay | Admitting: Family Medicine

## 2018-11-15 DIAGNOSIS — K120 Recurrent oral aphthae: Secondary | ICD-10-CM | POA: Diagnosis not present

## 2018-11-15 MED ORDER — MAGIC MOUTHWASH: 15.0000 mL | Freq: Four times a day (QID) | ORAL | 1 refills | Status: DC | PRN

## 2018-11-15 NOTE — Progress Notes (Signed)
Patient ID: Jennifer Deleon, female   DOB: 17-Aug-1945, 73 y.o.   MRN: [id number]  This visit type was conducted due to national recommendations for restrictions regarding the COVID-19 pandemic in an effort to limit this patient's exposure and mitigate transmission in our community.   Virtual Visit via Video Note  I connected with Jennifer Deleon on 11/15/18 at  1:15 PM EDT by a video enabled telemedicine application and verified that I am speaking with the correct person using two identifiers.  Location patient: home Location provider:work or home office Persons participating in the virtual visit: patient, provider  I discussed the limitations of evaluation and management by telemedicine and the availability of in person appointments. The patient expressed understanding and agreed to proceed.   HPI: Patient states she has had 2 weeks of sore throat.  She now has noticed some erythematous spots in her mouth posterior pharynx and hard and soft palate region.  She has some pain with swallowing.  No fevers or chills.  She tried salt water gargles without much improvement.  She tried over-the-counter mouthwash that had menthol and hydrogen peroxide and this irritated her symptoms worse.  No recent antibiotics.  No history of thrush.  No steroid inhalers.  Appetite is stable.   ROS: See pertinent positives and negatives per HPI.  Past Medical History:  Diagnosis Date  . Arthritis   . Asthma   . Chicken pox   . Colon polyps   . Depression   . Environmental allergies    allergies all year long  . Hypothyroid   . Osteoporosis   . UTI (lower urinary tract infection)     Past Surgical History:  Procedure Laterality Date  . CATARACT EXTRACTION Bilateral 2016  . LAMINECTOMY    . LUMBAR FUSION  2015   l4-L5  . TUBAL LIGATION      Family History  Problem Relation Age of Onset  . Osteoarthritis Mother   . Osteoarthritis Maternal Grandmother   . Osteoarthritis Father   . Hypertension Father    . Heart failure Father   . Prostate cancer Paternal Grandfather        mets to colon  . Arthritis Unknown   . Prostate cancer Unknown   . Mental illness Unknown     SOCIAL HX: Non-smoker   Current Outpatient Medications:  .  azelastine (ASTELIN) 0.1 % nasal spray, Place 1 spray into both nostrils 2 (two) times daily. Use in each nostril as directed, Disp: , Rfl:  .  beclomethasone (QVAR) 40 MCG/ACT inhaler, Inhale 2 puffs into the lungs 2 (two) times daily., Disp: , Rfl:  .  buPROPion (WELLBUTRIN XL) 300 MG 24 hr tablet, Take 300 mg by mouth every morning., Disp: , Rfl:  .  calcium-vitamin D (OSCAL-500) 500-400 MG-UNIT per tablet, Take 1 tablet by mouth 2 (two) times daily with a meal., Disp: , Rfl:  .  cetirizine (ZYRTEC) 10 MG tablet, Take 10 mg by mouth every morning., Disp: , Rfl:  .  diclofenac sodium (VOLTAREN) 1 % GEL, Apply topically to affected area qid, Disp: 100 g, Rfl: 1 .  diphenhydrAMINE (BENADRYL) 25 MG tablet, Take 25 mg by mouth every 6 (six) hours as needed for allergies., Disp: , Rfl:  .  docusate sodium (COLACE) 100 MG capsule, Take 100 mg by mouth 3 times/day as needed-between meals & bedtime for mild constipation. , Disp: , Rfl:  .  DULoxetine (CYMBALTA) 60 MG capsule, Take 60 mg by mouth daily. , Disp: ,  Rfl:  .  fexofenadine (ALLER-EASE) 180 MG tablet, Take 180 mg by mouth every morning., Disp: , Rfl:  .  fluticasone (FLONASE) 50 MCG/ACT nasal spray, Place 1 spray into both nostrils 2 (two) times daily., Disp: , Rfl:  .  gabapentin (NEURONTIN) 300 MG capsule, Take 300 mg 2 (two) times daily by mouth. , Disp: , Rfl:  .  levothyroxine (SYNTHROID, LEVOTHROID) 125 MCG tablet, Take 1 tablet (125 mcg total) by mouth daily before breakfast., Disp: 90 tablet, Rfl: 3 .  montelukast (SINGULAIR) 10 MG tablet, Take 10 mg by mouth at bedtime., Disp: , Rfl:  .  Multiple Vitamin (MULTIVITAMIN WITH MINERALS) TABS, Take 1 tablet by mouth every morning., Disp: , Rfl:  .  omeprazole  (PRILOSEC) 20 MG capsule, Take 20 mg by mouth daily., Disp: , Rfl:   EXAM:  VITALS per patient if applicable:  GENERAL: alert, oriented, appears well and in no acute distress  HEENT: atraumatic, conjunttiva clear, no obvious abnormalities on inspection of external nose and ears  NECK: normal movements of the head and neck  LUNGS: on inspection no signs of respiratory distress, breathing rate appears normal, no obvious gross SOB, gasping or wheezing  CV: no obvious cyanosis  MS: moves all visible extremities without noticeable abnormality  PSYCH/NEURO: pleasant and cooperative, no obvious depression or anxiety, speech and thought processing grossly intact  ASSESSMENT AND PLAN:  Discussed the following assessment and plan:  Aphthous ulcers     I discussed the assessment and treatment plan with the patient. The patient was provided an opportunity to ask questions and all were answered. The patient agreed with the plan and demonstrated an understanding of the instructions.   The patient was advised to call back or seek an in-person evaluation if the symptoms worsen or if the condition fails to improve as anticipated.     Carolann Littler, MD

## 2018-11-15 NOTE — Telephone Encounter (Signed)
Copied from [place] [id number]. Topic: Quick Communication - See Telephone Encounter >> Nov 15, 2018  2:25 PM Vernona Rieger wrote: CRM for notification. See Telephone encounter for: 11/15/18.  Kristopher Oppenheim called and said they do not do compounds anymore for the script magic mouthwash SOLN. She said that she knows [facility] does. Please Advise.

## 2018-11-16 NOTE — Telephone Encounter (Signed)
Pt calling to check status. Please advise  °

## 2018-11-16 NOTE — Telephone Encounter (Signed)
Jennifer Deleon at [phone] and spoke with Jennifer Deleon and gave her the Magic Mouth wash solution, swish and spit Rx. Jennifer Deleon stated that she would call patient to go over Rx.  Called patient and left a voice message to let her know that Jennifer Deleon was not able to fill this Rx and that I have sent to the pharmacy.

## 2018-11-17 DIAGNOSIS — J3089 Other allergic rhinitis: Secondary | ICD-10-CM | POA: Diagnosis not present

## 2018-11-17 DIAGNOSIS — J3081 Allergic rhinitis due to animal (cat) (dog) hair and dander: Secondary | ICD-10-CM | POA: Diagnosis not present

## 2018-11-17 DIAGNOSIS — J301 Allergic rhinitis due to pollen: Secondary | ICD-10-CM | POA: Diagnosis not present

## 2018-12-09 DIAGNOSIS — J301 Allergic rhinitis due to pollen: Secondary | ICD-10-CM | POA: Diagnosis not present

## 2018-12-09 DIAGNOSIS — J3089 Other allergic rhinitis: Secondary | ICD-10-CM | POA: Diagnosis not present

## 2018-12-09 DIAGNOSIS — J3081 Allergic rhinitis due to animal (cat) (dog) hair and dander: Secondary | ICD-10-CM | POA: Diagnosis not present

## 2018-12-23 DIAGNOSIS — M5416 Radiculopathy, lumbar region: Secondary | ICD-10-CM | POA: Diagnosis not present

## 2018-12-23 DIAGNOSIS — M48061 Spinal stenosis, lumbar region without neurogenic claudication: Secondary | ICD-10-CM | POA: Diagnosis not present

## 2018-12-24 DIAGNOSIS — J301 Allergic rhinitis due to pollen: Secondary | ICD-10-CM | POA: Diagnosis not present

## 2018-12-24 DIAGNOSIS — J3089 Other allergic rhinitis: Secondary | ICD-10-CM | POA: Diagnosis not present

## 2018-12-24 DIAGNOSIS — J3081 Allergic rhinitis due to animal (cat) (dog) hair and dander: Secondary | ICD-10-CM | POA: Diagnosis not present

## 2018-12-28 DIAGNOSIS — M5416 Radiculopathy, lumbar region: Secondary | ICD-10-CM | POA: Diagnosis not present

## 2019-01-13 DIAGNOSIS — J3089 Other allergic rhinitis: Secondary | ICD-10-CM | POA: Diagnosis not present

## 2019-01-13 DIAGNOSIS — J3081 Allergic rhinitis due to animal (cat) (dog) hair and dander: Secondary | ICD-10-CM | POA: Diagnosis not present

## 2019-01-13 DIAGNOSIS — J301 Allergic rhinitis due to pollen: Secondary | ICD-10-CM | POA: Diagnosis not present

## 2019-01-14 DIAGNOSIS — J3081 Allergic rhinitis due to animal (cat) (dog) hair and dander: Secondary | ICD-10-CM | POA: Diagnosis not present

## 2019-01-14 DIAGNOSIS — J301 Allergic rhinitis due to pollen: Secondary | ICD-10-CM | POA: Diagnosis not present

## 2019-01-14 DIAGNOSIS — J3089 Other allergic rhinitis: Secondary | ICD-10-CM | POA: Diagnosis not present

## 2019-02-02 DIAGNOSIS — J3081 Allergic rhinitis due to animal (cat) (dog) hair and dander: Secondary | ICD-10-CM | POA: Diagnosis not present

## 2019-02-02 DIAGNOSIS — J301 Allergic rhinitis due to pollen: Secondary | ICD-10-CM | POA: Diagnosis not present

## 2019-02-02 DIAGNOSIS — J3089 Other allergic rhinitis: Secondary | ICD-10-CM | POA: Diagnosis not present

## 2019-02-07 ENCOUNTER — Other Ambulatory Visit: Payer: Self-pay

## 2019-02-23 DIAGNOSIS — J301 Allergic rhinitis due to pollen: Secondary | ICD-10-CM | POA: Diagnosis not present

## 2019-02-23 DIAGNOSIS — J3089 Other allergic rhinitis: Secondary | ICD-10-CM | POA: Diagnosis not present

## 2019-02-23 DIAGNOSIS — J3081 Allergic rhinitis due to animal (cat) (dog) hair and dander: Secondary | ICD-10-CM | POA: Diagnosis not present

## 2019-02-25 DIAGNOSIS — J3081 Allergic rhinitis due to animal (cat) (dog) hair and dander: Secondary | ICD-10-CM | POA: Diagnosis not present

## 2019-02-25 DIAGNOSIS — J3089 Other allergic rhinitis: Secondary | ICD-10-CM | POA: Diagnosis not present

## 2019-02-25 DIAGNOSIS — J301 Allergic rhinitis due to pollen: Secondary | ICD-10-CM | POA: Diagnosis not present

## 2019-02-28 DIAGNOSIS — J3089 Other allergic rhinitis: Secondary | ICD-10-CM | POA: Diagnosis not present

## 2019-02-28 DIAGNOSIS — J301 Allergic rhinitis due to pollen: Secondary | ICD-10-CM | POA: Diagnosis not present

## 2019-02-28 DIAGNOSIS — J3081 Allergic rhinitis due to animal (cat) (dog) hair and dander: Secondary | ICD-10-CM | POA: Diagnosis not present

## 2019-03-03 DIAGNOSIS — J301 Allergic rhinitis due to pollen: Secondary | ICD-10-CM | POA: Diagnosis not present

## 2019-03-03 DIAGNOSIS — J3089 Other allergic rhinitis: Secondary | ICD-10-CM | POA: Diagnosis not present

## 2019-03-03 DIAGNOSIS — J3081 Allergic rhinitis due to animal (cat) (dog) hair and dander: Secondary | ICD-10-CM | POA: Diagnosis not present

## 2019-03-10 DIAGNOSIS — J3081 Allergic rhinitis due to animal (cat) (dog) hair and dander: Secondary | ICD-10-CM | POA: Diagnosis not present

## 2019-03-10 DIAGNOSIS — J3089 Other allergic rhinitis: Secondary | ICD-10-CM | POA: Diagnosis not present

## 2019-03-10 DIAGNOSIS — J301 Allergic rhinitis due to pollen: Secondary | ICD-10-CM | POA: Diagnosis not present

## 2019-03-18 DIAGNOSIS — M4316 Spondylolisthesis, lumbar region: Secondary | ICD-10-CM | POA: Diagnosis not present

## 2019-03-18 DIAGNOSIS — M5116 Intervertebral disc disorders with radiculopathy, lumbar region: Secondary | ICD-10-CM | POA: Diagnosis not present

## 2019-03-18 DIAGNOSIS — M5416 Radiculopathy, lumbar region: Secondary | ICD-10-CM | POA: Diagnosis not present

## 2019-03-18 DIAGNOSIS — M48062 Spinal stenosis, lumbar region with neurogenic claudication: Secondary | ICD-10-CM | POA: Diagnosis not present

## 2019-03-24 DIAGNOSIS — J3089 Other allergic rhinitis: Secondary | ICD-10-CM | POA: Diagnosis not present

## 2019-03-24 DIAGNOSIS — J3081 Allergic rhinitis due to animal (cat) (dog) hair and dander: Secondary | ICD-10-CM | POA: Diagnosis not present

## 2019-03-24 DIAGNOSIS — J301 Allergic rhinitis due to pollen: Secondary | ICD-10-CM | POA: Diagnosis not present

## 2019-04-07 DIAGNOSIS — J3089 Other allergic rhinitis: Secondary | ICD-10-CM | POA: Diagnosis not present

## 2019-04-07 DIAGNOSIS — J301 Allergic rhinitis due to pollen: Secondary | ICD-10-CM | POA: Diagnosis not present

## 2019-04-07 DIAGNOSIS — J3081 Allergic rhinitis due to animal (cat) (dog) hair and dander: Secondary | ICD-10-CM | POA: Diagnosis not present

## 2019-04-28 DIAGNOSIS — J301 Allergic rhinitis due to pollen: Secondary | ICD-10-CM | POA: Diagnosis not present

## 2019-04-28 DIAGNOSIS — J3081 Allergic rhinitis due to animal (cat) (dog) hair and dander: Secondary | ICD-10-CM | POA: Diagnosis not present

## 2019-04-28 DIAGNOSIS — J3089 Other allergic rhinitis: Secondary | ICD-10-CM | POA: Diagnosis not present

## 2019-05-11 ENCOUNTER — Encounter: Payer: Self-pay | Admitting: Adult Health

## 2019-05-11 ENCOUNTER — Other Ambulatory Visit: Payer: Self-pay | Admitting: Adult Health

## 2019-05-11 ENCOUNTER — Ambulatory Visit (INDEPENDENT_AMBULATORY_CARE_PROVIDER_SITE_OTHER): Payer: Medicare Other | Admitting: Adult Health

## 2019-05-11 ENCOUNTER — Other Ambulatory Visit: Payer: Self-pay

## 2019-05-11 VITALS — BP 120/72 | HR 66 | Temp 98.3°F | Wt 115.0 lb

## 2019-05-11 DIAGNOSIS — E032 Hypothyroidism due to medicaments and other exogenous substances: Secondary | ICD-10-CM

## 2019-05-11 DIAGNOSIS — Z23 Encounter for immunization: Secondary | ICD-10-CM | POA: Diagnosis not present

## 2019-05-11 LAB — TSH: TSH: 0.05 u[IU]/mL — ABNORMAL LOW (ref 0.35–4.50)

## 2019-05-11 NOTE — Patient Instructions (Addendum)
It was great seeing you today   Please schedule your physical at the front desk   I will follow up with you regarding the thyroid lab work

## 2019-05-11 NOTE — Progress Notes (Signed)
Subjective:    Patient ID: JHOURNEE MURDOCH, female    DOB: 1945/09/02, 73 y.o.   MRN: [id number]  HPI 73 year old female who  has a past medical history of Arthritis, Asthma, Chicken pox, Colon polyps, Depression, Environmental allergies, Hypothyroid, Osteoporosis, and UTI (lower urinary tract infection).  She presents to the office today for follow up today for medication renewal. She needs her synthroid refilled. She is currently prescribed synthroid 125 mcg. She denies symptoms of hypo or hyperthyroidism.   She has no acute complaints today    Review of Systems See HPI   Past Medical History:  Diagnosis Date  . Arthritis   . Asthma   . Chicken pox   . Colon polyps   . Depression   . Environmental allergies    allergies all year long  . Hypothyroid   . Osteoporosis   . UTI (lower urinary tract infection)     Social History   Socioeconomic History  . Marital status: Married    Spouse name: Not on file  . Number of children: Not on file  . Years of education: Not on file  . Highest education level: Not on file  Occupational History  . Not on file  Social Needs  . Financial resource strain: Not on file  . Food insecurity    Worry: Not on file    Inability: Not on file  . Transportation needs    Medical: Not on file    Non-medical: Not on file  Tobacco Use  . Smoking status: Never Smoker  . Smokeless tobacco: Never Used  Substance and Sexual Activity  . Alcohol use: Yes    Alcohol/week: 7.0 standard drinks    Types: 7 Glasses of wine per week    Comment: a glass of wine with dinner each night  . Drug use: No  . Sexual activity: Not on file  Lifestyle  . Physical activity    Days per week: Not on file    Minutes per session: Not on file  . Stress: Not on file  Relationships  . Social Herbalist on phone: Not on file    Gets together: Not on file    Attends religious service: Not on file    Active member of club or organization: Not on file   Attends meetings of clubs or organizations: Not on file    Relationship status: Not on file  . Intimate partner violence    Fear of current or ex partner: Not on file    Emotionally abused: Not on file    Physically abused: Not on file    Forced sexual activity: Not on file  Other Topics Concern  . Not on file  Social History Narrative   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ighter    Married for  11 years    [place] who live in [place]       Past Surgical History:  Procedure Laterality Date  . CATARACT EXTRACTION Bilateral 2016  . LAMINECTOMY    . LUMBAR FUSION  2015   l4-L5  . TUBAL LIGATION      Family History  Problem Relation Age of Onset  . Osteoarthritis Mother   . Osteoarthritis Maternal Grandmother   . Osteoarthritis Father   . Hypertension Father   . Heart failure Father   . Prostate cancer Paternal Grandfather        mets to colon  . Arthritis Unknown   . Prostate cancer Unknown   .  Mental illness Unknown     Allergies  Allergen Reactions  . Augmentin [Amoxicillin-Pot Clavulanate] Diarrhea and Other (See Comments)    Severe Stomach cramps  . Bactrim [Sulfamethoxazole-Trimethoprim] Swelling    Facial and lip swelling    Current Outpatient Medications on File Prior to Visit  Medication Sig Dispense Refill  . azelastine (ASTELIN) 0.1 % nasal spray Place 1 spray into both nostrils 2 (two) times daily. Use in each nostril as directed    . beclomethasone (QVAR) 40 MCG/ACT inhaler Inhale 2 puffs into the lungs 2 (two) times daily.    Marland Kitchen buPROPion (WELLBUTRIN XL) 300 MG 24 hr tablet Take 300 mg by mouth every morning.    . calcium-vitamin D (OSCAL-500) 500-400 MG-UNIT per tablet Take 1 tablet by mouth 2 (two) times daily with a meal.    . cetirizine (ZYRTEC) 10 MG tablet Take 10 mg by mouth every morning.    . diclofenac sodium (VOLTAREN) 1 % GEL Apply topically to affected area qid 100 g 1  . diphenhydrAMINE (BENADRYL) 25 MG tablet Take 25 mg by mouth every 6 (six) hours  as needed for allergies.    Marland Kitchen docusate sodium (COLACE) 100 MG capsule Take 100 mg by mouth 3 times/day as needed-between meals & bedtime for mild constipation.     . DULoxetine (CYMBALTA) 60 MG capsule Take 60 mg by mouth daily.     . fexofenadine (ALLER-EASE) 180 MG tablet Take 180 mg by mouth every morning.    . fluticasone (FLONASE) 50 MCG/ACT nasal spray Place 1 spray into both nostrils 2 (two) times daily.    Marland Kitchen levothyroxine (SYNTHROID, LEVOTHROID) 125 MCG tablet Take 1 tablet (125 mcg total) by mouth daily before breakfast. 90 tablet 3  . montelukast (SINGULAIR) 10 MG tablet Take 10 mg by mouth at bedtime.    . Multiple Vitamin (MULTIVITAMIN WITH MINERALS) TABS Take 1 tablet by mouth every morning.    Marland Kitchen omeprazole (PRILOSEC) 20 MG capsule Take 20 mg by mouth daily.    Marland Kitchen gabapentin (NEURONTIN) 300 MG capsule Take 300 mg 2 (two) times daily by mouth.     . magic mouthwash SOLN Take 15 mLs by mouth 4 (four) times daily as needed for mouth pain. 240 mL 1   No current facility-administered medications on file prior to visit.     BP 120/72 (BP Location: Left Arm, Patient Position: Sitting, Cuff Size: Normal)   Pulse 66   Temp 98.3 F (36.8 C) (Temporal)   Wt 115 lb (52.2 kg)   SpO2 97%   BMI 20.37 kg/m       Objective:   Physical Exam Vitals signs and nursing note reviewed.  Constitutional:      Appearance: Normal appearance.  Neck:     Thyroid: No thyroid mass, thyromegaly or thyroid tenderness.  Cardiovascular:     Rate and Rhythm: Normal rate and regular rhythm.     Pulses: Normal pulses.     Heart sounds: Normal heart sounds.  Pulmonary:     Effort: Pulmonary effort is normal.     Breath sounds: Normal breath sounds.  Musculoskeletal: Normal range of motion.  Skin:    General: Skin is warm and dry.     Capillary Refill: Capillary refill takes less than 2 seconds.  Neurological:     General: No focal deficit present.     Mental Status: She is alert and oriented to  person, place, and time.       Assessment & Plan:  1. Hypothyroidism due to medication - Consider increasing synthroid  - TSH  2. Need for influenza vaccination  - Flu Vaccine QUAD High Dose(Fluad)   [name], NP

## 2019-05-11 NOTE — Telephone Encounter (Signed)
DENIED.  FILLED ON 06/10/2018 FOR 1 YEAR.  REQUEST IS EARLY.

## 2019-05-17 ENCOUNTER — Other Ambulatory Visit: Payer: Self-pay | Admitting: Family Medicine

## 2019-05-17 MED ORDER — LEVOTHYROXINE SODIUM 112 MCG PO TABS: 112.0000 ug | ORAL_TABLET | Freq: Every day | ORAL | 1 refills | Status: DC

## 2019-05-18 DIAGNOSIS — M4316 Spondylolisthesis, lumbar region: Secondary | ICD-10-CM | POA: Diagnosis not present

## 2019-05-18 DIAGNOSIS — M5116 Intervertebral disc disorders with radiculopathy, lumbar region: Secondary | ICD-10-CM | POA: Diagnosis not present

## 2019-05-18 DIAGNOSIS — M48062 Spinal stenosis, lumbar region with neurogenic claudication: Secondary | ICD-10-CM | POA: Diagnosis not present

## 2019-05-18 DIAGNOSIS — M5416 Radiculopathy, lumbar region: Secondary | ICD-10-CM | POA: Diagnosis not present

## 2019-05-24 DIAGNOSIS — J3089 Other allergic rhinitis: Secondary | ICD-10-CM | POA: Diagnosis not present

## 2019-05-24 DIAGNOSIS — J301 Allergic rhinitis due to pollen: Secondary | ICD-10-CM | POA: Diagnosis not present

## 2019-05-24 DIAGNOSIS — J3081 Allergic rhinitis due to animal (cat) (dog) hair and dander: Secondary | ICD-10-CM | POA: Diagnosis not present

## 2019-06-10 DIAGNOSIS — J3081 Allergic rhinitis due to animal (cat) (dog) hair and dander: Secondary | ICD-10-CM | POA: Diagnosis not present

## 2019-06-10 DIAGNOSIS — J3089 Other allergic rhinitis: Secondary | ICD-10-CM | POA: Diagnosis not present

## 2019-06-10 DIAGNOSIS — J301 Allergic rhinitis due to pollen: Secondary | ICD-10-CM | POA: Diagnosis not present

## 2019-06-15 ENCOUNTER — Other Ambulatory Visit: Payer: Self-pay

## 2019-06-15 ENCOUNTER — Other Ambulatory Visit: Payer: Medicare Other

## 2019-06-15 ENCOUNTER — Other Ambulatory Visit (INDEPENDENT_AMBULATORY_CARE_PROVIDER_SITE_OTHER): Payer: Medicare Other

## 2019-06-15 DIAGNOSIS — E032 Hypothyroidism due to medicaments and other exogenous substances: Secondary | ICD-10-CM | POA: Diagnosis not present

## 2019-06-15 LAB — TSH: TSH: 0.53 u[IU]/mL (ref 0.35–4.50)

## 2019-06-17 ENCOUNTER — Other Ambulatory Visit: Payer: Self-pay | Admitting: Family Medicine

## 2019-06-17 MED ORDER — LEVOTHYROXINE SODIUM 112 MCG PO TABS: 112.0000 ug | ORAL_TABLET | Freq: Every day | ORAL | 3 refills | Status: DC

## 2019-06-17 NOTE — Telephone Encounter (Signed)
Sent to the pharmacy by e-scribe. 

## 2019-06-30 ENCOUNTER — Inpatient Hospital Stay (HOSPITAL_COMMUNITY)
Admission: EM | Admit: 2019-06-30 | Discharge: 2019-07-02 | DRG: 089 | Disposition: A | Discharge status: Home / Self Care | Payer: Medicare Other | Attending: Family Medicine | Admitting: Family Medicine

## 2019-06-30 ENCOUNTER — Other Ambulatory Visit: Payer: Self-pay

## 2019-06-30 ENCOUNTER — Emergency Department (HOSPITAL_COMMUNITY): Payer: Medicare Other

## 2019-06-30 ENCOUNTER — Encounter (HOSPITAL_COMMUNITY): Payer: Self-pay | Admitting: Emergency Medicine

## 2019-06-30 DIAGNOSIS — S0181XA Laceration without foreign body of other part of head, initial encounter: Secondary | ICD-10-CM | POA: Diagnosis not present

## 2019-06-30 DIAGNOSIS — Z881 Allergy status to other antibiotic agents status: Secondary | ICD-10-CM

## 2019-06-30 DIAGNOSIS — S02609A Fracture of mandible, unspecified, initial encounter for closed fracture: Secondary | ICD-10-CM | POA: Diagnosis present

## 2019-06-30 DIAGNOSIS — S01511A Laceration without foreign body of lip, initial encounter: Secondary | ICD-10-CM | POA: Diagnosis present

## 2019-06-30 DIAGNOSIS — S02611A Fracture of condylar process of right mandible, initial encounter for closed fracture: Secondary | ICD-10-CM | POA: Diagnosis not present

## 2019-06-30 DIAGNOSIS — R0789 Other chest pain: Secondary | ICD-10-CM

## 2019-06-30 DIAGNOSIS — S2242XA Multiple fractures of ribs, left side, initial encounter for closed fracture: Secondary | ICD-10-CM | POA: Diagnosis not present

## 2019-06-30 DIAGNOSIS — M1990 Unspecified osteoarthritis, unspecified site: Secondary | ICD-10-CM | POA: Diagnosis present

## 2019-06-30 DIAGNOSIS — R55 Syncope and collapse: Secondary | ICD-10-CM

## 2019-06-30 DIAGNOSIS — Y92018 Other place in single-family (private) house as the place of occurrence of the external cause: Secondary | ICD-10-CM

## 2019-06-30 DIAGNOSIS — S0240CA Maxillary fracture, right side, initial encounter for closed fracture: Secondary | ICD-10-CM | POA: Diagnosis not present

## 2019-06-30 DIAGNOSIS — F329 Major depressive disorder, single episode, unspecified: Secondary | ICD-10-CM | POA: Diagnosis present

## 2019-06-30 DIAGNOSIS — Z79890 Hormone replacement therapy (postmenopausal): Secondary | ICD-10-CM

## 2019-06-30 DIAGNOSIS — Z79899 Other long term (current) drug therapy: Secondary | ICD-10-CM

## 2019-06-30 DIAGNOSIS — S060X9A Concussion with loss of consciousness of unspecified duration, initial encounter: Secondary | ICD-10-CM | POA: Diagnosis not present

## 2019-06-30 DIAGNOSIS — E039 Hypothyroidism, unspecified: Secondary | ICD-10-CM | POA: Diagnosis present

## 2019-06-30 DIAGNOSIS — S199XXA Unspecified injury of neck, initial encounter: Secondary | ICD-10-CM | POA: Diagnosis not present

## 2019-06-30 DIAGNOSIS — Z8261 Family history of arthritis: Secondary | ICD-10-CM

## 2019-06-30 DIAGNOSIS — S2249XA Multiple fractures of ribs, unspecified side, initial encounter for closed fracture: Secondary | ICD-10-CM | POA: Diagnosis present

## 2019-06-30 DIAGNOSIS — W1830XA Fall on same level, unspecified, initial encounter: Secondary | ICD-10-CM | POA: Diagnosis present

## 2019-06-30 DIAGNOSIS — S2239XA Fracture of one rib, unspecified side, initial encounter for closed fracture: Secondary | ICD-10-CM | POA: Diagnosis present

## 2019-06-30 DIAGNOSIS — M810 Age-related osteoporosis without current pathological fracture: Secondary | ICD-10-CM | POA: Diagnosis present

## 2019-06-30 DIAGNOSIS — Z8249 Family history of ischemic heart disease and other diseases of the circulatory system: Secondary | ICD-10-CM

## 2019-06-30 DIAGNOSIS — Z20828 Contact with and (suspected) exposure to other viral communicable diseases: Secondary | ICD-10-CM | POA: Diagnosis present

## 2019-06-30 DIAGNOSIS — J45909 Unspecified asthma, uncomplicated: Secondary | ICD-10-CM | POA: Diagnosis present

## 2019-06-30 DIAGNOSIS — Z981 Arthrodesis status: Secondary | ICD-10-CM

## 2019-06-30 DIAGNOSIS — S0990XA Unspecified injury of head, initial encounter: Secondary | ICD-10-CM

## 2019-06-30 LAB — COMPREHENSIVE METABOLIC PANEL
ALT: 26 U/L (ref 0–44)
AST: 49 U/L — ABNORMAL HIGH (ref 15–41)
Albumin: 4.4 g/dL (ref 3.5–5.0)
Alkaline Phosphatase: 65 U/L (ref 38–126)
Anion gap: 11 (ref 5–15)
BUN: 17 mg/dL (ref 8–23)
CO2: 22 mmol/L (ref 22–32)
Calcium: 9.7 mg/dL (ref 8.9–10.3)
Chloride: 105 mmol/L (ref 98–111)
Creatinine, Ser: 0.93 mg/dL (ref 0.44–1.00)
GFR calc Af Amer: >60 mL/min (ref >60)
GFR calc non Af Amer: >60 mL/min (ref >60)
Glucose, Bld: 121 mg/dL — ABNORMAL HIGH (ref 70–99)
Potassium: 3.6 mmol/L (ref 3.5–5.1)
Sodium: 138 mmol/L (ref 135–145)
Total Bilirubin: 0.3 mg/dL (ref 0.3–1.2)
Total Protein: 6.8 g/dL (ref 6.5–8.1)

## 2019-06-30 LAB — CBC WITH DIFFERENTIAL/PLATELET
Abs Immature Granulocytes: 0.04 10*3/uL (ref 0.00–0.07)
Basophils Absolute: 0.1 10*3/uL (ref 0.0–0.1)
Basophils Relative: 1 %
Eosinophils Absolute: 0.2 10*3/uL (ref 0.0–0.5)
Eosinophils Relative: 3 %
HCT: 43.8 % (ref 36.0–46.0)
Hemoglobin: 14.5 g/dL (ref 12.0–15.0)
Immature Granulocytes: 1 %
Lymphocytes Relative: 29 %
Lymphs Abs: 1.9 10*3/uL (ref 0.7–4.0)
MCH: 31.4 pg (ref 26.0–34.0)
MCHC: 33.1 g/dL (ref 30.0–36.0)
MCV: 94.8 fL (ref 80.0–100.0)
Monocytes Absolute: 0.5 10*3/uL (ref 0.1–1.0)
Monocytes Relative: 8 %
Neutro Abs: 3.9 10*3/uL (ref 1.7–7.7)
Neutrophils Relative %: 58 %
Platelets: 306 10*3/uL (ref 150–400)
RBC: 4.62 MIL/uL (ref 3.87–5.11)
RDW: 13.2 % (ref 11.5–15.5)
WBC: 6.7 10*3/uL (ref 4.0–10.5)
nRBC: 0 % (ref 0.0–0.2)

## 2019-06-30 LAB — TROPONIN I (HIGH SENSITIVITY)
Troponin I (High Sensitivity): 34 ng/L — ABNORMAL HIGH (ref <18)
Troponin I (High Sensitivity): 35 ng/L — ABNORMAL HIGH (ref <18)

## 2019-06-30 MED ORDER — ONDANSETRON HCL 4 MG/2ML IJ SOLN
4.0000 mg | Freq: Once | INTRAMUSCULAR | Status: AC
  Administered 2019-06-30: 21:00:00 4 mg via INTRAVENOUS
  Filled 2019-06-30: qty 2

## 2019-06-30 MED ORDER — MORPHINE SULFATE (PF) 4 MG/ML IV SOLN
4.0000 mg | Freq: Once | INTRAVENOUS | Status: AC
  Administered 2019-06-30: 21:00:00 4 mg via INTRAVENOUS
  Filled 2019-06-30: qty 1

## 2019-06-30 MED ORDER — LIDOCAINE HCL (PF) 1 % IJ SOLN
5.0000 mL | Freq: Once | INTRAMUSCULAR | Status: AC
  Administered 2019-06-30: 23:00:00 5 mL
  Filled 2019-06-30: qty 5

## 2019-06-30 MED ORDER — SODIUM CHLORIDE 0.9 % IV BOLUS
500.0000 mL | Freq: Once | INTRAVENOUS | Status: AC
  Administered 2019-06-30: 20:00:00 500 mL via INTRAVENOUS

## 2019-06-30 NOTE — ED Triage Notes (Signed)
Pt to ED via [facility] after reported falling at home on porch.  Pt does not remember the fall and has repetitive questions.  Pt has large lac to chin and c/o right sided headache.  Pt is not on any blood thinners

## 2019-06-30 NOTE — Consult Note (Signed)
OTOLARYNGOLOGY CONSULTATION    Primary Care Physician: [name], NP Patient Location at Initial Consult: Emergency Department Chief Complaint/Reason for Consult: Mandible fracture  History of Presenting Illness:    Jennifer Deleon is a  73 y.o. female presenting with mandible fracture.  Patient was outside on the porch of her home and had a fall.  She does not remember what happened.  She is been asking some repetitive questions to the staff.  She has a mandible fracture in the right maxillary sinus fracture.  It is painful to open her mouth.  She has good dentition.  No significant malocclusion.  No vision changes, no difficulty breathing through the nose or epistaxis.  She has never had a mandible injury or other facial injuries in the past.  She is otherwise in good health though does have a history of osteoporosis.  She is likely being admitted to the hospitalist service for syncopal work-up.  Of note, she is a retired Microbiologist.  Past Medical History:  Diagnosis Date  . Arthritis   . Asthma   . Chicken pox   . Colon polyps   . Depression   . Environmental allergies    allergies all year long  . Hypothyroid   . Osteoporosis   . UTI (lower urinary tract infection)     Past Surgical History:  Procedure Laterality Date  . CATARACT EXTRACTION Bilateral 2016  . LAMINECTOMY    . LUMBAR FUSION  2015   l4-L5  . TUBAL LIGATION      Family History  Problem Relation Age of Onset  . Osteoarthritis Mother   . Osteoarthritis Maternal Grandmother   . Osteoarthritis Father   . Hypertension Father   . Heart failure Father   . Prostate cancer Paternal Grandfather        mets to colon  . Arthritis Other   . Prostate cancer Other   . Mental illness Other     Social History   Socioeconomic History  . Marital status: Married    Spouse name: Not on file  . Number of children: Not on file  . Years of education: Not on file  . Highest education level: Not on file   Occupational History  . Not on file  Tobacco Use  . Smoking status: Never Smoker  . Smokeless tobacco: Never Used  Substance and Sexual Activity  . Alcohol use: Yes    Alcohol/week: 7.0 standard drinks    Types: 7 Glasses of wine per week    Comment: a glass of wine with dinner each night  . Drug use: No  . Sexual activity: Not on file  Other Topics Concern  . Not on file  Social History Narrative   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ighter    Married for  11 years    [place] who live in [place] Strain:   . Difficulty of Paying Living Expenses: Not on file  Food Insecurity:   . Worried About Charity fundraiser in the Last Year: Not on file  . Ran Out of Food in the Last Year: Not on file  Transportation Needs:   . Lack of Transportation (Medical): Not on file  . Lack of Transportation (Non-Medical): Not on file  Physical Activity:   . Days of Exercise per Week: Not on file  . Minutes of Exercise per Session: Not on file  Stress:   . Feeling of Stress : Not  on file  Social Connections:   . Frequency of Communication with Friends and Family: Not on file  . Frequency of Social Gatherings with Friends and Family: Not on file  . Attends Religious Services: Not on file  . Active Member of Clubs or Organizations: Not on file  . Attends Archivist Meetings: Not on file  . Marital Status: Not on file    No current facility-administered medications on file prior to encounter.   Current Outpatient Medications on File Prior to Encounter  Medication Sig Dispense Refill  . azelastine (ASTELIN) 0.1 % nasal spray Place 1 spray into both nostrils 2 (two) times daily. Use in each nostril as directed    . beclomethasone (QVAR) 40 MCG/ACT inhaler Inhale 2 puffs into the lungs 2 (two) times daily.    Marland Kitchen buPROPion (WELLBUTRIN XL) 300 MG 24 hr tablet Take 300 mg by mouth daily.     . calcium-vitamin D (OSCAL-500) 500-400 MG-UNIT  per tablet Take 1 tablet by mouth 2 (two) times daily with a meal.    . cetirizine (ZYRTEC) 10 MG tablet Take 10 mg by mouth daily.     . diclofenac sodium (VOLTAREN) 1 % GEL Apply topically to affected area qid (Patient taking differently: Apply 2 g topically 4 (four) times daily. ) 100 g 1  . diphenhydrAMINE (BENADRYL) 25 MG tablet Take 25 mg by mouth every 6 (six) hours as needed for allergies.    Marland Kitchen docusate sodium (COLACE) 100 MG capsule Take 100 mg by mouth 2 (two) times daily as needed for mild constipation.     . DULoxetine (CYMBALTA) 60 MG capsule Take 60 mg by mouth daily.     . fexofenadine (ALLER-EASE) 180 MG tablet Take 180 mg by mouth daily.     . fluticasone (FLONASE) 50 MCG/ACT nasal spray Place 1 spray into both nostrils 2 (two) times daily.    Marland Kitchen levothyroxine (SYNTHROID) 112 MCG tablet Take 1 tablet (112 mcg total) by mouth daily. 90 tablet 3  . montelukast (SINGULAIR) 10 MG tablet Take 10 mg by mouth at bedtime.    . Multiple Vitamin (MULTIVITAMIN WITH MINERALS) TABS Take 1 tablet by mouth daily.     Marland Kitchen omeprazole (PRILOSEC) 20 MG capsule Take 20 mg by mouth daily.    . pregabalin (LYRICA) 75 MG capsule Take 75 mg by mouth 2 (two) times daily.    . magic mouthwash SOLN Take 15 mLs by mouth 4 (four) times daily as needed for mouth pain. (Patient not taking: Reported on 06/30/2019) 240 mL 1    Allergies  Allergen Reactions  . Augmentin [Amoxicillin-Pot Clavulanate] Diarrhea and Other (See Comments)    Severe Stomach cramps  . Bactrim [Sulfamethoxazole-Trimethoprim] Swelling    Facial and lip swelling     Review of Systems: Review of systems complete and is negative except for the above-mentioned   OBJECTIVE: Vital Signs: Vitals:   06/30/19 2024 06/30/19 2315  BP: (!) 158/93   Pulse: (!) 53 61  Resp: 15 13  Temp:    SpO2: 100% 98%    I&O No intake or output data in the 24 hours ending 06/30/19 2344  Physical Exam General: Well developed, well nourished. No  acute distress. Voice without dysphonia.   Head/Face: Normocephalic, atraumatic. No scars or lesions. No sinus tenderness. Facial nerve intact and equal bilaterally.  No facial lacerations. Salivary glands non tender and without palpable masses  Eyes: Globes well positioned, no proptosis Lids: No periorbital edema/ecchymosis. No lid  laceration Conjunctiva: No chemosis, hemorrhage PERRL Extra occular movement: Full ROM bilaterally. No gaze restriction    Ears: No gross deformity. Normal external canal. Tympanic membrane intact bilaterally and without effusion  Hearing:  Normal speech reception.  Nose: No gross deformity or lesions. No purulent discharge. Septum midline. No turbinate hypertrophy.  Mouth/Oropharynx: Lips without any lesions. Dentition normal. No mucosal lesions within the oropharynx. No tonsillar enlargement, exudate, or lesions. Pharyngeal walls symmetrical. Uvula midline. Tongue midline without lesions.  Occlusion is good.  Premorbid occlusion.  Minimal trismus.  Dentition is in good condition though she did chip her left maxillary central incisor.  There is tenderness over the right temporomandibular joint but good mouth opening.   There is a laceration over the left lower lip which has been repaired nicely.  This is C/D/I  There is no chin numbness  Neck: Trachea midline. No masses. No thyromegaly or nodules palpated. No crepitus.  Lymphatic: No lymphadenopathy in the neck.  Respiratory: No stridor or distress.  Cardiovascular: Regular rate and rhythm.  Extremities: No edema or cyanosis. Warm and well-perfused.  Skin: No scars or lesions on face or neck.  Neurologic: CN II-XII intact. Moving all extremities without gross abnormality.  Other:      Labs: Lab Results  Component Value Date   WBC 6.7 06/30/2019   HGB 14.5 06/30/2019   HCT 43.8 06/30/2019   PLT 306 06/30/2019   ALT 26 06/30/2019   AST 49 (H) 06/30/2019   NA 138 06/30/2019   K 3.6 06/30/2019   CL 105  06/30/2019   CREATININE 0.93 06/30/2019   BUN 17 06/30/2019   CO2 22 06/30/2019   TSH 0.53 06/15/2019     Review of Ancillary Data / Diagnostic Tests: CT maxillofacial personally reviewed.  My interpretation is as follows: Right maxillary wall with nondisplaced fracture, posterior wall with nondisplaced fracture.  Pterygoids are intact bilaterally.  No orbital or nasal bone fractures.  The right condylar neck is fractured and the TMJ is pretty displaced on the right side.  There is no corresponding left mandibular body fracture.  ASSESSMENT:  73 y.o. female with right condylar neck fracture now with some pain with mouth opening, but minimal to no trismus.  She has good occlusion.  We will manage this conservatively but will follow her closely.  RECOMMENDATIONS: Liquid diet for 4 weeks. Follow-up in clinic in about 1 week for suture removal and for recheck of her mandible. I would recommend clindamycin for 5 days given maxillary sinus fracture and jaw fracture.   Gavin Pound, MD  [facility], [place] Office phone [phone]

## 2019-06-30 NOTE — ED Notes (Signed)
Pt to CT at this time.

## 2019-06-30 NOTE — ED Provider Notes (Signed)
Emergency Department Provider Note   I have reviewed the triage vital signs and the nursing notes.   HISTORY  Chief Complaint Fall   HPI Jennifer Deleon is a 73 y.o. female with past history reviewed below presents to the emergency department for evaluation after fall at home.  Patient states that she went out to take mail from the [place].  She states she leaned forward to take the mail from his hand when she fell to the ground.  She states the next thing she remembers she was on a gurney in the back of an ambulance.  She does not recall having chest pain, shortness of breath, heart palpitations prior to losing consciousness.  EMS report  they found her with bleeding from the chin and confused on scene.  No report of seizure activity.  Patient was at home with her husband.  In addition to head and face pain she is having pain in the bilateral ribs worse with movement.   Past Medical History:  Diagnosis Date  . Arthritis   . Asthma   . Chicken pox   . Colon polyps   . Depression   . Environmental allergies    allergies all year Kal[place]  . Hypothyroid   . Osteoporosis   . UTI (lower urinary tract infection)     Patient Active Problem List   Diagnosis Date Noted  . Pes anserine bursitis 03/01/2018  . Rib fractures 08/11/2015  . Multiple rib fractures 08/11/2015  . Chest wall pain   . Rib fracture   . Tinea corporis 06/13/2015  . Hypothyroidism 04/05/2015    Past Surgical History:  Procedure Laterality Date  . CATARACT EXTRACTION Bilateral 2016  . LAMINECTOMY    . LUMBAR FUSION  2015   l4-L5  . TUBAL LIGATION      Allergies Augmentin [amoxicillin-pot clavulanate] and Bactrim [sulfamethoxazole-trimethoprim]  Family History  Problem Relation Age of Onset  . Osteoarthritis Mother   . Osteoarthritis Maternal Grandmother   . Osteoarthritis Father   . Hypertension Father   . Heart failure Father   . Prostate cancer Paternal Grandfather        mets to colon  .  Arthritis Other   . Prostate cancer Other   . Mental illness Other     Social History Social History   Tobacco Use  . Smoking status: Never Smoker  . Smokeless tobacco: Never Used  Substance Use Topics  . Alcohol use: Yes    Alcohol/week: 7.0 standard drinks    Types: 7 Glasses of wine per week    Comment: a glass of wine with dinner each night  . Drug use: No    Review of Systems  Constitutional: No fever/chills Eyes: No visual changes. ENT: No sore throat. Positive right jaw and face pain.  Cardiovascular: Positive chest pain worse with movement.  Respiratory: Denies shortness of breath. Gastrointestinal: No abdominal pain.  No nausea, no vomiting.  No diarrhea.  No constipation. Genitourinary: Negative for dysuria. Musculoskeletal: Negative for back pain. Skin: Chin lacerations.  Neurological: Negative for headaches, focal weakness or numbness.  10-point ROS otherwise negative.  ____________________________________________   PHYSICAL EXAM:  VITAL SIGNS: ED Triage Vitals  Enc Vitals Group     BP 06/30/19 1818 (!) 138/96     Pulse Rate 06/30/19 1818 71     Resp 06/30/19 1818 16     Temp 06/30/19 1818 98 F (36.7 C)     Temp Source 06/30/19 1818 Oral  SpO2 06/30/19 1818 100 %     Weight 06/30/19 1826 112 lb (50.8 kg)     Height 06/30/19 1826 5\' 3"  (1.6 m)   Constitutional: Alert and oriented. Well appearing and in no acute distress. Eyes: Conjunctivae are normal. PERRL.  Head: Atraumatic. Nose: No congestion/rhinnorhea. Mouth/Throat: Mucous membranes are moist.  Oropharynx non-erythematous. Dental fracture # 9.  Neck: No stridor. Cardiovascular: Normal rate, regular rhythm. Good peripheral circulation. Grossly normal heart sounds.   Respiratory: Normal respiratory effort.  No retractions. Lungs CTAB. Gastrointestinal:  No distention.  Musculoskeletal: No lower extremity tenderness nor edema. No gross deformities of extremities. Normal ROM of upper and  lower extremities.  Neurologic:  Normal speech and language. No gross focal neurologic deficits are appreciated.  Skin:  Skin is warm, dry and intact. Lower lip laceration 4 cm crossing the vermillion border.   ____________________________________________   LABS (all labs ordered are listed, but only abnormal results are displayed)  Labs Reviewed  COMPREHENSIVE METABOLIC PANEL - Abnormal; Notable for the following components:      Result Value   Glucose, Bld 121 (*)    AST 49 (*)    All other components within normal limits  TROPONIN I (HIGH SENSITIVITY) - Abnormal; Notable for the following components:   Troponin I (High Sensitivity) 34 (*)    All other components within normal limits  TROPONIN I (HIGH SENSITIVITY) - Abnormal; Notable for the following components:   Troponin I (High Sensitivity) 35 (*)    All other components within normal limits  SARS CORONAVIRUS 2 (TAT 6-24 HRS)  CBC WITH DIFFERENTIAL/PLATELET   ____________________________________________  EKG   EKG Interpretation  Date/Time:  Thursday June 30 2019 18:20:23 EST Ventricular Rate:  69 PR Interval:  188 QRS Duration: 90 QT Interval:  430 QTC Calculation: 460 R Axis:   -22 Text Interpretation: Normal sinus rhythm Nonspecific ST and T wave abnormality Abnormal ECG No STEMI Confirmed by Nanda Quinton [phone]) on 06/30/2019 10:53:19 PM       ____________________________________________  RADIOLOGY  CT Head Wo Contrast  Result Date: 06/30/2019 CLINICAL DATA:  Fall at home. Chin laceration with right-sided headaches. Limited memory of injury. EXAM: CT HEAD WITHOUT CONTRAST TECHNIQUE: Contiguous axial images were obtained from the base of the skull through the vertex without intravenous contrast. COMPARISON:  CT head 08/29/2016. FINDINGS: Brain: There is no evidence of acute intracranial hemorrhage, mass lesion, brain edema or extra-axial fluid collection. The ventricles and subarachnoid spaces are  appropriately sized for age. There is no CT evidence of acute cortical infarction. Stable probable chronic calcified meningioma in the left frontotemporal region. Vascular: Mild intracranial atherosclerosis. No hyperdense vessel identified. Skull: Calvarial hyperostosis without evidence of acute fracture. Sinuses/Orbits: There is an air-fluid level within the right maxillary sinus with a possible nondisplaced fracture involving the floor of the sinus, partially imaged on image 1/3. There is a displaced fracture of the right condylar neck with resulting dislocation at the right temporomandibular joint. Advanced TMJ degenerative changes are present bilaterally. The visualized paranasal sinuses are otherwise clear. No orbital hematoma. Previous bilateral lens surgery. Other: None. IMPRESSION: 1. No acute intracranial or calvarial findings. 2. Displaced fracture of the right condylar neck with resulting dislocation at the right temporomandibular joint. Air-fluid level in the right maxillary sinus with possible nondisplaced fracture involving the floor of the sinus, incompletely visualized. Recommend further evaluation with dedicated maxillofacial CT. Electronically Signed   By: Richardean Sale M.D.   On: 06/30/2019 18:56  ____________________________________________   PROCEDURES  Procedure(s) performed:   Marland KitchenMarland KitchenLaceration Repair  Date/Time: 06/30/2019 11:00 PM Performed by: Margette Fast, MD Authorized by: Margette Fast, MD   Consent:    Consent obtained:  Verbal   Consent given by:  Patient   Risks discussed:  Infection, retained foreign body, pain, need for additional repair, poor wound healing and poor cosmetic result Anesthesia (see MAR for exact dosages):    Anesthesia method:  Local infiltration   Local anesthetic:  Lidocaine 1% w/o epi Laceration details:    Location:  Lip   Lip location:  Lower exterior lip   Length (cm):  4 Repair type:    Repair type:  Intermediate Pre-procedure  details:    Preparation:  Imaging obtained to evaluate for foreign bodies and patient was prepped and draped in usual sterile fashion Exploration:    Hemostasis achieved with:  Direct pressure   Wound exploration: entire depth of wound probed and visualized     Wound extent: no foreign bodies/material noted, no muscle damage noted, no nerve damage noted, no underlying fracture noted and no vascular damage noted     Contaminated: no   Treatment:    Area cleansed with:  Betadine and saline   Amount of cleaning:  Standard   Irrigation solution:  Sterile saline Skin repair:    Repair method:  Sutures   Suture size:  6-0   Suture material:  Prolene   Suture technique:  Simple interrupted   Number of sutures:  5 Approximation:    Approximation:  Close   Vermilion border: well-aligned   Post-procedure details:    Dressing:  Open (no dressing)   Patient tolerance of procedure:  Tolerated well, no immediate complications .Marland KitchenLaceration Repair  Date/Time: 06/30/2019 11:02 PM Performed by: Margette Fast, MD Authorized by: Margette Fast, MD   Consent:    Consent obtained:  Verbal   Consent given by:  Patient   Risks discussed:  Infection, pain, retained foreign body, poor wound healing, poor cosmetic result and need for additional repair   Alternatives discussed:  No treatment Laceration details:    Location:  Face   Face location:  Chin   Length (cm):  1 Repair type:    Repair type:  Simple Pre-procedure details:    Preparation:  Patient was prepped and draped in usual sterile fashion and imaging obtained to evaluate for foreign bodies Exploration:    Wound exploration: entire depth of wound probed and visualized     Wound extent: no fascia violation noted, no foreign bodies/material noted, no muscle damage noted, no nerve damage noted, no underlying fracture noted and no vascular damage noted   Treatment:    Area cleansed with:  Saline and Betadine   Amount of cleaning:  Standard    Irrigation solution:  Sterile saline Skin repair:    Repair method:  Sutures   Suture size:  6-0   Suture material:  Prolene   Suture technique:  Simple interrupted   Number of sutures:  1 Approximation:    Approximation:  Close Post-procedure details:    Dressing:  Open (no dressing)   Patient tolerance of procedure:  Tolerated well, no immediate complications     ____________________________________________   INITIAL IMPRESSION / ASSESSMENT AND PLAN / ED COURSE  Pertinent labs & imaging results that were available during my care of the patient were reviewed by me and considered in my medical decision making (see chart for details).   Patient presents  to the emergency department evaluation after fall with face trauma.  CT head from the lobby shows mandible fracture not completely visualized.  CT max face, C-spine, chest x-ray ordered with reads pending.  Lacerations of the face repaired and cleaned.   Labs reviewed.  Troponin at 34 then 35 on repeat.   11:11 PM  Spoke with Dr. Blenda Nicely with ENT. She will be in to see the patient. Will admit to medicine for syncope eval/obs. Follow ENT note for recs.  CT c-spine, CXR and max face reviewed. Rib rx noted on CXR without PNX. Plan for pain control and pulmonary toilet.   Discussed patient's case with [facility] to request admission. Patient and family (if present) updated with plan. Care transferred to [facility] service.  I reviewed all nursing notes, vitals, pertinent old records, EKGs, labs, imaging (as available).  ____________________________________________  FINAL CLINICAL IMPRESSION(S) / ED DIAGNOSES  Final diagnoses:  Syncope, unspecified syncope type  Closed fracture of right condylar process of mandible, initial encounter (HCC)  Lip laceration, initial encounter  Chest wall pain  Injury of head, initial encounter  Closed fracture of multiple ribs of left side, initial encounter    MEDICATIONS GIVEN DURING THIS VISIT:   Medications  sodium chloride 0.9 % bolus 500 mL (0 mLs Intravenous Stopped 06/30/19 2318)  morphine 4 MG/ML injection 4 mg (4 mg Intravenous Given 06/30/19 2030)  ondansetron (ZOFRAN) injection 4 mg (4 mg Intravenous Given 06/30/19 2030)  lidocaine (PF) (XYLOCAINE) 1 % injection 5 mL (5 mLs Infiltration Given 06/30/19 2318)    Note:  This document was prepared using Dragon voice recognition software and may include unintentional dictation errors.  Nanda Quinton, MD, [facility] Emergency Medicine    Raeya Merritts, [facility], MD 07/01/19 [phone]

## 2019-06-30 NOTE — ED Notes (Signed)
Pt's husband phone number  Jennifer Deleon  [phone]

## 2019-06-30 NOTE — ED Triage Notes (Signed)
Pt is alert and oriented x's 2

## 2019-07-01 ENCOUNTER — Observation Stay (HOSPITAL_BASED_OUTPATIENT_CLINIC_OR_DEPARTMENT_OTHER): Payer: Medicare Other

## 2019-07-01 ENCOUNTER — Encounter (HOSPITAL_COMMUNITY): Payer: Self-pay | Admitting: Internal Medicine

## 2019-07-01 ENCOUNTER — Other Ambulatory Visit: Payer: Self-pay

## 2019-07-01 DIAGNOSIS — J45909 Unspecified asthma, uncomplicated: Secondary | ICD-10-CM | POA: Diagnosis present

## 2019-07-01 DIAGNOSIS — R55 Syncope and collapse: Secondary | ICD-10-CM | POA: Diagnosis not present

## 2019-07-01 DIAGNOSIS — S01511A Laceration without foreign body of lip, initial encounter: Secondary | ICD-10-CM | POA: Diagnosis present

## 2019-07-01 DIAGNOSIS — S02611A Fracture of condylar process of right mandible, initial encounter for closed fracture: Secondary | ICD-10-CM

## 2019-07-01 DIAGNOSIS — Z8261 Family history of arthritis: Secondary | ICD-10-CM | POA: Diagnosis not present

## 2019-07-01 DIAGNOSIS — W1830XA Fall on same level, unspecified, initial encounter: Secondary | ICD-10-CM | POA: Diagnosis present

## 2019-07-01 DIAGNOSIS — M1990 Unspecified osteoarthritis, unspecified site: Secondary | ICD-10-CM | POA: Diagnosis present

## 2019-07-01 DIAGNOSIS — S0240CA Maxillary fracture, right side, initial encounter for closed fracture: Secondary | ICD-10-CM | POA: Diagnosis present

## 2019-07-01 DIAGNOSIS — S2242XA Multiple fractures of ribs, left side, initial encounter for closed fracture: Secondary | ICD-10-CM | POA: Diagnosis present

## 2019-07-01 DIAGNOSIS — Y92018 Other place in single-family (private) house as the place of occurrence of the external cause: Secondary | ICD-10-CM | POA: Diagnosis not present

## 2019-07-01 DIAGNOSIS — Z8249 Family history of ischemic heart disease and other diseases of the circulatory system: Secondary | ICD-10-CM | POA: Diagnosis not present

## 2019-07-01 DIAGNOSIS — E039 Hypothyroidism, unspecified: Secondary | ICD-10-CM

## 2019-07-01 DIAGNOSIS — S02609A Fracture of mandible, unspecified, initial encounter for closed fracture: Secondary | ICD-10-CM | POA: Diagnosis present

## 2019-07-01 DIAGNOSIS — S060X9A Concussion with loss of consciousness of unspecified duration, initial encounter: Secondary | ICD-10-CM | POA: Diagnosis present

## 2019-07-01 DIAGNOSIS — F329 Major depressive disorder, single episode, unspecified: Secondary | ICD-10-CM | POA: Diagnosis present

## 2019-07-01 DIAGNOSIS — Z79899 Other long term (current) drug therapy: Secondary | ICD-10-CM | POA: Diagnosis not present

## 2019-07-01 DIAGNOSIS — Z79890 Hormone replacement therapy (postmenopausal): Secondary | ICD-10-CM | POA: Diagnosis not present

## 2019-07-01 DIAGNOSIS — Z881 Allergy status to other antibiotic agents status: Secondary | ICD-10-CM | POA: Diagnosis not present

## 2019-07-01 DIAGNOSIS — Z981 Arthrodesis status: Secondary | ICD-10-CM | POA: Diagnosis not present

## 2019-07-01 DIAGNOSIS — M810 Age-related osteoporosis without current pathological fracture: Secondary | ICD-10-CM | POA: Diagnosis present

## 2019-07-01 DIAGNOSIS — Z20828 Contact with and (suspected) exposure to other viral communicable diseases: Secondary | ICD-10-CM | POA: Diagnosis present

## 2019-07-01 LAB — CBC
HCT: 41.5 % (ref 36.0–46.0)
Hemoglobin: 13.8 g/dL (ref 12.0–15.0)
MCH: 31.2 pg (ref 26.0–34.0)
MCHC: 33.3 g/dL (ref 30.0–36.0)
MCV: 93.9 fL (ref 80.0–100.0)
Platelets: 268 10*3/uL (ref 150–400)
RBC: 4.42 MIL/uL (ref 3.87–5.11)
RDW: 13.2 % (ref 11.5–15.5)
WBC: 11 10*3/uL — ABNORMAL HIGH (ref 4.0–10.5)
nRBC: 0 % (ref 0.0–0.2)

## 2019-07-01 LAB — BASIC METABOLIC PANEL
Anion gap: 12 (ref 5–15)
BUN: 13 mg/dL (ref 8–23)
CO2: 24 mmol/L (ref 22–32)
Calcium: 9.2 mg/dL (ref 8.9–10.3)
Chloride: 104 mmol/L (ref 98–111)
Creatinine, Ser: 0.77 mg/dL (ref 0.44–1.00)
GFR calc Af Amer: >60 mL/min (ref >60)
GFR calc non Af Amer: >60 mL/min (ref >60)
Glucose, Bld: 135 mg/dL — ABNORMAL HIGH (ref 70–99)
Potassium: 3.9 mmol/L (ref 3.5–5.1)
Sodium: 140 mmol/L (ref 135–145)

## 2019-07-01 LAB — SARS CORONAVIRUS 2 (TAT 6-24 HRS): SARS Coronavirus 2: NEGATIVE

## 2019-07-01 MED ORDER — HEPARIN SODIUM (PORCINE) 5000 UNIT/ML IJ SOLN
5000.0000 [IU] | Freq: Three times a day (TID) | INTRAMUSCULAR | Status: DC
  Administered 2019-07-01 – 2019-07-02 (×5): 5000 [IU] via SUBCUTANEOUS
  Filled 2019-07-01 (×5): qty 1

## 2019-07-01 MED ORDER — ACETAMINOPHEN 650 MG RE SUPP: 650.0000 mg | Freq: Four times a day (QID) | RECTAL | Status: DC | PRN

## 2019-07-01 MED ORDER — PANTOPRAZOLE SODIUM 40 MG PO TBEC
40.0000 mg | DELAYED_RELEASE_TABLET | Freq: Every day | ORAL | Status: DC
  Administered 2019-07-01 – 2019-07-02 (×2): 40 mg via ORAL
  Filled 2019-07-01 (×2): qty 1

## 2019-07-01 MED ORDER — POLYETHYLENE GLYCOL 3350 17 G PO PACK: 17.0000 g | PACK | Freq: Every day | ORAL | Status: DC | PRN

## 2019-07-01 MED ORDER — ACETAMINOPHEN 325 MG PO TABS
650.0000 mg | ORAL_TABLET | Freq: Four times a day (QID) | ORAL | Status: DC | PRN
  Administered 2019-07-01: 03:00:00 650 mg via ORAL
  Filled 2019-07-01: qty 2

## 2019-07-01 MED ORDER — CLINDAMYCIN PHOSPHATE 600 MG/50ML IV SOLN
600.0000 mg | Freq: Three times a day (TID) | INTRAVENOUS | Status: DC
  Administered 2019-07-01 – 2019-07-02 (×5): 600 mg via INTRAVENOUS
  Filled 2019-07-01 (×8): qty 50

## 2019-07-01 MED ORDER — ACETAMINOPHEN 325 MG PO TABS: 650.0000 mg | ORAL_TABLET | Freq: Four times a day (QID) | ORAL | Status: DC | PRN

## 2019-07-01 MED ORDER — ADULT MULTIVITAMIN W/MINERALS CH
1.0000 | ORAL_TABLET | Freq: Every day | ORAL | Status: DC
  Administered 2019-07-01 – 2019-07-02 (×2): 1 via ORAL
  Filled 2019-07-01 (×2): qty 1

## 2019-07-01 MED ORDER — ONDANSETRON HCL 4 MG/2ML IJ SOLN: 4.0000 mg | Freq: Four times a day (QID) | INTRAMUSCULAR | Status: DC | PRN

## 2019-07-01 MED ORDER — PREGABALIN 75 MG PO CAPS
75.0000 mg | ORAL_CAPSULE | Freq: Two times a day (BID) | ORAL | Status: DC
  Administered 2019-07-01 – 2019-07-02 (×4): 75 mg via ORAL
  Filled 2019-07-01 (×4): qty 1

## 2019-07-01 MED ORDER — LEVOTHYROXINE SODIUM 112 MCG PO TABS
112.0000 ug | ORAL_TABLET | Freq: Every day | ORAL | Status: DC
  Administered 2019-07-01 – 2019-07-02 (×2): 112 ug via ORAL
  Filled 2019-07-01 (×2): qty 1

## 2019-07-01 MED ORDER — LORATADINE 10 MG PO TABS
10.0000 mg | ORAL_TABLET | Freq: Every day | ORAL | Status: DC
  Administered 2019-07-01 – 2019-07-02 (×2): 10 mg via ORAL
  Filled 2019-07-01 (×2): qty 1

## 2019-07-01 MED ORDER — OXYCODONE HCL 5 MG PO TABS
5.0000 mg | ORAL_TABLET | Freq: Four times a day (QID) | ORAL | Status: DC | PRN
  Administered 2019-07-01 – 2019-07-02 (×5): 5 mg via ORAL
  Filled 2019-07-01 (×5): qty 1

## 2019-07-01 MED ORDER — ONDANSETRON HCL 4 MG PO TABS: 4.0000 mg | ORAL_TABLET | Freq: Four times a day (QID) | ORAL | Status: DC | PRN

## 2019-07-01 MED ORDER — BUPROPION HCL ER (XL) 150 MG PO TB24
300.0000 mg | ORAL_TABLET | Freq: Every day | ORAL | Status: DC
  Administered 2019-07-01 – 2019-07-02 (×2): 300 mg via ORAL
  Filled 2019-07-01 (×2): qty 2

## 2019-07-01 MED ORDER — SODIUM CHLORIDE 0.9 % IV SOLN: INTRAVENOUS | Status: AC

## 2019-07-01 MED ORDER — MONTELUKAST SODIUM 10 MG PO TABS
10.0000 mg | ORAL_TABLET | Freq: Every day | ORAL | Status: DC
  Administered 2019-07-01: 10 mg via ORAL
  Filled 2019-07-01: qty 1

## 2019-07-01 MED ORDER — METHOCARBAMOL 500 MG PO TABS
500.0000 mg | ORAL_TABLET | Freq: Three times a day (TID) | ORAL | Status: DC | PRN
  Administered 2019-07-01: 22:00:00 500 mg via ORAL
  Filled 2019-07-01: qty 1

## 2019-07-01 MED ORDER — DOCUSATE SODIUM 100 MG PO CAPS: 100.0000 mg | ORAL_CAPSULE | Freq: Two times a day (BID) | ORAL | Status: DC | PRN

## 2019-07-01 MED ORDER — DULOXETINE HCL 60 MG PO CPEP
60.0000 mg | ORAL_CAPSULE | Freq: Every day | ORAL | Status: DC
  Administered 2019-07-01 – 2019-07-02 (×2): 60 mg via ORAL
  Filled 2019-07-01 (×2): qty 1

## 2019-07-01 MED ORDER — CALCIUM CARBONATE-VITAMIN D 500-200 MG-UNIT PO TABS
1.0000 | ORAL_TABLET | Freq: Two times a day (BID) | ORAL | Status: DC
  Administered 2019-07-01 – 2019-07-02 (×4): 1 via ORAL
  Filled 2019-07-01 (×4): qty 1

## 2019-07-01 NOTE — Evaluation (Signed)
Physical Therapy Evaluation & Discharge Patient Details Name: Jennifer Deleon MRN: [id number] DOB: Mar 29, 1946 Today's Date: 07/01/2019   History of Present Illness  Pt is a 73 y.o. admitted 06/30/19 with fall resulting in mandibular fx, L 7/9th rib fx; head CT negative for intracranial injury. Pt with (+) LOC/concussion.  PMH includes hypothyroidism, depression, osteoporosis.    Clinical Impression  Patient evaluated by Physical Therapy with no further acute PT needs identified. PTA, pt independent and lives with husband. Today, pt moving well at supervision-level, self-correct instability at times which pt attributes to laying in bed since admission. Concussion education and handout provided. Pt will have necessary support from husband. All education has been completed and the patient has no further questions. Acute PT is signing off. Thank you for this referral.   Follow Up Recommendations No PT follow up;Supervision for mobility/OOB    Equipment Recommendations  None recommended by PT    Recommendations for Other Services       Precautions / Restrictions Precautions Precautions: Fall Precaution Comments: Abdominal for comfort with rib fxs Restrictions Weight Bearing Restrictions: No      Mobility  Bed Mobility Overal bed mobility: Independent                Transfers   Equipment used: None Transfers: Sit to/from Stand Sit to Stand: Supervision;Min guard         General transfer comment: Initial unsteadiness upon standing, pt able to self-correct without assist; progressing to supervision by end of session  Ambulation/Gait Ambulation/Gait assistance: Min guard;Supervision Gait Distance (Feet): 200 Feet Assistive device: None Gait Pattern/deviations: Step-through pattern;Decreased stride length   Gait velocity interpretation: 1.31 - 2.62 ft/sec, indicative of limited community ambulator General Gait Details: Initial min guard for ambulation with pt drifting  towards R-side, aware of balance deficits and able to correct, stability improving with distance  Stairs Stairs: Yes Stairs assistance: Supervision Stair Management: Alternating pattern;One rail Right;Forwards Number of Stairs: 5 General stair comments: Use of bed rail, supervision due to fall risk  Wheelchair Mobility    Modified Rankin (Stroke Patients Only)       Balance Overall balance assessment: Needs assistance   Sitting balance-Leahy Scale: Good       Standing balance-Leahy Scale: Good                               Pertinent Vitals/Pain Pain Assessment: Faces Faces Pain Scale: Hurts a little bit Pain Location: Ribs Pain Descriptors / Indicators: Discomfort Pain Intervention(s): Monitored during session    Home Living Family/patient expects to be discharged to:: Private residence Living Arrangements: Spouse/significant other Available Help at Discharge: Family;Available 24 hours/day Type of Home: House Home Access: Stairs to enter Entrance Stairs-Rails: Psychiatric nurse of Steps: 3-4 Home Layout: Two level Home Equipment: Hand held shower head;Walker - 2 wheels      Prior Function Level of Independence: Independent               Hand Dominance        Extremity/Trunk Assessment   Upper Extremity Assessment Upper Extremity Assessment: Overall WFL for tasks assessed    Lower Extremity Assessment Lower Extremity Assessment: Overall WFL for tasks assessed       Communication   Communication: No difficulties  Cognition Arousal/Alertness: Awake/alert Behavior During Therapy: WFL for tasks assessed/performed Overall Cognitive Status: Within Functional Limits for tasks assessed  General Comments General comments (skin integrity, edema, etc.): OT provided concussion education and handout    Exercises     Assessment/Plan    PT Assessment Patent does not  need any further PT services  PT Problem List         PT Treatment Interventions      PT Goals (Current goals can be found in the Care Plan section)  Acute Rehab PT Goals PT Goal Formulation: All assessment and education complete, DC therapy    Frequency     Barriers to discharge        Co-evaluation PT/OT/SLP Co-Evaluation/Treatment: Yes Reason for Co-Treatment: Other (comment);To address functional/ADL transfers(imminent d/c)           AM-PAC PT "6 Clicks" Mobility  Outcome Measure Help needed turning from your back to your side while in a flat bed without using bedrails?: None Help needed moving from lying on your back to sitting on the side of a flat bed without using bedrails?: None Help needed moving to and from a bed to a chair (including a wheelchair)?: A Little Help needed standing up from a chair using your arms (e.g., wheelchair or bedside chair)?: A Little Help needed to walk in hospital room?: A Little Help needed climbing 3-5 steps with a railing? : A Little 6 Click Score: 20    End of Session   Activity Tolerance: Patient tolerated treatment well Patient left: in bed;with call bell/phone within reach Nurse Communication: Mobility status PT Visit Diagnosis: Other abnormalities of gait and mobility (R26.89)    Time: [id number] PT Time Calculation (min) (ACUTE ONLY): 20 min   Charges:   PT Evaluation $PT Eval Moderate Complexity: Kimball, PT, DPT Acute Rehabilitation Services  Pager [phone] Office [place] 07/01/2019, 1:51 PM

## 2019-07-01 NOTE — Discharge Instructions (Signed)
Jaw Fracture Eating Plan A break (fracture) of the jaw bone often needs surgery for treatment. After surgery, you will need to eat foods that can be blended so that they can be sipped from a straw or given through a syringe. Work with a diet and nutrition specialist (dietitian) to create an eating plan that helps you get the nutrients you need in order to heal and stay healthy. What are tips for following this plan? General guidelines  All foods in this plan must be blended. Avoid nuts, seeds, skins, peels, bones, or any foods that cannot be blended to the right consistency.  Ask your health care provider about taking a liquid multivitamin to make sure that you get all the vitamins and minerals you need. Cooking   Before blending, remove any skins, seeds, or peels from food.  Cook meats and vegetables until tender.  Cut foods into small pieces and mix with a small amount of liquid in a food processor or blender. Continue to add liquid until the food becomes thin enough to sip through a straw.  Add liquids such as juice, milk, cream, broth, gravy, or vegetable juice to help add flavor to foods.  Heat foods after they have been blended, not before. This reduces the amount of foam created from blending.  If you need to increase calories in food: ? Add protein powder or powdered milk to foods. ? Cook with fats, such as margarine (without trans fat), sour cream, cream cheese, cream, or nut butters. ? Prepare foods with sweeteners, such as honey, ice cream, blackstrap molasses, or sugar. Meal planning  Eat at least three meals and three snacks daily. It is important to make sure that you get enough calories and protein to prevent weight loss and help your body heal, especially after surgery.  Eat a variety of foods from each food group every day, including fruits and vegetables, protein, whole grains, dairy, and healthy fats.  If your teeth and mouth are sensitive to extreme temperatures,  heat or cool your foods to lukewarm temperatures. What foods are recommended? The items listed may not be a complete list. Talk with your dietitian about what dietary choices are best for you. Grains Hot cereals, such as oatmeal, grits, ground wheat cereals, and polenta. Rice and pasta. Couscous. Vegetables All cooked or canned vegetables, without seeds and skins. Vegetable juices. Cooked potatoes, without skins. Fruits Any cooked or canned fruits, without seeds and skins. Fresh, peeled soft fruits, such as bananas and peaches, that can be blended until smooth. All fruit juices, without seeds and skins. Meat and other protein foods Soft-boiled eggs, scrambled eggs, powdered eggs, pasteurized egg mixtures, and custard. Ground meats, such as hamburger, [place], sausage, and meatloaf. Tender, well-cooked meat, poultry, and fish, prepared without bones or skin. Soft soy foods, such as tofu. Smooth nut butters. Liquid egg substitutes. Dairy Milk. Cheese. Yogurt. Cottage cheese. Pudding. Beverages Coffee (regular or decaffeinated), tea, and mineral water. Liquid supplements that have protein and calories. Fats and oils Any oils. Melted margarine or butter. Ghee. Sour cream. Cream cheese. Avocado. Seasoning and other foods All seasonings and condiments that blend well. Ground spices. Finely ground seeds and nuts. Mustard or any smooth condiment. Summary  Foods in this plan need to be prepared so that they can be sipped from a straw or given through a syringe. Try to have at least three meals and three snacks daily.  Avoid nuts, seeds, skins, peels, bones, or any foods that cannot be blended to  the right consistency. Make sure you eat a variety of foods from each food group every day.  Include a liquid multivitamin in your plan as told by your health care provider or dietitian. This information is not intended to replace advice given to you by your health care provider. Make sure you discuss any  questions you have with your health care provider. Document Released: 12/11/2009 Document Revised: 10/15/2018 Document Reviewed: 09/30/2016 Elsevier Patient Education  2020 [employer] Inc[employer]  

## 2019-07-01 NOTE — Progress Notes (Signed)
ENT Consult Progress Note  Subjective:  Patient did well overnight.  Her right jaw is sore but she is not having any trismus or malocclusion.  She is able to tolerate the liquid diet well.  Of note, concussion and rib fractures were ultimately diagnosed last night.  She had an echo done this morning.  Objective: Vitals:   07/01/19 0801 07/01/19 1240  BP: (!) 95/58 128/67  Pulse: 61 (!) 56  Resp: 16 16  Temp: 97.9 F (36.6 C) 97.9 F (36.6 C)  SpO2: 99% 98%    Physical Exam: Occlusion is good.  There is no trismus.  She has normal range of motion with mouth opening.  There is no facial numbness.  Good facial symmetry though there is little swelling over the right TMJ.  Left lower lip laceration repaired by the ED is clean/dry/intact  Assessment and Plan: Jennifer Deleon 73 y.o. female who presents with right displaced condylar neck fracture, in addition to nondisplaced right anterior maxillary sinus fracture.  We discussed surgical options including closed reduction with maxillomandibular fixation (wiring jaw shut) versus conservative management with a liquid diet for 4 weeks.  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  We discussed that risks of not wiring her shut could include nonunion, loss of vertical height of the right side of the mandible and trismus.  However, she currently has no significant obvious loss of vertical height on exam and no trismus.  She wishes to avoid MMF.  We will monitor her closely.  I will see her back in clinic in about 5 to 7 days.  At that time I will plan on removing the sutures from her left lower lip and we can reevaluate for the need for MMF at that time.      Thank you for involving [facility] in the care of this patient. Should you need further assistance, please call our office at [phone].    Gavin Pound, MD

## 2019-07-01 NOTE — Plan of Care (Signed)
Patient is progress towards care plan goals. No signs or symptoms of infection at this time

## 2019-07-01 NOTE — ED Notes (Signed)
ED TO INPATIENT HANDOFF REPORT  ED Nurse Name and Phone #: Gwyndolyn Saxon [phone]  S Name/Age/Gender Jennifer Deleon 73 y.o. female Room/Bed: 053C/053C  Code Status   Code Status: Prior  Home/SNF/Other Home Patient oriented to: self, place, time and situation Is this baseline? Yes   Triage Complete: Triage complete  Chief Complaint Syncope [R55]  Triage Note Pt to ED via [facility] after reported falling at home on porch.  Pt does not remember the fall and has repetitive questions.  Pt has large lac to chin and c/o right sided headache.  Pt is not on any blood thinners   Pt is alert and oriented x's 2    Allergies Allergies  Allergen Reactions  . Augmentin [Amoxicillin-Pot Clavulanate] Diarrhea and Other (See Comments)    Severe Stomach cramps  . Bactrim [Sulfamethoxazole-Trimethoprim] Swelling    Facial and lip swelling    Level of Care/Admitting Diagnosis ED Disposition    ED Disposition Condition Comment   Admit  Hospital Area: [facility] [[id number]]  Level of Care: Telemetry Medical [104]  I expect the patient will be discharged within 24 hours: No (not a candidate for 5C-Observation unit)  Covid Evaluation: Asymptomatic Screening Protocol (No Symptoms)  Diagnosis: Syncope [206001]  Admitting Physician: Rise Patience [phone]  Attending Physician: Rise Patience [email or web address].Right       B Medical/Surgery History Past Medical History:  Diagnosis Date  . Arthritis   . Asthma   . Chicken pox   . Colon polyps   . Depression   . Environmental allergies    allergies all year long  . Hypothyroid   . Osteoporosis   . UTI (lower urinary tract infection)    Past Surgical History:  Procedure Laterality Date  . CATARACT EXTRACTION Bilateral 2016  . LAMINECTOMY    . LUMBAR FUSION  2015   l4-L5  . TUBAL LIGATION       A IV Location/Drains/Wounds Patient Lines/Drains/Airways Status   Active Line/Drains/Airways    Name:   Placement date:    Placement time:   Site:   Days:   Peripheral IV 06/30/19 Left Antecubital   06/30/19    --    Antecubital   1          Intake/Output Last 24 hours No intake or output data in the 24 hours ending 07/01/19 0215  Labs/Imaging Results for orders placed or performed during the hospital encounter of 06/30/19 (from the past 48 hour(s))  CBC with Differential     Status: None   Collection Time: 06/30/19  6:32 PM  Result Value Ref Range   WBC 6.7 4.0 - 10.5 K/uL   RBC 4.62 3.87 - 5.11 MIL/uL   Hemoglobin 14.5 12.0 - 15.0 g/dL   HCT 43.8 36.0 - 46.0 %   MCV 94.8 80.0 - 100.0 fL   MCH 31.4 26.0 - 34.0 pg   MCHC 33.1 30.0 - 36.0 g/dL   RDW 13.2 11.5 - 15.5 %   Platelets 306 150 - 400 K/uL   nRBC 0.0 0.0 - 0.2 %   Neutrophils Relative % 58 %   Neutro Abs 3.9 1.7 - 7.7 K/uL   Lymphocytes Relative 29 %   Lymphs Abs 1.9 0.7 - 4.0 K/uL   Monocytes Relative 8 %   Monocytes Absolute 0.5 0.1 - 1.0 K/uL   Eosinophils Relative 3 %   Eosinophils Absolute 0.2 0.0 - 0.5 K/uL   Basophils Relative 1 %   Basophils Absolute 0.1  0.0 - 0.1 K/uL   Immature Granulocytes 1 %   Abs Immature Granulocytes 0.04 0.00 - 0.07 K/uL    Comment: Performed at [facility], [place] [address], [place], [place] [address]  Comprehensive metabolic panel     Status: Abnormal   Collection Time: 06/30/19  6:32 PM  Result Value Ref Range   Sodium 138 135 - 145 mmol/L   Potassium 3.6 3.5 - 5.1 mmol/L   Chloride 105 98 - 111 mmol/L   CO2 22 22 - 32 mmol/L   Glucose, Bld 121 (H) 70 - 99 mg/dL   BUN 17 8 - 23 mg/dL   Creatinine, Ser 0.93 0.44 - 1.00 mg/dL   Calcium 9.7 8.9 - 10.3 mg/dL   Total Protein 6.8 6.5 - 8.1 g/dL   Albumin 4.4 3.5 - 5.0 g/dL   AST 49 (H) 15 - 41 U/L   ALT 26 0 - 44 U/L   Alkaline Phosphatase 65 38 - 126 U/L   Total Bilirubin 0.3 0.3 - 1.2 mg/dL   GFR calc non Af Amer >60 >60 mL/min   GFR calc Af Amer >60 >60 mL/min   Anion gap 11 5 - 15    Comment: Performed at [facility] [address], [place], [place] [address]  Troponin I (High Sensitivity)     Status: Abnormal   Collection Time: 06/30/19  8:13 PM  Result Value Ref Range   Troponin I (High Sensitivity) 34 (H) <18 Deleon/L    Comment: (NOTE) Elevated high sensitivity troponin I (hsTnI) values and significant  changes across serial measurements may suggest ACS but many other  chronic and acute conditions are known to elevate hsTnI results.  Refer to the "Links" section for chest pain algorithms and additional  guidance. Performed at [facility], [place] [address] [place], [place] [address]   Troponin I (High Sensitivity)     Status: Abnormal   Collection Time: 06/30/19  9:53 PM  Result Value Ref Range   Troponin I (High Sensitivity) 35 (H) <18 Deleon/L    Comment: (NOTE) Elevated high sensitivity troponin I (hsTnI) values and significant  changes across serial measurements may suggest ACS but many other  chronic and acute conditions are known to elevate hsTnI results.  Refer to the "Links" section for chest pain algorithms and additional  guidance. Performed at [facility], [place] [address], [place], [place] [address]    DG Chest 2 View  Result Date: 06/30/2019 CLINICAL DATA:  Fall with bilateral rib pain for EXAM: CHEST - 2 VIEW COMPARISON:  None. FINDINGS: The heart size and mediastinal contours are within normal limits. Both lungs are clear. There is nondisplaced fracture of the lateral seventh right rib and posterior left ninth rib. This is somewhat limited due to diffuse osteopenia. IMPRESSION: No acute cardiopulmonary process. Nondisplaced posterior left ninth rib and lateral seventh rib fracture. Electronically Signed   By: Prudencio Pair M.D.   On: 06/30/2019 21:05   CT Head Wo Contrast  Result Date: 06/30/2019 CLINICAL DATA:  Fall at home. Chin laceration with right-sided headaches. Limited memory of injury. EXAM: CT HEAD WITHOUT CONTRAST TECHNIQUE: Contiguous axial images were obtained from  the base of the skull through the vertex without intravenous contrast. COMPARISON:  CT head 08/29/2016. FINDINGS: Brain: There is no evidence of acute intracranial hemorrhage, mass lesion, brain edema or extra-axial fluid collection. The ventricles and subarachnoid spaces are appropriately sized for age. There is no CT  evidence of acute cortical infarction. Stable probable chronic calcified meningioma in the left frontotemporal region. Vascular: Mild intracranial atherosclerosis. No hyperdense vessel identified. Skull: Calvarial hyperostosis without evidence of acute fracture. Sinuses/Orbits: There is an air-fluid level within the right maxillary sinus with a possible nondisplaced fracture involving the floor of the sinus, partially imaged on image 1/3. There is a displaced fracture of the right condylar neck with resulting dislocation at the right temporomandibular joint. Advanced TMJ degenerative changes are present bilaterally. The visualized paranasal sinuses are otherwise clear. No orbital hematoma. Previous bilateral lens surgery. Other: None. IMPRESSION: 1. No acute intracranial or calvarial findings. 2. Displaced fracture of the right condylar neck with resulting dislocation at the right temporomandibular joint. Air-fluid level in the right maxillary sinus with possible nondisplaced fracture involving the floor of the sinus, incompletely visualized. Recommend further evaluation with dedicated maxillofacial CT. Electronically Signed   By: Richardean Sale M.D.   On: 06/30/2019 18:56   CT Cervical Spine Wo Contrast  Result Date: 06/30/2019 CLINICAL DATA:  73 year old female with head trauma. EXAM: CT MAXILLOFACIAL WITHOUT CONTRAST CT CERVICAL SPINE WITHOUT CONTRAST TECHNIQUE: Multidetector CT imaging of the maxillofacial structures was performed. Multiplanar CT image reconstructions were also generated. A small metallic BB was placed on the right temple in order to reliably differentiate right from left.  Multidetector CT imaging of the cervical spine was performed without intravenous contrast. Multiplanar CT image reconstructions were also generated. COMPARISON:  Head CT dated 06/30/2019. FINDINGS: CT MAXILLOFACIAL FINDINGS Osseous: There is a displaced fracture of the right mandibular condyle neck with lateral positioning of the mandibular condylar fracture fragment in relation to the body of the mandible and approximately 1 cm overlap. There is a mildly displaced small triangular corner fracture of the right mandibular condyle there is anterior subluxation of the right mandibular condyle in relation to the mandibular fossa. There is a nondisplaced fracture of the inferior aspect of the right maxillary sinus with extension of the fracture into the anterior and posterolateral walls. There is a minimally displaced fracture of the posterior wall of the right maxillary sinus. Orbits: The globes and retro-orbital fat are preserved. Sinuses: There is partial opacification of the right maxillary sinus with high attenuating content and air fluid level consistent with hemosinus. The mastoid air cells are clear. Soft tissues: Negative. Limited intracranial: No significant or unexpected finding. CT CERVICAL FINDINGS Alignment: No acute subluxation. Grade 1 C7-T1 anterolisthesis. Skull base and vertebrae: No acute fracture. Osteopenia. Soft tissues and spinal canal: No prevertebral fluid or swelling. No visible canal hematoma. Disc levels: Multilevel degenerative changes most prominent at C5-C6, C6-C7, and C7-T1. Upper chest: Negative. Other: Left carotid bulb calcified plaques. IMPRESSION: 1. Displaced fractures of the right mandibular condyle neck and head as described with anterior subluxation of the right mandibular condyle in relation to the mandibular fossa. 2. Nondisplaced fractures of the wall of the right maxillary sinus with hemosinus. 3. No acute/traumatic cervical spine pathology. Electronically Signed   By: Anner Crete M.D.   On: 06/30/2019 21:27   CT Maxillofacial WO CM  Result Date: 06/30/2019 CLINICAL DATA:  73 year old female with head trauma. EXAM: CT MAXILLOFACIAL WITHOUT CONTRAST CT CERVICAL SPINE WITHOUT CONTRAST TECHNIQUE: Multidetector CT imaging of the maxillofacial structures was performed. Multiplanar CT image reconstructions were also generated. A small metallic BB was placed on the right temple in order to reliably differentiate right from left. Multidetector CT imaging of the cervical spine was performed without intravenous contrast. Multiplanar CT image reconstructions  were also generated. COMPARISON:  Head CT dated 06/30/2019. FINDINGS: CT MAXILLOFACIAL FINDINGS Osseous: There is a displaced fracture of the right mandibular condyle neck with lateral positioning of the mandibular condylar fracture fragment in relation to the body of the mandible and approximately 1 cm overlap. There is a mildly displaced small triangular corner fracture of the right mandibular condyle there is anterior subluxation of the right mandibular condyle in relation to the mandibular fossa. There is a nondisplaced fracture of the inferior aspect of the right maxillary sinus with extension of the fracture into the anterior and posterolateral walls. There is a minimally displaced fracture of the posterior wall of the right maxillary sinus. Orbits: The globes and retro-orbital fat are preserved. Sinuses: There is partial opacification of the right maxillary sinus with high attenuating content and air fluid level consistent with hemosinus. The mastoid air cells are clear. Soft tissues: Negative. Limited intracranial: No significant or unexpected finding. CT CERVICAL FINDINGS Alignment: No acute subluxation. Grade 1 C7-T1 anterolisthesis. Skull base and vertebrae: No acute fracture. Osteopenia. Soft tissues and spinal canal: No prevertebral fluid or swelling. No visible canal hematoma. Disc levels: Multilevel degenerative changes  most prominent at C5-C6, C6-C7, and C7-T1. Upper chest: Negative. Other: Left carotid bulb calcified plaques. IMPRESSION: 1. Displaced fractures of the right mandibular condyle neck and head as described with anterior subluxation of the right mandibular condyle in relation to the mandibular fossa. 2. Nondisplaced fractures of the wall of the right maxillary sinus with hemosinus. 3. No acute/traumatic cervical spine pathology. Electronically Signed   By: Anner Crete M.D.   On: 06/30/2019 21:27    Pending Labs Unresulted Labs (From admission, onward)    Start     Ordered   06/30/19 2311  SARS CORONAVIRUS 2 (TAT 6-24 HRS) Nasopharyngeal Nasopharyngeal Swab  (Tier 3 (TAT 6-24 hrs))  ONCE - STAT,   STAT    Question Answer Comment  Is this test for diagnosis or screening Screening   Symptomatic for COVID-19 as defined by CDC No   Hospitalized for COVID-19 No   Admitted to ICU for COVID-19 No   Previously tested for COVID-19 No   Resident in a congregate (group) care setting No   Employed in healthcare setting No   Pregnant No      06/30/19 2310          Vitals/Pain Today's Vitals   06/30/19 2024 06/30/19 2315 07/01/19 0053 07/01/19 0200  BP: (!) 158/93   122/70  Pulse: (!) 53 61  63  Resp: 15 13  14   Temp:      TempSrc:      SpO2: 100% 98%  100%  Weight:      Height:      PainSc:   0-No pain     Isolation Precautions No active isolations  Medications Medications  sodium chloride 0.9 % bolus 500 mL (0 mLs Intravenous Stopped 06/30/19 2318)  morphine 4 MG/ML injection 4 mg (4 mg Intravenous Given 06/30/19 2030)  ondansetron (ZOFRAN) injection 4 mg (4 mg Intravenous Given 06/30/19 2030)  lidocaine (PF) (XYLOCAINE) 1 % injection 5 mL (5 mLs Infiltration Given 06/30/19 2318)    Mobility walks High fall risk   Focused Assessments Neuro Assessment Handoff:  Swallow screen pass? N/A         Neuro Assessment: Within Defined Limits Neuro Checks:      Last  Documented NIHSS Modified Score:   Has TPA been given? No If patient is a Neuro Trauma  and patient is going to OR before floor call report to [place] nurse: [phone] or [phone]     R Recommendations: See Admitting Provider Note  Report given to: Megan,3W RN   Additional Notes:  Patient is A&Ox 4; pt able to walk w/o assistance; Lac to chin with bleeding controled.-Monique,RN

## 2019-07-01 NOTE — H&P (Signed)
History and Physical    Jennifer Deleon [id number] DOB: 02/23/1946 DOA: 06/30/2019  PCP: [name], NP  Patient coming from: Home.  Chief Complaint: Fall.  HPI: Jennifer Deleon is a 73 y.o. female with history of hypothyroidism was brought to the ER after patient had a fall at home.  Patient states she was on the porch and climbing down the steps to receive a mail when she is stressed and suddenly fell and the next thing she remembers was that she was being brought to the hospital in the ambulance.  Denies any chest pain dizziness palpitation or shortness of breath prior to the fall.  ED Course: In the ER patient had CT head and CT maxillofacial and C-spine which shows right maxillary and mandibular fracture for which ENT has been consulted.  X-ray of the chest also shows seventh and ninth rib nondisplaced fractures.  Given that patient had total loss of consciousness after the fall concerning for syncope patient was admitted for further observation.  EKG shows normal sinus rhythm with nonspecific ST-T changes and QTC of 460 ms.  Labs show high sensitive troponin of 34 and 35.  Covid test was negative.  Review of Systems: As per HPI, rest all negative.   Past Medical History:  Diagnosis Date  . Arthritis   . Asthma   . Chicken pox   . Colon polyps   . Depression   . Environmental allergies    allergies all year long  . Hypothyroid   . Osteoporosis   . UTI (lower urinary tract infection)     Past Surgical History:  Procedure Laterality Date  . CATARACT EXTRACTION Bilateral 2016  . LAMINECTOMY    . LUMBAR FUSION  2015   l4-L5  . TUBAL LIGATION       reports that she has never smoked. She has never used smokeless tobacco. She reports current alcohol use of about 7.0 standard drinks of alcohol per week. She reports that she does not use drugs.  Allergies  Allergen Reactions  . Augmentin [Amoxicillin-Pot Clavulanate] Diarrhea and Other (See Comments)    Severe Stomach  cramps  . Bactrim [Sulfamethoxazole-Trimethoprim] Swelling    Facial and lip swelling    Family History  Problem Relation Age of Onset  . Osteoarthritis Mother   . Osteoarthritis Maternal Grandmother   . Osteoarthritis Father   . Hypertension Father   . Heart failure Father   . Prostate cancer Paternal Grandfather        mets to colon  . Arthritis Other   . Prostate cancer Other   . Mental illness Other     Prior to Admission medications   Medication Sig Start Date End Date Taking? Authorizing Provider  azelastine (ASTELIN) 0.1 % nasal spray Place 1 spray into both nostrils 2 (two) times daily. Use in each nostril as directed   Yes Tiajuana Amass, MD  beclomethasone (QVAR) 40 MCG/ACT inhaler Inhale 2 puffs into the lungs 2 (two) times daily.   Yes [provider]  buPROPion (WELLBUTRIN XL) 300 MG 24 hr tablet Take 300 mg by mouth daily.    Yes [provider]  calcium-vitamin D (OSCAL-500) 500-400 MG-UNIT per tablet Take 1 tablet by mouth 2 (two) times daily with a meal.   Yes [provider]  cetirizine (ZYRTEC) 10 MG tablet Take 10 mg by mouth daily.    Yes [provider]  diclofenac sodium (VOLTAREN) 1 % GEL Apply topically to affected area qid Patient  taking differently: Apply 2 g topically 4 (four) times daily.  03/01/18  Yes Gerda Diss, DO  diphenhydrAMINE (BENADRYL) 25 MG tablet Take 25 mg by mouth every 6 (six) hours as needed for allergies.   Yes [provider]  docusate sodium (COLACE) 100 MG capsule Take 100 mg by mouth 2 (two) times daily as needed for mild constipation.    Yes [provider]  DULoxetine (CYMBALTA) 60 MG capsule Take 60 mg by mouth daily.  12/23/16  Yes [provider]  fexofenadine (ALLER-EASE) 180 MG tablet Take 180 mg by mouth daily.    Yes [provider]  fluticasone (FLONASE) 50 MCG/ACT nasal spray Place 1 spray into both nostrils 2 (two) times daily. 12/23/16  Yes [provider]  levothyroxine (SYNTHROID) 112 MCG tablet Take 1 tablet (112 mcg total) by mouth daily. 06/17/19  Yes Nafziger, Tommi Rumps, NP  montelukast (SINGULAIR) 10 MG tablet Take 10 mg by mouth at bedtime.   Yes [provider]  Multiple Vitamin (MULTIVITAMIN WITH MINERALS) TABS Take 1 tablet by mouth daily.    Yes [provider]  omeprazole (PRILOSEC) 20 MG capsule Take 20 mg by mouth daily.   Yes Leta Baptist, MD  pregabalin (LYRICA) 75 MG capsule Take 75 mg by mouth 2 (two) times daily. 03/18/19  Yes [provider]  magic mouthwash SOLN Take 15 mLs by mouth 4 (four) times daily as needed for mouth pain. Patient not taking: Reported on 06/30/2019 11/15/18   Eulas Post, MD    Physical Exam: Constitutional: Moderately built and nourished. Vitals:   06/30/19 2315 07/01/19 0200 07/01/19 0249 07/01/19 0301  BP:  122/70 140/74 127/72  Pulse: 61 63 (!) 59 (!) 57  Resp: 13 14 18 15   Temp:   97.9 F (36.6 C) 97.9 F (36.6 C)  TempSrc:   Oral Oral  SpO2: 98% 100% 98% 100%  Weight:      Height:       Eyes: Anicteric no pallor. ENMT: Swelling of the right maxillary area and mandibular area. Neck: No neck rigidity. Respiratory: No rhonchi or crepitations. Cardiovascular: S1-S2 heard. Abdomen: Soft nontender bowel sounds present. Musculoskeletal: No edema. Skin: Erythema of the right facial area. Neurologic: Alert awake oriented time place and person.  Moves all extremities. Psychiatric: Appears normal.   Labs on Admission: I have personally reviewed following labs and imaging studies  CBC: Recent Labs  Lab 06/30/19 1832  WBC 6.7  NEUTROABS 3.9  HGB 14.5  HCT 43.8  MCV 94.8  PLT [id number]   Basic Metabolic Panel: Recent Labs  Lab 06/30/19 1832  NA 138  K 3.6  CL 105  CO2 22  GLUCOSE 121*  BUN 17  CREATININE 0.93  CALCIUM 9.7   GFR: Estimated Creatinine Clearance: 41.9 mL/min (by C-G formula based on SCr of 0.93 mg/dL). Liver Function  Tests: Recent Labs  Lab 06/30/19 1832  AST 49*  ALT 26  ALKPHOS 65  BILITOT 0.3  PROT 6.8  ALBUMIN 4.4   No results for input(s): LIPASE, AMYLASE in the last 168 hours. No results for input(s): AMMONIA in the last 168 hours. Coagulation Profile: No results for input(s): INR, PROTIME in the last 168 hours. Cardiac Enzymes: No results for input(s): CKTOTAL, CKMB, CKMBINDEX, TROPONINI in the last 168 hours. BNP (last 3 results) No results for input(s): PROBNP in the last 8760 hours. HbA1C: No results for input(s): HGBA1C in the last 72 hours. CBG: No results for input(s):  GLUCAP in the last 168 hours. Lipid Profile: No results for input(s): CHOL, HDL, LDLCALC, TRIG, CHOLHDL, LDLDIRECT in the last 72 hours. Thyroid Function Tests: No results for input(s): TSH, T4TOTAL, FREET4, T3FREE, THYROIDAB in the last 72 hours. Anemia Panel: No results for input(s): VITAMINB12, FOLATE, FERRITIN, TIBC, IRON, RETICCTPCT in the last 72 hours. Urine analysis:    Component Value Date/Time   COLORURINE YELLOW 12/06/2017 1629   APPEARANCEUR HAZY (A) 12/06/2017 1629   LABSPEC 1.011 12/06/2017 1629   PHURINE 6.0 12/06/2017 1629   GLUCOSEU NEGATIVE 12/06/2017 1629   HGBUR SMALL (A) 12/06/2017 1629   BILIRUBINUR NEGATIVE 12/06/2017 1629   KETONESUR NEGATIVE 12/06/2017 1629   PROTEINUR 30 (A) 12/06/2017 1629   NITRITE NEGATIVE 12/06/2017 1629   LEUKOCYTESUR LARGE (A) 12/06/2017 1629   Sepsis Labs: @LABRCNTIP (procalcitonin:4,lacticidven:4) )No results found for this or any previous visit (from the past 240 hour(s)).   Radiological Exams on Admission: DG Chest 2 View  Result Date: 06/30/2019 CLINICAL DATA:  Fall with bilateral rib pain for EXAM: CHEST - 2 VIEW COMPARISON:  None. FINDINGS: The heart size and mediastinal contours are within normal limits. Both lungs are clear. There is nondisplaced fracture of the lateral seventh right rib and posterior left ninth rib. This is somewhat limited  due to diffuse osteopenia. IMPRESSION: No acute cardiopulmonary process. Nondisplaced posterior left ninth rib and lateral seventh rib fracture. Electronically Signed   By: Prudencio Pair M.D.   On: 06/30/2019 21:05   CT Head Wo Contrast  Result Date: 06/30/2019 CLINICAL DATA:  Fall at home. Chin laceration with right-sided headaches. Limited memory of injury. EXAM: CT HEAD WITHOUT CONTRAST TECHNIQUE: Contiguous axial images were obtained from the base of the skull through the vertex without intravenous contrast. COMPARISON:  CT head 08/29/2016. FINDINGS: Brain: There is no evidence of acute intracranial hemorrhage, mass lesion, brain edema or extra-axial fluid collection. The ventricles and subarachnoid spaces are appropriately sized for age. There is no CT evidence of acute cortical infarction. Stable probable chronic calcified meningioma in the left frontotemporal region. Vascular: Mild intracranial atherosclerosis. No hyperdense vessel identified. Skull: Calvarial hyperostosis without evidence of acute fracture. Sinuses/Orbits: There is an air-fluid level within the right maxillary sinus with a possible nondisplaced fracture involving the floor of the sinus, partially imaged on image 1/3. There is a displaced fracture of the right condylar neck with resulting dislocation at the right temporomandibular joint. Advanced TMJ degenerative changes are present bilaterally. The visualized paranasal sinuses are otherwise clear. No orbital hematoma. Previous bilateral lens surgery. Other: None. IMPRESSION: 1. No acute intracranial or calvarial findings. 2. Displaced fracture of the right condylar neck with resulting dislocation at the right temporomandibular joint. Air-fluid level in the right maxillary sinus with possible nondisplaced fracture involving the floor of the sinus, incompletely visualized. Recommend further evaluation with dedicated maxillofacial CT. Electronically Signed   By: Richardean Sale M.D.   On:  06/30/2019 18:56   CT Cervical Spine Wo Contrast  Result Date: 06/30/2019 CLINICAL DATA:  73 year old female with head trauma. EXAM: CT MAXILLOFACIAL WITHOUT CONTRAST CT CERVICAL SPINE WITHOUT CONTRAST TECHNIQUE: Multidetector CT imaging of the maxillofacial structures was performed. Multiplanar CT image reconstructions were also generated. A small metallic BB was placed on the right temple in order to reliably differentiate right from left. Multidetector CT imaging of the cervical spine was performed without intravenous contrast. Multiplanar CT image reconstructions were also generated. COMPARISON:  Head CT dated 06/30/2019. FINDINGS: CT MAXILLOFACIAL FINDINGS Osseous: There is a displaced  fracture of the right mandibular condyle neck with lateral positioning of the mandibular condylar fracture fragment in relation to the body of the mandible and approximately 1 cm overlap. There is a mildly displaced small triangular corner fracture of the right mandibular condyle there is anterior subluxation of the right mandibular condyle in relation to the mandibular fossa. There is a nondisplaced fracture of the inferior aspect of the right maxillary sinus with extension of the fracture into the anterior and posterolateral walls. There is a minimally displaced fracture of the posterior wall of the right maxillary sinus. Orbits: The globes and retro-orbital fat are preserved. Sinuses: There is partial opacification of the right maxillary sinus with high attenuating content and air fluid level consistent with hemosinus. The mastoid air cells are clear. Soft tissues: Negative. Limited intracranial: No significant or unexpected finding. CT CERVICAL FINDINGS Alignment: No acute subluxation. Grade 1 C7-T1 anterolisthesis. Skull base and vertebrae: No acute fracture. Osteopenia. Soft tissues and spinal canal: No prevertebral fluid or swelling. No visible canal hematoma. Disc levels: Multilevel degenerative changes most  prominent at C5-C6, C6-C7, and C7-T1. Upper chest: Negative. Other: Left carotid bulb calcified plaques. IMPRESSION: 1. Displaced fractures of the right mandibular condyle neck and head as described with anterior subluxation of the right mandibular condyle in relation to the mandibular fossa. 2. Nondisplaced fractures of the wall of the right maxillary sinus with hemosinus. 3. No acute/traumatic cervical spine pathology. Electronically Signed   By: Anner Crete M.D.   On: 06/30/2019 21:27   CT Maxillofacial WO CM  Result Date: 06/30/2019 CLINICAL DATA:  73 year old female with head trauma. EXAM: CT MAXILLOFACIAL WITHOUT CONTRAST CT CERVICAL SPINE WITHOUT CONTRAST TECHNIQUE: Multidetector CT imaging of the maxillofacial structures was performed. Multiplanar CT image reconstructions were also generated. A small metallic BB was placed on the right temple in order to reliably differentiate right from left. Multidetector CT imaging of the cervical spine was performed without intravenous contrast. Multiplanar CT image reconstructions were also generated. COMPARISON:  Head CT dated 06/30/2019. FINDINGS: CT MAXILLOFACIAL FINDINGS Osseous: There is a displaced fracture of the right mandibular condyle neck with lateral positioning of the mandibular condylar fracture fragment in relation to the body of the mandible and approximately 1 cm overlap. There is a mildly displaced small triangular corner fracture of the right mandibular condyle there is anterior subluxation of the right mandibular condyle in relation to the mandibular fossa. There is a nondisplaced fracture of the inferior aspect of the right maxillary sinus with extension of the fracture into the anterior and posterolateral walls. There is a minimally displaced fracture of the posterior wall of the right maxillary sinus. Orbits: The globes and retro-orbital fat are preserved. Sinuses: There is partial opacification of the right maxillary sinus with high  attenuating content and air fluid level consistent with hemosinus. The mastoid air cells are clear. Soft tissues: Negative. Limited intracranial: No significant or unexpected finding. CT CERVICAL FINDINGS Alignment: No acute subluxation. Grade 1 C7-T1 anterolisthesis. Skull base and vertebrae: No acute fracture. Osteopenia. Soft tissues and spinal canal: No prevertebral fluid or swelling. No visible canal hematoma. Disc levels: Multilevel degenerative changes most prominent at C5-C6, C6-C7, and C7-T1. Upper chest: Negative. Other: Left carotid bulb calcified plaques. IMPRESSION: 1. Displaced fractures of the right mandibular condyle neck and head as described with anterior subluxation of the right mandibular condyle in relation to the mandibular fossa. 2. Nondisplaced fractures of the wall of the right maxillary sinus with hemosinus. 3. No acute/traumatic cervical spine  pathology. Electronically Signed   By: Anner Crete M.D.   On: 06/30/2019 21:27    EKG: Independently reviewed.  Normal sinus rhythm QTc 460 ms.  Assessment/Plan Principal Problem:   Syncope Active Problems:   Hypothyroidism   Rib fractures   Lip laceration   Closed fracture of right condylar process of mandible (HCC)    1. Total loss of consciousness likely from concussion from the fall.  EKG shows normal sinus rhythm with nonspecific changes with normal QTC.  Patient at this time does not want any cardiac work-up.  Will closely monitor get physical therapy consult very get trauma service evaluation. 2. Right-sided mandibular fracture maxillary fracture for which ENT has been consulted appreciate the consult.  On clindamycin for now.  Liquid diet.  On pain medications. 3. Rib fractures on the left side involving nondisplaced fractures of the seventh and ninth rib for which I have ordered incentive spirometry.  We will follow trauma service recommendations. 4. Hypothyroidism on Synthroid. 5. Depression on Wellbutrin and  Cymbalta.  Covid test was negative.   DVT prophylaxis: Heparin. Code Status: Full code. Family Communication: Discussed with patient. Disposition Plan: Home. Consults called: ENT and trauma. Admission status: Observation.   Rise Patience MD [facility] Pager [phone].  If 7PM-7AM, please contact night-coverage [email or web address] Password [facility]  07/01/2019, 3:23 AM

## 2019-07-01 NOTE — Evaluation (Signed)
Occupational Therapy Evaluation and Discharge Patient Details Name: Jennifer Deleon MRN: [id number] DOB: Jan 19, 1946 Today's Date: 07/01/2019    History of Present Illness Pt is a 73 y.o. admitted 06/30/19 with fall resulting in mandibular fx, L 7/9th rib fx; head CT negative for intracranial injury. Pt with (+) LOC/concussion.  PMH includes hypothyroidism, depression, osteoporosis.   Clinical Impression   This 73 yo female admitted with above presents to acute OT at a Mod I level for basic ADLs due to pain from rib fractures. No further skilled OT needs identified, we will sign off.    Follow Up Recommendations  No OT follow up;Supervision - Intermittent    Equipment Recommendations  None recommended by OT       Precautions / Restrictions Precautions Precautions: Fall Precaution Comments: Abdominal for comfort with rib fxs Restrictions Weight Bearing Restrictions: No      Mobility Bed Mobility Overal bed mobility: Independent                Transfers   Equipment used: None Transfers: Sit to/from Stand Sit to Stand: Supervision;Min guard         General transfer comment: Initial unsteadiness upon standing, pt able to self-correct without assist; progressing to supervision by end of session    Balance Overall balance assessment: Needs assistance   Sitting balance-Leahy Scale: Good       Standing balance-Leahy Scale: Good                             ADL either performed or assessed with clinical judgement   ADL Overall ADL's : Modified independent                                       General ADL Comments: Increased time due to pain from rib fractures. Pt provided with post concussion handout     Vision Patient Visual Report: No change from baseline              Pertinent Vitals/Pain Pain Assessment: Faces Faces Pain Scale: Hurts a little bit Pain Location: Ribs Pain Descriptors / Indicators: Discomfort Pain  Intervention(s): Monitored during session     Hand Dominance  right   Extremity/Trunk Assessment Upper Extremity Assessment Upper Extremity Assessment: Overall WFL for tasks assessed   Lower Extremity Assessment Lower Extremity Assessment: Overall WFL for tasks assessed       Communication Communication Communication: No difficulties   Cognition Arousal/Alertness: Awake/alert Behavior During Therapy: WFL for tasks assessed/performed Overall Cognitive Status: Within Functional Limits for tasks assessed                                     General Comments  OT provided concussion education and handout            Home Living Family/patient expects to be discharged to:: Private residence Living Arrangements: Spouse/significant other Available Help at Discharge: Family;Available 24 hours/day Type of Home: House Home Access: Stairs to enter [employer] of Steps: 3-4 Entrance Stairs-Rails: Right;Left Home Layout: Two level Alternate Level Stairs-Number of Steps: Flight Alternate Level Stairs-Rails: Right;Left Bathroom Shower/Tub: Occupational psychologist: Standard     Home Equipment: Museum/gallery conservator - 2 wheels Adaptive Equipment: Reacher  Prior Functioning/Environment Level of Independence: Independent                 OT Problem List: Pain         OT Goals(Current goals can be found in the care plan section) Acute Rehab OT Goals Patient Stated Goal: to go home today             Co-evaluation PT/OT/SLP Co-Evaluation/Treatment: Yes Reason for Co-Treatment: To address functional/ADL transfers(immient D/C)   OT goals addressed during session: ADL's and self-care;Strengthening/ROM      AM-PAC OT "6 Clicks" Daily Activity     Outcome Measure Help from another person eating meals?: None Help from another person taking care of personal grooming?: None Help from another person toileting, which  includes using toliet, bedpan, or urinal?: None Help from another person bathing (including washing, rinsing, drying)?: None Help from another person to put on and taking off regular upper body clothing?: None Help from another person to put on and taking off regular lower body clothing?: None 6 Click Score: 24   End of Session Equipment Utilized During Treatment: Gait belt  Activity Tolerance: Patient tolerated treatment well Patient left: in bed;with call bell/phone within reach;with bed alarm set  OT Visit Diagnosis: Pain Pain - Right/Left: Left Pain - part of body: (ribs)                Time: [id number] OT Time Calculation (min): 19 min Charges:  OT General Charges $OT Visit: 1 Visit OT Evaluation $OT Eval Moderate Complexity: 1 Mod  Cathy OTR/L Acute [employer] Pager [phone] Office [phone]     07/01/2019, 2:36 PM

## 2019-07-01 NOTE — Progress Notes (Signed)
73 year old lady was admitted early this morning after a fall resulting in mandibular fracture and rib fracture.  Please see H&P for details.  Patient is doing better.  Pain is controlled.  She has minimal right mandibular pain and right lower rib pain.  On exam, lungs are clear to auscultation with some decreased breath sounds at the bases.  Abdomen soft and nontender.  Seen by trauma service.  They have added incentive spirometry and as needed Robaxin.  She needs to move with physical therapy.  She continues to be in clindamycin per ENT recommendations.  Will need for 5 days.  Will need to be on full liquid diet for 4 weeks per their recommendations.

## 2019-07-01 NOTE — Consult Note (Signed)
[facility] Consult Note  Jennifer Deleon 06-Jan-1946  KW:[id number].    Requesting MD: Darliss Cheney Chief Complaint/Reason for Consult: fall  HPI:  Jennifer Deleon is a 73yo female PMH hypothyroidism and depression who was admitted to [facility] yesterday after suffering a fall at home. States that she was on her porch when she leaned forward to receive some mail and must have fallen down 3 stairs. She does not remember falling. She remember waking up in the ambulance on the way to the hospital. Main complaint is pain in her jaw, and her right rib cage. Denies SOB, headache, blurry vision, neck pain, abdominal pain, extremity pain.  Patient was worked up by the ED and found to have right mandibular condyle neck and head fracture, Nondisplaced fractures of the wall of the right maxillary sinus with hemosinus, posterior left ninth rib and lateral seventh rib fracture. CT head negative for intracranial injury. Trauma service asked to see regarding rib fractures.  Anticoagulants: none Nonsmoker Lives at home with husband Does not use assistive device for ambulation  ROS: Review of Systems  Constitutional: Negative.   HENT:       Jaw pain  Eyes: Negative.   Respiratory: Negative.  Negative for shortness of breath.   Cardiovascular: Positive for chest pain.  Gastrointestinal: Negative.   Genitourinary: Negative.   Musculoskeletal: Positive for falls. Negative for back pain and neck pain.  Skin: Negative.   Neurological: Positive for loss of consciousness.    All systems reviewed and otherwise negative except for as above  Family History  Problem Relation Age of Onset  . Osteoarthritis Mother   . Osteoarthritis Maternal Grandmother   . Osteoarthritis Father   . Hypertension Father   . Heart failure Father   . Prostate cancer Paternal Grandfather        mets to colon  . Arthritis Other   . Prostate cancer Other   . Mental illness Other     Past Medical History:  Diagnosis  Date  . Arthritis   . Asthma   . Chicken pox   . Colon polyps   . Depression   . Environmental allergies    allergies all year long  . Hypothyroid   . Osteoporosis   . UTI (lower urinary tract infection)     Past Surgical History:  Procedure Laterality Date  . CATARACT EXTRACTION Bilateral 2016  . LAMINECTOMY    . LUMBAR FUSION  2015   l4-L5  . TUBAL LIGATION      Social History:  reports that she has never smoked. She has never used smokeless tobacco. She reports current alcohol use of about 7.0 standard drinks of alcohol per week. She reports that she does not use drugs.  Allergies:  Allergies  Allergen Reactions  . Augmentin [Amoxicillin-Pot Clavulanate] Diarrhea and Other (See Comments)    Severe Stomach cramps  . Bactrim [Sulfamethoxazole-Trimethoprim] Swelling    Facial and lip swelling    Medications Prior to Admission  Medication Sig Dispense Refill  . azelastine (ASTELIN) 0.1 % nasal spray Place 1 spray into both nostrils 2 (two) times daily. Use in each nostril as directed    . beclomethasone (QVAR) 40 MCG/ACT inhaler Inhale 2 puffs into the lungs 2 (two) times daily.    Marland Kitchen buPROPion (WELLBUTRIN XL) 300 MG 24 hr tablet Take 300 mg by mouth daily.     . calcium-vitamin D (OSCAL-500) 500-400 MG-UNIT per tablet Take 1 tablet by mouth 2 (two) times daily with  a meal.    . cetirizine (ZYRTEC) 10 MG tablet Take 10 mg by mouth daily.     . diclofenac sodium (VOLTAREN) 1 % GEL Apply topically to affected area qid (Patient taking differently: Apply 2 g topically 4 (four) times daily. ) 100 g 1  . diphenhydrAMINE (BENADRYL) 25 MG tablet Take 25 mg by mouth every 6 (six) hours as needed for allergies.    Marland Kitchen docusate sodium (COLACE) 100 MG capsule Take 100 mg by mouth 2 (two) times daily as needed for mild constipation.     . DULoxetine (CYMBALTA) 60 MG capsule Take 60 mg by mouth daily.     . fexofenadine (ALLER-EASE) 180 MG tablet Take 180 mg by mouth daily.     .  fluticasone (FLONASE) 50 MCG/ACT nasal spray Place 1 spray into both nostrils 2 (two) times daily.    Marland Kitchen levothyroxine (SYNTHROID) 112 MCG tablet Take 1 tablet (112 mcg total) by mouth daily. 90 tablet 3  . montelukast (SINGULAIR) 10 MG tablet Take 10 mg by mouth at bedtime.    . Multiple Vitamin (MULTIVITAMIN WITH MINERALS) TABS Take 1 tablet by mouth daily.     Marland Kitchen omeprazole (PRILOSEC) 20 MG capsule Take 20 mg by mouth daily.    . pregabalin (LYRICA) 75 MG capsule Take 75 mg by mouth 2 (two) times daily.    . magic mouthwash SOLN Take 15 mLs by mouth 4 (four) times daily as needed for mouth pain. (Patient not taking: Reported on 06/30/2019) 240 mL 1    Prior to Admission medications   Medication Sig Start Date End Date Taking? Authorizing Provider  azelastine (ASTELIN) 0.1 % nasal spray Place 1 spray into both nostrils 2 (two) times daily. Use in each nostril as directed   Yes Tiajuana Amass, MD  beclomethasone (QVAR) 40 MCG/ACT inhaler Inhale 2 puffs into the lungs 2 (two) times daily.   Yes [provider]  buPROPion (WELLBUTRIN XL) 300 MG 24 hr tablet Take 300 mg by mouth daily.    Yes [provider]  calcium-vitamin D (OSCAL-500) 500-400 MG-UNIT per tablet Take 1 tablet by mouth 2 (two) times daily with a meal.   Yes [provider]  cetirizine (ZYRTEC) 10 MG tablet Take 10 mg by mouth daily.    Yes [provider]  diclofenac sodium (VOLTAREN) 1 % GEL Apply topically to affected area qid Patient taking differently: Apply 2 g topically 4 (four) times daily.  03/01/18  Yes Gerda Diss, DO  diphenhydrAMINE (BENADRYL) 25 MG tablet Take 25 mg by mouth every 6 (six) hours as needed for allergies.   Yes [provider]  docusate sodium (COLACE) 100 MG capsule Take 100 mg by mouth 2 (two) times daily as needed for mild constipation.    Yes [provider]  DULoxetine (CYMBALTA) 60 MG capsule Take 60 mg by mouth daily.  12/23/16  Yes [provider]  fexofenadine (ALLER-EASE) 180 MG tablet Take 180 mg by mouth daily.    Yes [provider]  fluticasone (FLONASE) 50 MCG/ACT nasal spray Place 1 spray into both nostrils 2 (two) times daily. 12/23/16  Yes [provider]  levothyroxine (SYNTHROID) 112 MCG tablet Take 1 tablet (112 mcg total) by mouth daily. 06/17/19  Yes Nafziger, Tommi Rumps, NP  montelukast (SINGULAIR) 10 MG tablet Take 10 mg by mouth at bedtime.   Yes [provider]  Multiple Vitamin (MULTIVITAMIN WITH MINERALS) TABS Take 1 tablet by mouth daily.  Yes [provider]  omeprazole (PRILOSEC) 20 MG capsule Take 20 mg by mouth daily.   Yes Leta Baptist, MD  pregabalin (LYRICA) 75 MG capsule Take 75 mg by mouth 2 (two) times daily. 03/18/19  Yes [provider]  magic mouthwash SOLN Take 15 mLs by mouth 4 (four) times daily as needed for mouth pain. Patient not taking: Reported on 06/30/2019 11/15/18   Eulas Post, MD    Blood pressure (!) 95/58, pulse 61, temperature 97.9 F (36.6 C), temperature source Oral, resp. rate 16, height 5\' 3"  (1.6 m), weight 50.8 kg, SpO2 99 %. Physical Exam: General: pleasant, WD/WN white female who is laying in bed in NAD HEENT: head is normocephalic, atraumatic.  Sclera are noninjected.  Pupils equal and round and reactive to light.  Ears and nose without any masses or lesions.  Mouth is pink and moist. Dentition fair. Mild edema in the lower lip with lacerations s/p suture repair to the lip and chin, no erythema or drainage Heart: regular, rate, and rhythm.  No obvious murmurs, gallops, or rubs noted.  Palpable pedal pulses bilaterally Lungs: CTAB, no wheezes, rhonchi, or rales noted.  Respiratory effort nonlabored. Pulling 1300 on IS Abd: soft, NT/ND, +BS, no masses, hernias, or organomegaly MS: calves soft and nontender without edema. No gross deformity Skin: warm and dry with no masses, lesions, or rashes Psych: A&Ox3 with an appropriate  affect but does seem somewhat dazed Neuro: cranial nerves grossly intact, extremity CSM intact bilaterally, normal speech. No gross motor or sensory deficits BUE/BLE  Results for orders placed or performed during the hospital encounter of 06/30/19 (from the past 48 hour(s))  CBC with Differential     Status: None   Collection Time: 06/30/19  6:32 PM  Result Value Ref Range   WBC 6.7 4.0 - 10.5 K/uL   RBC 4.62 3.87 - 5.11 MIL/uL   Hemoglobin 14.5 12.0 - 15.0 g/dL   HCT 43.8 36.0 - 46.0 %   MCV 94.8 80.0 - 100.0 fL   MCH 31.4 26.0 - 34.0 pg   MCHC 33.1 30.0 - 36.0 g/dL   RDW 13.2 11.5 - 15.5 %   Platelets 306 150 - 400 K/uL   nRBC 0.0 0.0 - 0.2 %   Neutrophils Relative % 58 %   Neutro Abs 3.9 1.7 - 7.7 K/uL   Lymphocytes Relative 29 %   Lymphs Abs 1.9 0.7 - 4.0 K/uL   Monocytes Relative 8 %   Monocytes Absolute 0.5 0.1 - 1.0 K/uL   Eosinophils Relative 3 %   Eosinophils Absolute 0.2 0.0 - 0.5 K/uL   Basophils Relative 1 %   Basophils Absolute 0.1 0.0 - 0.1 K/uL   Immature Granulocytes 1 %   Abs Immature Granulocytes 0.04 0.00 - 0.07 K/uL    Comment: Performed at [facility], [address] [place], [place] [address]  Comprehensive metabolic panel     Status: Abnormal   Collection Time: 06/30/19  6:32 PM  Result Value Ref Range   Sodium 138 135 - 145 mmol/L   Potassium 3.6 3.5 - 5.1 mmol/L   Chloride 105 98 - 111 mmol/L   CO2 22 22 - 32 mmol/L   Glucose, Bld 121 (H) 70 - 99 mg/dL   BUN 17 8 - 23 mg/dL   Creatinine, Ser 0.93 0.44 - 1.00 mg/dL   Calcium 9.7 8.9 - 10.3 mg/dL   Total Protein 6.8 6.5 - 8.1 g/dL   Albumin 4.4  3.5 - 5.0 g/dL   AST 49 (H) 15 - 41 U/L   ALT 26 0 - 44 U/L   Alkaline Phosphatase 65 38 - 126 U/L   Total Bilirubin 0.3 0.3 - 1.2 mg/dL   GFR calc non Af Amer >60 >60 mL/min   GFR calc Af Amer >60 >60 mL/min   Anion gap 11 5 - 15    Comment: Performed at [facility] [address], [place], [place] [address]  Troponin I (High  Sensitivity)     Status: Abnormal   Collection Time: 06/30/19  8:13 PM  Result Value Ref Range   Troponin I (High Sensitivity) 34 (H) <18 ng/L    Comment: (NOTE) Elevated high sensitivity troponin I (hsTnI) values and significant  changes across serial measurements may suggest ACS but many other  chronic and acute conditions are known to elevate hsTnI results.  Refer to the "Links" section for chest pain algorithms and additional  guidance. Performed at [facility], [place] [address], [place], [place] [address]   Troponin I (High Sensitivity)     Status: Abnormal   Collection Time: 06/30/19  9:53 PM  Result Value Ref Range   Troponin I (High Sensitivity) 35 (H) <18 ng/L    Comment: (NOTE) Elevated high sensitivity troponin I (hsTnI) values and significant  changes across serial measurements may suggest ACS but many other  chronic and acute conditions are known to elevate hsTnI results.  Refer to the "Links" section for chest pain algorithms and additional  guidance. Performed at [facility], [place] [address], [place], [place] [address]   SARS CORONAVIRUS 2 (TAT 6-24 HRS) Nasopharyngeal Nasopharyngeal Swab     Status: None   Collection Time: 06/30/19 11:11 PM   Specimen: Nasopharyngeal Swab  Result Value Ref Range   SARS Coronavirus 2 NEGATIVE NEGATIVE    Comment: (NOTE) SARS-CoV-2 target nucleic acids are NOT DETECTED. The SARS-CoV-2 RNA is generally detectable in upper and lower respiratory specimens during the acute phase of infection. Negative results do not preclude SARS-CoV-2 infection, do not rule out co-infections with other pathogens, and should not be used as the sole basis for treatment or other patient management decisions. Negative results must be combined with clinical observations, patient history, and epidemiological information. The expected result is Negative. Fact Sheet for Patients: SugarRoll.be Fact Sheet for  Healthcare Providers: https://www.woods-mathews.com/ This test is not yet approved or cleared by the [place] FDA and  has been authorized for detection and/or diagnosis of SARS-CoV-2 by FDA under an Emergency Use Authorization (EUA). This EUA will remain  in effect (meaning this test can be used) for the duration of the COVID-19 declaration under Section 56 4(b)(1) of the Act, 21 U.S.C. section 360bbb-3(b)(1), unless the authorization is terminated or revoked sooner. Performed at [facility], [place] [address] [place], [place] [id number]   Basic metabolic panel     Status: Abnormal   Collection Time: 07/01/19  3:49 AM  Result Value Ref Range   Sodium 140 135 - 145 mmol/L   Potassium 3.9 3.5 - 5.1 mmol/L   Chloride 104 98 - 111 mmol/L   CO2 24 22 - 32 mmol/L   Glucose, Bld 135 (H) 70 - 99 mg/dL   BUN 13 8 - 23 mg/dL   Creatinine, Ser 0.77 0.44 - 1.00 mg/dL   Calcium 9.2 8.9 - 10.3 mg/dL   GFR calc non Af Amer >60 >60 mL/min   GFR calc  Af Amer >60 >60 mL/min   Anion gap 12 5 - 15    Comment: Performed at [facility] [address], [place], [place] [address]  CBC     Status: Abnormal   Collection Time: 07/01/19  3:49 AM  Result Value Ref Range   WBC 11.0 (H) 4.0 - 10.5 K/uL   RBC 4.42 3.87 - 5.11 MIL/uL   Hemoglobin 13.8 12.0 - 15.0 g/dL   HCT 41.5 36.0 - 46.0 %   MCV 93.9 80.0 - 100.0 fL   MCH 31.2 26.0 - 34.0 pg   MCHC 33.3 30.0 - 36.0 g/dL   RDW 13.2 11.5 - 15.5 %   Platelets 268 150 - 400 K/uL   nRBC 0.0 0.0 - 0.2 %    Comment: Performed at [facility], [place] [address], [place], [place] [address]   DG Chest 2 View  Result Date: 06/30/2019 CLINICAL DATA:  Fall with bilateral rib pain for EXAM: CHEST - 2 VIEW COMPARISON:  None. FINDINGS: The heart size and mediastinal contours are within normal limits. Both lungs are clear. There is nondisplaced fracture of the lateral seventh right rib and posterior left ninth rib. This is somewhat  limited due to diffuse osteopenia. IMPRESSION: No acute cardiopulmonary process. Nondisplaced posterior left ninth rib and lateral seventh rib fracture. Electronically Signed   By: Prudencio Pair M.D.   On: 06/30/2019 21:05   CT Head Wo Contrast  Result Date: 06/30/2019 CLINICAL DATA:  Fall at home. Chin laceration with right-sided headaches. Limited memory of injury. EXAM: CT HEAD WITHOUT CONTRAST TECHNIQUE: Contiguous axial images were obtained from the base of the skull through the vertex without intravenous contrast. COMPARISON:  CT head 08/29/2016. FINDINGS: Brain: There is no evidence of acute intracranial hemorrhage, mass lesion, brain edema or extra-axial fluid collection. The ventricles and subarachnoid spaces are appropriately sized for age. There is no CT evidence of acute cortical infarction. Stable probable chronic calcified meningioma in the left frontotemporal region. Vascular: Mild intracranial atherosclerosis. No hyperdense vessel identified. Skull: Calvarial hyperostosis without evidence of acute fracture. Sinuses/Orbits: There is an air-fluid level within the right maxillary sinus with a possible nondisplaced fracture involving the floor of the sinus, partially imaged on image 1/3. There is a displaced fracture of the right condylar neck with resulting dislocation at the right temporomandibular joint. Advanced TMJ degenerative changes are present bilaterally. The visualized paranasal sinuses are otherwise clear. No orbital hematoma. Previous bilateral lens surgery. Other: None. IMPRESSION: 1. No acute intracranial or calvarial findings. 2. Displaced fracture of the right condylar neck with resulting dislocation at the right temporomandibular joint. Air-fluid level in the right maxillary sinus with possible nondisplaced fracture involving the floor of the sinus, incompletely visualized. Recommend further evaluation with dedicated maxillofacial CT. Electronically Signed   By: Richardean Sale M.D.    On: 06/30/2019 18:56   CT Cervical Spine Wo Contrast  Result Date: 06/30/2019 CLINICAL DATA:  73 year old female with head trauma. EXAM: CT MAXILLOFACIAL WITHOUT CONTRAST CT CERVICAL SPINE WITHOUT CONTRAST TECHNIQUE: Multidetector CT imaging of the maxillofacial structures was performed. Multiplanar CT image reconstructions were also generated. A small metallic BB was placed on the right temple in order to reliably differentiate right from left. Multidetector CT imaging of the cervical spine was performed without intravenous contrast. Multiplanar CT image reconstructions were also generated. COMPARISON:  Head CT dated 06/30/2019. FINDINGS: CT MAXILLOFACIAL FINDINGS Osseous: There is a displaced fracture of the right mandibular condyle neck with lateral  positioning of the mandibular condylar fracture fragment in relation to the body of the mandible and approximately 1 cm overlap. There is a mildly displaced small triangular corner fracture of the right mandibular condyle there is anterior subluxation of the right mandibular condyle in relation to the mandibular fossa. There is a nondisplaced fracture of the inferior aspect of the right maxillary sinus with extension of the fracture into the anterior and posterolateral walls. There is a minimally displaced fracture of the posterior wall of the right maxillary sinus. Orbits: The globes and retro-orbital fat are preserved. Sinuses: There is partial opacification of the right maxillary sinus with high attenuating content and air fluid level consistent with hemosinus. The mastoid air cells are clear. Soft tissues: Negative. Limited intracranial: No significant or unexpected finding. CT CERVICAL FINDINGS Alignment: No acute subluxation. Grade 1 C7-T1 anterolisthesis. Skull base and vertebrae: No acute fracture. Osteopenia. Soft tissues and spinal canal: No prevertebral fluid or swelling. No visible canal hematoma. Disc levels: Multilevel degenerative changes most  prominent at C5-C6, C6-C7, and C7-T1. Upper chest: Negative. Other: Left carotid bulb calcified plaques. IMPRESSION: 1. Displaced fractures of the right mandibular condyle neck and head as described with anterior subluxation of the right mandibular condyle in relation to the mandibular fossa. 2. Nondisplaced fractures of the wall of the right maxillary sinus with hemosinus. 3. No acute/traumatic cervical spine pathology. Electronically Signed   By: Anner Crete M.D.   On: 06/30/2019 21:27   CT Maxillofacial WO CM  Result Date: 06/30/2019 CLINICAL DATA:  73 year old female with head trauma. EXAM: CT MAXILLOFACIAL WITHOUT CONTRAST CT CERVICAL SPINE WITHOUT CONTRAST TECHNIQUE: Multidetector CT imaging of the maxillofacial structures was performed. Multiplanar CT image reconstructions were also generated. A small metallic BB was placed on the right temple in order to reliably differentiate right from left. Multidetector CT imaging of the cervical spine was performed without intravenous contrast. Multiplanar CT image reconstructions were also generated. COMPARISON:  Head CT dated 06/30/2019. FINDINGS: CT MAXILLOFACIAL FINDINGS Osseous: There is a displaced fracture of the right mandibular condyle neck with lateral positioning of the mandibular condylar fracture fragment in relation to the body of the mandible and approximately 1 cm overlap. There is a mildly displaced small triangular corner fracture of the right mandibular condyle there is anterior subluxation of the right mandibular condyle in relation to the mandibular fossa. There is a nondisplaced fracture of the inferior aspect of the right maxillary sinus with extension of the fracture into the anterior and posterolateral walls. There is a minimally displaced fracture of the posterior wall of the right maxillary sinus. Orbits: The globes and retro-orbital fat are preserved. Sinuses: There is partial opacification of the right maxillary sinus with high  attenuating content and air fluid level consistent with hemosinus. The mastoid air cells are clear. Soft tissues: Negative. Limited intracranial: No significant or unexpected finding. CT CERVICAL FINDINGS Alignment: No acute subluxation. Grade 1 C7-T1 anterolisthesis. Skull base and vertebrae: No acute fracture. Osteopenia. Soft tissues and spinal canal: No prevertebral fluid or swelling. No visible canal hematoma. Disc levels: Multilevel degenerative changes most prominent at C5-C6, C6-C7, and C7-T1. Upper chest: Negative. Other: Left carotid bulb calcified plaques. IMPRESSION: 1. Displaced fractures of the right mandibular condyle neck and head as described with anterior subluxation of the right mandibular condyle in relation to the mandibular fossa. 2. Nondisplaced fractures of the wall of the right maxillary sinus with hemosinus. 3. No acute/traumatic cervical spine pathology. Electronically Signed   By: Anner Crete  M.D.   On: 06/30/2019 21:27      Assessment/Plan Fall LOC/concussion - TBI team therapies Lip/chin lacerations - s/p repair by ED R mandibular condyle neck and head fx - per ENT Nondisplaced fxs of the wall of the R maxillary sinus with hemosinus - per ENT Posterior L 9th rib and lateral 7th rib fx - pain control/pulm toilet Hypothyroidism Depression  ID - none VTE - SCDs, sq heparin FEN - FLD, IVF Foley - none Follow up - ENT, PCP  Plan - Trauma asked to see regarding rib fractures and concussion. Overall pain seems fairly well controlled, although she has not gotten OOB. Add robaxin PRN. Needs to mobilize. Recommend TBI team therapies. Continue pulmonary toilet with IS.   Wellington Hampshire, [facility] 07/01/2019, 9:07 AM Please see Amion for pager number during day hours 7:00am-4:30pm

## 2019-07-01 NOTE — Progress Notes (Signed)
  Echocardiogram 2D Echocardiogram has been performed.  Jennette Dubin 07/01/2019, 11:46 AM

## 2019-07-02 MED ORDER — CLINDAMYCIN HCL 150 MG PO CAPS: 150.0000 mg | ORAL_CAPSULE | Freq: Four times a day (QID) | ORAL | 0 refills | Status: DC

## 2019-07-02 MED ORDER — METHOCARBAMOL 500 MG PO TABS: 500.0000 mg | ORAL_TABLET | Freq: Three times a day (TID) | ORAL | 1 refills | Status: DC | PRN

## 2019-07-02 MED ORDER — OXYCODONE HCL 5 MG PO TABS: 2.5000 mg | ORAL_TABLET | ORAL | 0 refills | Status: DC | PRN

## 2019-07-02 NOTE — Progress Notes (Signed)
AVS reviewed with patient and patient given a copy to take home. Patient dressed, lines removed, belongings packed, and patient taken via wheelchair to husband's car for discharge.

## 2019-07-02 NOTE — Discharge Summary (Signed)
Physician Discharge Summary  Patient ID: Jennifer Deleon MRN: [id number] DOB/AGE: Aug 31, 1945 73 y.o.  Admit date: 06/30/2019 Discharge date: 07/02/2019   Discharge Diagnoses:  Principal Problem:   Syncope Active Problems:   Hypothyroidism   Rib fractures   Lip laceration   Closed fracture of right condylar process of mandible (HCC)   Mandibular fracture, closed (HCC)   Consults: Trauma, ENT, PT, OT  Significant Diagnostic Studies:   DG Chest 2 View  Result Date: 06/30/2019 CLINICAL DATA:  Fall with bilateral rib pain for EXAM: CHEST - 2 VIEW COMPARISON:  None. FINDINGS: The heart size and mediastinal contours are within normal limits. Both lungs are clear. There is nondisplaced fracture of the lateral seventh right rib and posterior left ninth rib. This is somewhat limited due to diffuse osteopenia. IMPRESSION: No acute cardiopulmonary process. Nondisplaced posterior left ninth rib and lateral seventh rib fracture. Electronically Signed   By: Prudencio Pair M.D.   On: 06/30/2019 21:05   CT Head Wo Contrast  Result Date: 06/30/2019 CLINICAL DATA:  Fall at home. Chin laceration with right-sided headaches. Limited memory of injury. EXAM: CT HEAD WITHOUT CONTRAST TECHNIQUE: Contiguous axial images were obtained from the base of the skull through the vertex without intravenous contrast. COMPARISON:  CT head 08/29/2016. FINDINGS: Brain: There is no evidence of acute intracranial hemorrhage, mass lesion, brain edema or extra-axial fluid collection. The ventricles and subarachnoid spaces are appropriately sized for age. There is no CT evidence of acute cortical infarction. Stable probable chronic calcified meningioma in the left frontotemporal region. Vascular: Mild intracranial atherosclerosis. No hyperdense vessel identified. Skull: Calvarial hyperostosis without evidence of acute fracture. Sinuses/Orbits: There is an air-fluid level within the right maxillary sinus with a possible nondisplaced  fracture involving the floor of the sinus, partially imaged on image 1/3. There is a displaced fracture of the right condylar neck with resulting dislocation at the right temporomandibular joint. Advanced TMJ degenerative changes are present bilaterally. The visualized paranasal sinuses are otherwise clear. No orbital hematoma. Previous bilateral lens surgery. Other: None. IMPRESSION: 1. No acute intracranial or calvarial findings. 2. Displaced fracture of the right condylar neck with resulting dislocation at the right temporomandibular joint. Air-fluid level in the right maxillary sinus with possible nondisplaced fracture involving the floor of the sinus, incompletely visualized. Recommend further evaluation with dedicated maxillofacial CT. Electronically Signed   By: Richardean Sale M.D.   On: 06/30/2019 18:56   CT Cervical Spine Wo Contrast  Result Date: 06/30/2019 CLINICAL DATA:  73 year old female with head trauma. EXAM: CT MAXILLOFACIAL WITHOUT CONTRAST CT CERVICAL SPINE WITHOUT CONTRAST TECHNIQUE: Multidetector CT imaging of the maxillofacial structures was performed. Multiplanar CT image reconstructions were also generated. A small metallic BB was placed on the right temple in order to reliably differentiate right from left. Multidetector CT imaging of the cervical spine was performed without intravenous contrast. Multiplanar CT image reconstructions were also generated. COMPARISON:  Head CT dated 06/30/2019. FINDINGS: CT MAXILLOFACIAL FINDINGS Osseous: There is a displaced fracture of the right mandibular condyle neck with lateral positioning of the mandibular condylar fracture fragment in relation to the body of the mandible and approximately 1 cm overlap. There is a mildly displaced small triangular corner fracture of the right mandibular condyle there is anterior subluxation of the right mandibular condyle in relation to the mandibular fossa. There is a nondisplaced fracture of the inferior aspect  of the right maxillary sinus with extension of the fracture into the anterior and posterolateral walls. There is  a minimally displaced fracture of the posterior wall of the right maxillary sinus. Orbits: The globes and retro-orbital fat are preserved. Sinuses: There is partial opacification of the right maxillary sinus with high attenuating content and air fluid level consistent with hemosinus. The mastoid air cells are clear. Soft tissues: Negative. Limited intracranial: No significant or unexpected finding. CT CERVICAL FINDINGS Alignment: No acute subluxation. Grade 1 C7-T1 anterolisthesis. Skull base and vertebrae: No acute fracture. Osteopenia. Soft tissues and spinal canal: No prevertebral fluid or swelling. No visible canal hematoma. Disc levels: Multilevel degenerative changes most prominent at C5-C6, C6-C7, and C7-T1. Upper chest: Negative. Other: Left carotid bulb calcified plaques. IMPRESSION: 1. Displaced fractures of the right mandibular condyle neck and head as described with anterior subluxation of the right mandibular condyle in relation to the mandibular fossa. 2. Nondisplaced fractures of the wall of the right maxillary sinus with hemosinus. 3. No acute/traumatic cervical spine pathology. Electronically Signed   By: Anner Crete M.D.   On: 06/30/2019 21:27   ECHOCARDIOGRAM COMPLETE  Result Date: 07/01/2019   ECHOCARDIOGRAM REPORT   [name] Date of Exam: 07/01/2019 Medical Rec #:  [id number]      Height:       63.0 in Accession #:    [id number]     Weight:       112.0 lb Date of Birth:  [birth year]      BSA:          1.51 m Patient Age:    25 years       BP:           95/58 mmHg Patient Gender: F              HR:           59 bpm. Exam Location:  Inpatient Procedure: 2D Echo Indications:    Syncope  History:        Patient has no prior history of Echocardiogram examinations.  Sonographer:    Mikki Santee RDCS (AE) Referring Phys: [id number] RAVI PAHWANI IMPRESSIONS  1.  Left ventricular ejection fraction, by visual estimation, is 60 to 65%. The left ventricle has normal function. There is no left ventricular hypertrophy.  2. Left ventricular diastolic parameters are consistent with Grade I diastolic dysfunction (impaired relaxation).  3. The left ventricle has no regional wall motion abnormalities.  4. Global right ventricle has mildly reduced systolic function.The right ventricular size is mildly enlarged. No increase in right ventricular wall thickness.  5. Left atrial size was normal.  6. Right atrial size was mildly dilated.  7. The mitral valve is grossly normal. Trivial mitral valve regurgitation.  8. The tricuspid valve is grossly normal.  9. The aortic valve is tricuspid. Aortic valve regurgitation is not visualized. No evidence of aortic valve sclerosis or stenosis. 10. The pulmonic valve was grossly normal. Pulmonic valve regurgitation is trivial. 11. Normal pulmonary artery systolic pressure. 12. The inferior vena cava is normal in size with greater than 50% respiratory variability, suggesting right atrial pressure of 3 mmHg. FINDINGS  Left Ventricle: Left ventricular ejection fraction, by visual estimation, is 60 to 65%. The left ventricle has normal function. The left ventricle has no regional wall motion abnormalities. The left ventricular internal cavity size was the left ventricle is normal in size. There is no left ventricular hypertrophy. Left ventricular diastolic parameters are consistent with Grade I diastolic dysfunction (impaired relaxation). Normal left atrial pressure. Right Ventricle: The right ventricular  size is mildly enlarged. No increase in right ventricular wall thickness. Global RV systolic function is has mildly reduced systolic function. The tricuspid regurgitant velocity is 1.89 m/s, and with an assumed right atrial pressure of 3 mmHg, the estimated right ventricular systolic pressure is normal at 17.3 mmHg. Left Atrium: Left atrial size was  normal in size. Right Atrium: Right atrial size was mildly dilated Pericardium: There is no evidence of pericardial effusion. Mitral Valve: The mitral valve is grossly normal. Trivial mitral valve regurgitation. Tricuspid Valve: The tricuspid valve is grossly normal. Tricuspid valve regurgitation is not demonstrated. Aortic Valve: The aortic valve is tricuspid. Aortic valve regurgitation is not visualized. The aortic valve is structurally normal, with no evidence of sclerosis or stenosis. Pulmonic Valve: The pulmonic valve was grossly normal. Pulmonic valve regurgitation is trivial. Pulmonic regurgitation is trivial. Aorta: The aortic root is normal in size and structure. Venous: The inferior vena cava is normal in size with greater than 50% respiratory variability, suggesting right atrial pressure of 3 mmHg. IAS/Shunts: No atrial level shunt detected by color flow Doppler.  LEFT VENTRICLE PLAX 2D LVIDd:         3.90 cm  Diastology LVIDs:         2.50 cm  LV e' lateral:   9.57 cm/s LV PW:         0.80 cm  LV E/e' lateral: 7.6 LV IVS:        0.80 cm  LV e' medial:    7.51 cm/s LVOT diam:     1.80 cm  LV E/e' medial:  9.6 LV SV:         44 ml LV SV Index:   29.06 LVOT Area:     2.54 cm  RIGHT VENTRICLE RV S prime:     7.51 cm/s TAPSE (M-mode): 1.3 cm LEFT ATRIUM           Index       RIGHT ATRIUM           Index LA diam:      2.40 cm 1.59 cm/m  RA Area:     13.30 cm LA Vol (A2C): 35.6 ml 23.56 ml/m RA Volume:   33.20 ml  21.97 ml/m LA Vol (A4C): 17.7 ml 11.71 ml/m  AORTIC VALVE LVOT Vmax:   95.80 cm/s LVOT Vmean:  63.600 cm/s LVOT VTI:    0.221 m  AORTA Ao Root diam: 2.50 cm MITRAL VALVE                         TRICUSPID VALVE MV Area (PHT): 2.99 cm              TR Peak grad:   14.3 mmHg MV PHT:        73.66 msec            TR Vmax:        198.00 cm/s MV Decel Time: 254 msec MV E velocity: 72.40 cm/s  103 cm/s  SHUNTS MV A velocity: 101.00 cm/s 70.3 cm/s Systemic VTI:  0.22 m MV E/A ratio:  0.72        1.5        Systemic Diam: 1.80 cm  Kate Sable MD Electronically signed by Kate Sable MD Signature Date/Time: 07/01/2019/12:00:50 PM    Final    CT Maxillofacial WO CM  Result Date: 06/30/2019 CLINICAL DATA:  73 year old female with head trauma. EXAM: CT MAXILLOFACIAL WITHOUT CONTRAST CT CERVICAL  SPINE WITHOUT CONTRAST TECHNIQUE: Multidetector CT imaging of the maxillofacial structures was performed. Multiplanar CT image reconstructions were also generated. A small metallic BB was placed on the right temple in order to reliably differentiate right from left. Multidetector CT imaging of the cervical spine was performed without intravenous contrast. Multiplanar CT image reconstructions were also generated. COMPARISON:  Head CT dated 06/30/2019. FINDINGS: CT MAXILLOFACIAL FINDINGS Osseous: There is a displaced fracture of the right mandibular condyle neck with lateral positioning of the mandibular condylar fracture fragment in relation to the body of the mandible and approximately 1 cm overlap. There is a mildly displaced small triangular corner fracture of the right mandibular condyle there is anterior subluxation of the right mandibular condyle in relation to the mandibular fossa. There is a nondisplaced fracture of the inferior aspect of the right maxillary sinus with extension of the fracture into the anterior and posterolateral walls. There is a minimally displaced fracture of the posterior wall of the right maxillary sinus. Orbits: The globes and retro-orbital fat are preserved. Sinuses: There is partial opacification of the right maxillary sinus with high attenuating content and air fluid level consistent with hemosinus. The mastoid air cells are clear. Soft tissues: Negative. Limited intracranial: No significant or unexpected finding. CT CERVICAL FINDINGS Alignment: No acute subluxation. Grade 1 C7-T1 anterolisthesis. Skull base and vertebrae: No acute fracture. Osteopenia. Soft tissues and spinal  canal: No prevertebral fluid or swelling. No visible canal hematoma. Disc levels: Multilevel degenerative changes most prominent at C5-C6, C6-C7, and C7-T1. Upper chest: Negative. Other: Left carotid bulb calcified plaques. IMPRESSION: 1. Displaced fractures of the right mandibular condyle neck and head as described with anterior subluxation of the right mandibular condyle in relation to the mandibular fossa. 2. Nondisplaced fractures of the wall of the right maxillary sinus with hemosinus. 3. No acute/traumatic cervical spine pathology. Electronically Signed   By: Anner Crete M.D.   On: 06/30/2019 21:27   Lab results: CBC Latest Ref Rng & Units 07/01/2019 06/30/2019 12/06/2017  WBC 4.0 - 10.5 K/uL 11.0(H) 6.7 4.0  Hemoglobin 12.0 - 15.0 g/dL 13.8 14.5 14.6  Hematocrit 36.0 - 46.0 % 41.5 43.8 42.6  Platelets 150 - 400 K/uL 268 306 80(L)   CMP Latest Ref Rng & Units 07/01/2019 06/30/2019 12/06/2017  Glucose 70 - 99 mg/dL 135(H) 121(H) 135(H)  BUN 8 - 23 mg/dL 13 17 14   Creatinine 0.44 - 1.00 mg/dL 0.77 0.93 0.83  Sodium 135 - 145 mmol/L 140 138 137  Potassium 3.5 - 5.1 mmol/L 3.9 3.6 3.3(L)  Chloride 98 - 111 mmol/L 104 105 102  CO2 22 - 32 mmol/L 24 22 24   Calcium 8.9 - 10.3 mg/dL 9.2 9.7 9.0  Total Protein 6.5 - 8.1 g/dL - 6.8 -  Total Bilirubin 0.3 - 1.2 mg/dL - 0.3 -  Alkaline Phos 38 - 126 U/L - 65 -  AST 15 - 41 U/L - 49(H) -  ALT 0 - 44 U/L - 26 -   Lab Results  Component Value Date   SARSCOV2NAA NEGATIVE 06/30/2019   Stable troponins 34, 35 Nml EKG  Hospital Course: Jennifer Deleon is a 72 y.o. female with history of hypothyroidism was brought to the ER after patient had a fall at home.  Patient states she was on the porch and climbing down the steps to receive a mail when she leaned too far forward and suddenly fell and the next thing she remembers was that she was being brought to the hospital  in the ambulance.  Denies any chest pain dizziness palpitation or shortness of  breath prior to the fall.  ED Course: In the ER patient had CT head and CT maxillofacial and C-spine which shows right maxillary and mandibular fracture for which ENT has been consulted.  X-ray of the chest also shows seventh and ninth rib nondisplaced fractures.  Given that patient had total loss of consciousness after the fall concerning for syncope patient was admitted for further observation.  EKG shows normal sinus rhythm with nonspecific ST-T changes and QTC of 460 ms.  Labs show high sensitive troponin of 34 and 35.  Covid test was negative. Normal ECHO. Admitted overnight. Decided to forgo surgical repair of mandible fracture. Eval and signed off by PT and OT and stable, no dizziness with walking. Placed on Clindamycin to continue this for 5 days per ENT.Incentive spirometry per trauma due to rib fractures. 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 Desires to be sent home. Disposition: Discharge disposition: 01-Home or Self Care      Discharged Condition: stable  Discharge Instructions    Call MD for:  difficulty breathing, headache or visual disturbances   Complete by: As directed    Call MD for:  persistant dizziness or light-headedness   Complete by: As directed    Call MD for:  severe uncontrolled pain   Complete by: As directed    Call MD for:  temperature >100.4   Complete by: As directed    Diet - low sodium heart healthy   Complete by: As directed    Increase activity slowly   Complete by: As directed      Allergies as of 07/02/2019      Reactions   Augmentin [amoxicillin-pot Clavulanate] Diarrhea, Other (See Comments)   Severe Stomach cramps   Bactrim [sulfamethoxazole-trimethoprim] Swelling   Facial and lip swelling      Medication List    TAKE these medications   Aller-Ease 180 MG tablet Generic drug: fexofenadine Take 180 mg by mouth daily.   azelastine 0.1 % nasal spray Commonly known as: ASTELIN Place 1 spray into both nostrils 2  (two) times daily. Use in each nostril as directed   beclomethasone 40 MCG/ACT inhaler Commonly known as: QVAR Inhale 2 puffs into the lungs 2 (two) times daily.   buPROPion 300 MG 24 hr tablet Commonly known as: WELLBUTRIN XL Take 300 mg by mouth daily.   calcium-vitamin D 500-400 MG-UNIT tablet Commonly known as: OSCAL-500 Take 1 tablet by mouth 2 (two) times daily with a meal.   cetirizine 10 MG tablet Commonly known as: ZYRTEC Take 10 mg by mouth daily.   clindamycin 150 MG capsule Commonly known as: Cleocin Take 1 capsule (150 mg total) by mouth 4 (four) times daily for 4 days.   diclofenac sodium 1 % Gel Commonly known as: Voltaren Apply topically to affected area qid What changed:   how much to take  how to take this  when to take this  additional instructions   diphenhydrAMINE 25 MG tablet Commonly known as: BENADRYL Take 25 mg by mouth every 6 (six) hours as needed for allergies.   docusate sodium 100 MG capsule Commonly known as: COLACE Take 100 mg by mouth 2 (two) times daily as needed for mild constipation.   DULoxetine 60 MG capsule Commonly known as: CYMBALTA Take 60 mg by mouth daily.   fluticasone 50 MCG/ACT nasal spray Commonly known as: FLONASE Place 1 spray into both nostrils  2 (two) times daily.   levothyroxine 112 MCG tablet Commonly known as: SYNTHROID Take 1 tablet (112 mcg total) by mouth daily.   magic mouthwash Soln Take 15 mLs by mouth 4 (four) times daily as needed for mouth pain.   methocarbamol 500 MG tablet Commonly known as: ROBAXIN Take 1 tablet (500 mg total) by mouth every 8 (eight) hours as needed for muscle spasms.   montelukast 10 MG tablet Commonly known as: SINGULAIR Take 10 mg by mouth at bedtime.   multivitamin with minerals Tabs tablet Take 1 tablet by mouth daily.   omeprazole 20 MG capsule Commonly known as: PRILOSEC Take 20 mg by mouth daily.   oxyCODONE 5 MG immediate release tablet Commonly known  as: Oxy IR/ROXICODONE Take 0.5-1 tablets (2.5-5 mg total) by mouth every 4 (four) hours as needed for severe pain.   pregabalin 75 MG capsule Commonly known as: LYRICA Take 75 mg by mouth 2 (two) times daily.      Follow-up Information    Helayne Seminole, MD. Schedule an appointment as soon as possible for a visit in 1 week(s).   Specialty: Otolaryngology Why: for woudn recheck and recheck of your fracture.  Contact information: [address] [place] [address] [phone]        [name], NP Follow up.   Specialty: Family Medicine Contact information: [address] [place] [address] [phone]           Signed: Donnamae Jude 07/02/2019, 3:54 PM

## 2019-07-04 ENCOUNTER — Telehealth: Payer: Self-pay

## 2019-07-04 NOTE — Telephone Encounter (Signed)
Left message on machine with follow up appointment 07/12/19 8:00am and to call our office if she has any concerns or questions.

## 2019-07-11 ENCOUNTER — Other Ambulatory Visit: Payer: Self-pay

## 2019-07-12 ENCOUNTER — Ambulatory Visit (INDEPENDENT_AMBULATORY_CARE_PROVIDER_SITE_OTHER): Payer: Medicare Other | Admitting: Adult Health

## 2019-07-12 ENCOUNTER — Encounter: Payer: Self-pay | Admitting: Adult Health

## 2019-07-12 VITALS — BP 118/70 | HR 68 | Temp 97.3°F | Ht 63.0 in | Wt 115.0 lb

## 2019-07-12 DIAGNOSIS — B370 Candidal stomatitis: Secondary | ICD-10-CM | POA: Diagnosis not present

## 2019-07-12 MED ORDER — MAGIC MOUTHWASH W/LIDOCAINE: 5.0000 mL | Freq: Three times a day (TID) | ORAL | 1 refills | Status: DC | PRN

## 2019-07-12 NOTE — Progress Notes (Signed)
Subjective:    Patient ID: Jennifer Deleon, female    DOB: Apr 08, 1946, 74 y.o.   MRN: [id number]  HPI 74 year old female who  has a past medical history of Arthritis, Asthma, Chicken pox, Colon polyps, Depression, Environmental allergies, Hypothyroid, Osteoporosis, and UTI (lower urinary tract infection).  She presents to the office today for an acute issue of "sores in my mouth".  Symptoms started about 3 weeks ago included areas underneath the tongue, on top of her mouth, in her throat, and on the inside of her cheeks.  She started using leftover Magic mouthwash and her symptoms improved but she continues to have some soreness.  She does report areas of erythremia with a white coating as well as canker sores.  He has not been on any antibiotics recently.  She was admitted to the hospital 06/30/2019 through 07/01/2019 after sustaining a fall at home.  Imaging at the hospital showed a right maxillary and mandible did Bealer fracture as well as seventh and ninth rib nondisplaced fractures.  ENT was consulted and advised no surgical intervention at this time.   Review of Systems See HPI   Past Medical History:  Diagnosis Date  . Arthritis   . Asthma   . Chicken pox   . Colon polyps   . Depression   . Environmental allergies    allergies all year long  . Hypothyroid   . Osteoporosis   . UTI (lower urinary tract infection)     Social History   Socioeconomic History  . Marital status: Married    Spouse name: Not on file  . Number of children: Not on file  . Years of education: Not on file  . Highest education level: Not on file  Occupational History  . Not on file  Tobacco Use  . Smoking status: Never Smoker  . Smokeless tobacco: Never Used  Substance and Sexual Activity  . Alcohol use: Yes    Alcohol/week: 7.0 standard drinks    Types: 7 Glasses of wine per week    Comment: a glass of wine with dinner each night  . Drug use: No  . Sexual activity: Not on file  Other  Topics Concern  . Not on file  Social History Narrative   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ighter    Married for  11 years    [place] who live in [place] Strain:   . Difficulty of Paying Living Expenses: Not on file  Food Insecurity:   . Worried About Charity fundraiser in the Last Year: Not on file  . Ran Out of Food in the Last Year: Not on file  Transportation Needs:   . Lack of Transportation (Medical): Not on file  . Lack of Transportation (Non-Medical): Not on file  Physical Activity:   . Days of Exercise per Week: Not on file  . Minutes of Exercise per Session: Not on file  Stress:   . Feeling of Stress : Not on file  Social Connections:   . Frequency of Communication with Friends and Family: Not on file  . Frequency of Social Gatherings with Friends and Family: Not on file  . Attends Religious Services: Not on file  . Active Member of Clubs or Organizations: Not on file  . Attends Archivist Meetings: Not on file  . Marital Status: Not on file  Intimate Partner Violence:   . Fear of Current  or Ex-Partner: Not on file  . Emotionally Abused: Not on file  . Physically Abused: Not on file  . Sexually Abused: Not on file    Past Surgical History:  Procedure Laterality Date  . CATARACT EXTRACTION Bilateral 2016  . LAMINECTOMY    . LUMBAR FUSION  2015   l4-L5  . TUBAL LIGATION      Family History  Problem Relation Age of Onset  . Osteoarthritis Mother   . Osteoarthritis Maternal Grandmother   . Osteoarthritis Father   . Hypertension Father   . Heart failure Father   . Prostate cancer Paternal Grandfather        mets to colon  . Arthritis Other   . Prostate cancer Other   . Mental illness Other     Allergies  Allergen Reactions  . Augmentin [Amoxicillin-Pot Clavulanate] Diarrhea and Other (See Comments)    Severe Stomach cramps  . Bactrim [Sulfamethoxazole-Trimethoprim] Swelling    Facial and  lip swelling    Current Outpatient Medications on File Prior to Visit  Medication Sig Dispense Refill  . azelastine (ASTELIN) 0.1 % nasal spray Place 1 spray into both nostrils 2 (two) times daily. Use in each nostril as directed    . beclomethasone (QVAR) 40 MCG/ACT inhaler Inhale 2 puffs into the lungs 2 (two) times daily.    Marland Kitchen buPROPion (WELLBUTRIN XL) 300 MG 24 hr tablet Take 300 mg by mouth daily.     . calcium-vitamin D (OSCAL-500) 500-400 MG-UNIT per tablet Take 1 tablet by mouth 2 (two) times daily with a meal.    . cetirizine (ZYRTEC) 10 MG tablet Take 10 mg by mouth daily.     . diclofenac sodium (VOLTAREN) 1 % GEL Apply topically to affected area qid (Patient taking differently: Apply 2 g topically 4 (four) times daily. ) 100 g 1  . diphenhydrAMINE (BENADRYL) 25 MG tablet Take 25 mg by mouth every 6 (six) hours as needed for allergies.    Marland Kitchen docusate sodium (COLACE) 100 MG capsule Take 100 mg by mouth 2 (two) times daily as needed for mild constipation.     . DULoxetine (CYMBALTA) 60 MG capsule Take 60 mg by mouth daily.     . fexofenadine (ALLER-EASE) 180 MG tablet Take 180 mg by mouth daily.     . fluticasone (FLONASE) 50 MCG/ACT nasal spray Place 1 spray into both nostrils 2 (two) times daily.    Marland Kitchen levothyroxine (SYNTHROID) 112 MCG tablet Take 1 tablet (112 mcg total) by mouth daily. 90 tablet 3  . magic mouthwash SOLN Take 15 mLs by mouth 4 (four) times daily as needed for mouth pain. 240 mL 1  . methocarbamol (ROBAXIN) 500 MG tablet Take 1 tablet (500 mg total) by mouth every 8 (eight) hours as needed for muscle spasms. 30 tablet 1  . montelukast (SINGULAIR) 10 MG tablet Take 10 mg by mouth at bedtime.    . Multiple Vitamin (MULTIVITAMIN WITH MINERALS) TABS Take 1 tablet by mouth daily.     Marland Kitchen omeprazole (PRILOSEC) 20 MG capsule Take 20 mg by mouth daily.    Marland Kitchen oxyCODONE (OXY IR/ROXICODONE) 5 MG immediate release tablet Take 0.5-1 tablets (2.5-5 mg total) by mouth every 4 (four)  hours as needed for severe pain. 20 tablet 0  . pregabalin (LYRICA) 75 MG capsule Take 75 mg by mouth 2 (two) times daily.     No current facility-administered medications on file prior to visit.    BP 118/70   Pulse  68   Temp (!) 97.3 F (36.3 C) (Other (Comment))   Ht 5\' 3"  (1.6 m)   Wt 115 lb (52.2 kg)   SpO2 97%   BMI 20.37 kg/m       Objective:   Physical Exam Vitals and nursing note reviewed.  Constitutional:      Appearance: Normal appearance.  HENT:     Mouth/Throat:     Mouth: Mucous membranes are moist.     Palate: No lesions.     Pharynx: Oropharynx is clear. Posterior oropharyngeal erythema present.     Comments: She has a small canker sore along the frenulum.  Also with a small white patch with localized erythema on the oral mucosa on the right bottom cheek.  Very mild erythema noted to fair necks but no white patches. Skin:    General: Skin is warm and dry.     Capillary Refill: Capillary refill takes less than 2 seconds.  Neurological:     Mental Status: She is alert.  Psychiatric:        Mood and Affect: Mood normal.        Behavior: Behavior normal.        Thought Content: Thought content normal.        Judgment: Judgment normal.       Assessment & Plan:  1. Thrush -We will prescribe Magic mouthwash with nystatin.  Advised gargle swish and spit.  Follow-up if no improvement - magic mouthwash w/lidocaine SOLN; Take 5 mLs by mouth 3 (three) times daily as needed.  Dispense: 180 mL; Refill: 1  [name], NP

## 2019-07-19 DIAGNOSIS — S02621D Fracture of subcondylar process of right mandible, subsequent encounter for fracture with routine healing: Secondary | ICD-10-CM | POA: Diagnosis not present

## 2019-07-20 DIAGNOSIS — H1045 Other chronic allergic conjunctivitis: Secondary | ICD-10-CM | POA: Diagnosis not present

## 2019-07-20 DIAGNOSIS — J301 Allergic rhinitis due to pollen: Secondary | ICD-10-CM | POA: Diagnosis not present

## 2019-07-20 DIAGNOSIS — J3089 Other allergic rhinitis: Secondary | ICD-10-CM | POA: Diagnosis not present

## 2019-07-20 DIAGNOSIS — J3081 Allergic rhinitis due to animal (cat) (dog) hair and dander: Secondary | ICD-10-CM | POA: Diagnosis not present

## 2019-07-29 ENCOUNTER — Ambulatory Visit: Payer: Medicare Other | Attending: Internal Medicine

## 2019-07-29 DIAGNOSIS — Z23 Encounter for immunization: Secondary | ICD-10-CM | POA: Insufficient documentation

## 2019-07-29 NOTE — Progress Notes (Signed)
   Covid-19 Vaccination Clinic  Name:  [name]    MRN: [id number] DOB: 1945/12/20  07/29/2019  [name] was observed post Covid-19 immunization for 15 minutes without incidence. She was provided with Vaccine Information Sheet and instruction to access the V-Safe system.   [name] was instructed to call 911 with any severe reactions post vaccine: Marland Kitchen Difficulty breathing  . Swelling of your face and throat  . A fast heartbeat  . A bad rash all over your body  . Dizziness and weakness    Immunizations Administered    Name Date Dose VIS Date Route   Pfizer COVID-19 Vaccine 07/29/2019  6:23 PM 0.3 mL 06/17/2019 Intramuscular   Manufacturer: [place]   Lot: [id number]   [place]: [id number]

## 2019-08-19 ENCOUNTER — Ambulatory Visit: Payer: Medicare Other | Attending: Internal Medicine

## 2019-08-19 DIAGNOSIS — Z23 Encounter for immunization: Secondary | ICD-10-CM

## 2019-08-19 NOTE — Progress Notes (Signed)
   Covid-19 Vaccination Clinic  Name:  Jennifer Deleon    MRN: [id number] DOB: 10-19-1945  08/19/2019  [name] was observed post Covid-19 immunization for 15 minutes without incidence. She was provided with Vaccine Information Sheet and instruction to access the V-Safe system.   [name] was instructed to call 911 with any severe reactions post vaccine: Marland Kitchen Difficulty breathing  . Swelling of your face and throat  . A fast heartbeat  . A bad rash all over your body  . Dizziness and weakness    Immunizations Administered    Name Date Dose VIS Date Route   Pfizer COVID-19 Vaccine 08/19/2019 10:47 AM 0.3 mL 06/17/2019 Intramuscular   Manufacturer: [place]   Lot: [id number]   [place]: [id number]

## 2019-08-23 DIAGNOSIS — S02621D Fracture of subcondylar process of right mandible, subsequent encounter for fracture with routine healing: Secondary | ICD-10-CM | POA: Diagnosis not present

## 2019-08-25 ENCOUNTER — Encounter: Payer: Medicare Other | Admitting: Adult Health

## 2019-08-30 DIAGNOSIS — H52203 Unspecified astigmatism, bilateral: Secondary | ICD-10-CM | POA: Diagnosis not present

## 2019-08-30 DIAGNOSIS — Z961 Presence of intraocular lens: Secondary | ICD-10-CM | POA: Diagnosis not present

## 2019-08-30 DIAGNOSIS — H43813 Vitreous degeneration, bilateral: Secondary | ICD-10-CM | POA: Diagnosis not present

## 2019-09-15 DIAGNOSIS — J301 Allergic rhinitis due to pollen: Secondary | ICD-10-CM | POA: Diagnosis not present

## 2019-09-15 DIAGNOSIS — J3089 Other allergic rhinitis: Secondary | ICD-10-CM | POA: Diagnosis not present

## 2019-09-15 DIAGNOSIS — J3081 Allergic rhinitis due to animal (cat) (dog) hair and dander: Secondary | ICD-10-CM | POA: Diagnosis not present

## 2019-09-20 DIAGNOSIS — J3081 Allergic rhinitis due to animal (cat) (dog) hair and dander: Secondary | ICD-10-CM | POA: Diagnosis not present

## 2019-09-20 DIAGNOSIS — J3089 Other allergic rhinitis: Secondary | ICD-10-CM | POA: Diagnosis not present

## 2019-09-20 DIAGNOSIS — J301 Allergic rhinitis due to pollen: Secondary | ICD-10-CM | POA: Diagnosis not present

## 2019-09-22 DIAGNOSIS — J3089 Other allergic rhinitis: Secondary | ICD-10-CM | POA: Diagnosis not present

## 2019-09-22 DIAGNOSIS — J3081 Allergic rhinitis due to animal (cat) (dog) hair and dander: Secondary | ICD-10-CM | POA: Diagnosis not present

## 2019-09-22 DIAGNOSIS — J301 Allergic rhinitis due to pollen: Secondary | ICD-10-CM | POA: Diagnosis not present

## 2019-09-28 DIAGNOSIS — J301 Allergic rhinitis due to pollen: Secondary | ICD-10-CM | POA: Diagnosis not present

## 2019-09-28 DIAGNOSIS — J3089 Other allergic rhinitis: Secondary | ICD-10-CM | POA: Diagnosis not present

## 2019-09-28 DIAGNOSIS — J3081 Allergic rhinitis due to animal (cat) (dog) hair and dander: Secondary | ICD-10-CM | POA: Diagnosis not present

## 2019-09-30 DIAGNOSIS — J3089 Other allergic rhinitis: Secondary | ICD-10-CM | POA: Diagnosis not present

## 2019-09-30 DIAGNOSIS — J301 Allergic rhinitis due to pollen: Secondary | ICD-10-CM | POA: Diagnosis not present

## 2019-09-30 DIAGNOSIS — J3081 Allergic rhinitis due to animal (cat) (dog) hair and dander: Secondary | ICD-10-CM | POA: Diagnosis not present

## 2019-10-05 DIAGNOSIS — M5116 Intervertebral disc disorders with radiculopathy, lumbar region: Secondary | ICD-10-CM | POA: Diagnosis not present

## 2019-10-05 DIAGNOSIS — M4316 Spondylolisthesis, lumbar region: Secondary | ICD-10-CM | POA: Diagnosis not present

## 2019-10-05 DIAGNOSIS — Z681 Body mass index (BMI) 19 or less, adult: Secondary | ICD-10-CM | POA: Diagnosis not present

## 2019-10-05 DIAGNOSIS — M5416 Radiculopathy, lumbar region: Secondary | ICD-10-CM | POA: Diagnosis not present

## 2019-10-10 ENCOUNTER — Other Ambulatory Visit: Payer: Self-pay | Admitting: Orthopaedic Surgery

## 2019-10-10 DIAGNOSIS — M5416 Radiculopathy, lumbar region: Secondary | ICD-10-CM

## 2019-10-14 DIAGNOSIS — J3089 Other allergic rhinitis: Secondary | ICD-10-CM | POA: Diagnosis not present

## 2019-10-14 DIAGNOSIS — J3081 Allergic rhinitis due to animal (cat) (dog) hair and dander: Secondary | ICD-10-CM | POA: Diagnosis not present

## 2019-10-14 DIAGNOSIS — J301 Allergic rhinitis due to pollen: Secondary | ICD-10-CM | POA: Diagnosis not present

## 2019-10-27 DIAGNOSIS — J3089 Other allergic rhinitis: Secondary | ICD-10-CM | POA: Diagnosis not present

## 2019-10-27 DIAGNOSIS — J3081 Allergic rhinitis due to animal (cat) (dog) hair and dander: Secondary | ICD-10-CM | POA: Diagnosis not present

## 2019-10-27 DIAGNOSIS — J301 Allergic rhinitis due to pollen: Secondary | ICD-10-CM | POA: Diagnosis not present

## 2019-11-05 ENCOUNTER — Ambulatory Visit
Admission: RE | Admit: 2019-11-05 | Discharge: 2019-11-05 | Disposition: A | Discharge status: Home / Self Care | Payer: Medicare Other | Source: Ambulatory Visit | Attending: Orthopaedic Surgery | Admitting: Orthopaedic Surgery

## 2019-11-05 ENCOUNTER — Other Ambulatory Visit: Payer: Self-pay

## 2019-11-05 DIAGNOSIS — M5416 Radiculopathy, lumbar region: Secondary | ICD-10-CM

## 2019-11-05 DIAGNOSIS — M48061 Spinal stenosis, lumbar region without neurogenic claudication: Secondary | ICD-10-CM | POA: Diagnosis not present

## 2019-11-11 DIAGNOSIS — J3081 Allergic rhinitis due to animal (cat) (dog) hair and dander: Secondary | ICD-10-CM | POA: Diagnosis not present

## 2019-11-11 DIAGNOSIS — J301 Allergic rhinitis due to pollen: Secondary | ICD-10-CM | POA: Diagnosis not present

## 2019-11-11 DIAGNOSIS — J3089 Other allergic rhinitis: Secondary | ICD-10-CM | POA: Diagnosis not present

## 2019-11-18 DIAGNOSIS — M48062 Spinal stenosis, lumbar region with neurogenic claudication: Secondary | ICD-10-CM | POA: Diagnosis not present

## 2019-11-18 DIAGNOSIS — M5432 Sciatica, left side: Secondary | ICD-10-CM | POA: Diagnosis not present

## 2019-11-18 DIAGNOSIS — M961 Postlaminectomy syndrome, not elsewhere classified: Secondary | ICD-10-CM | POA: Diagnosis not present

## 2019-11-18 DIAGNOSIS — M4316 Spondylolisthesis, lumbar region: Secondary | ICD-10-CM | POA: Diagnosis not present

## 2019-11-25 DIAGNOSIS — J3081 Allergic rhinitis due to animal (cat) (dog) hair and dander: Secondary | ICD-10-CM | POA: Diagnosis not present

## 2019-11-25 DIAGNOSIS — J3089 Other allergic rhinitis: Secondary | ICD-10-CM | POA: Diagnosis not present

## 2019-11-25 DIAGNOSIS — J301 Allergic rhinitis due to pollen: Secondary | ICD-10-CM | POA: Diagnosis not present

## 2019-12-01 ENCOUNTER — Encounter: Payer: Medicare Other | Admitting: Adult Health

## 2019-12-02 DIAGNOSIS — J3081 Allergic rhinitis due to animal (cat) (dog) hair and dander: Secondary | ICD-10-CM | POA: Diagnosis not present

## 2019-12-02 DIAGNOSIS — J3089 Other allergic rhinitis: Secondary | ICD-10-CM | POA: Diagnosis not present

## 2019-12-02 DIAGNOSIS — J301 Allergic rhinitis due to pollen: Secondary | ICD-10-CM | POA: Diagnosis not present

## 2019-12-06 ENCOUNTER — Other Ambulatory Visit: Payer: Self-pay

## 2019-12-06 ENCOUNTER — Encounter: Payer: Self-pay | Admitting: Adult Health

## 2019-12-06 ENCOUNTER — Ambulatory Visit (INDEPENDENT_AMBULATORY_CARE_PROVIDER_SITE_OTHER): Payer: Medicare Other | Admitting: Adult Health

## 2019-12-06 VITALS — BP 110/70 | Temp 97.4°F | Ht 63.0 in | Wt 111.0 lb

## 2019-12-06 DIAGNOSIS — R251 Tremor, unspecified: Secondary | ICD-10-CM

## 2019-12-06 DIAGNOSIS — J302 Other seasonal allergic rhinitis: Secondary | ICD-10-CM

## 2019-12-06 DIAGNOSIS — M1711 Unilateral primary osteoarthritis, right knee: Secondary | ICD-10-CM | POA: Diagnosis not present

## 2019-12-06 DIAGNOSIS — E032 Hypothyroidism due to medicaments and other exogenous substances: Secondary | ICD-10-CM | POA: Diagnosis not present

## 2019-12-06 DIAGNOSIS — N3941 Urge incontinence: Secondary | ICD-10-CM | POA: Diagnosis not present

## 2019-12-06 LAB — CBC WITH DIFFERENTIAL/PLATELET
Basophils Absolute: 0.1 10*3/uL (ref 0.0–0.1)
Basophils Relative: 1.4 % (ref 0.0–3.0)
Eosinophils Absolute: 0.3 10*3/uL (ref 0.0–0.7)
Eosinophils Relative: 6.3 % — ABNORMAL HIGH (ref 0.0–5.0)
HCT: 39.4 % (ref 36.0–46.0)
Hemoglobin: 13.2 g/dL (ref 12.0–15.0)
Lymphocytes Relative: 32.5 % (ref 12.0–46.0)
Lymphs Abs: 1.4 10*3/uL (ref 0.7–4.0)
MCHC: 33.5 g/dL (ref 30.0–36.0)
MCV: 95.1 fl (ref 78.0–100.0)
Monocytes Absolute: 0.5 10*3/uL (ref 0.1–1.0)
Monocytes Relative: 11.6 % (ref 3.0–12.0)
Neutro Abs: 2.1 10*3/uL (ref 1.4–7.7)
Neutrophils Relative %: 48.2 % (ref 43.0–77.0)
Platelets: 266 10*3/uL (ref 150.0–400.0)
RBC: 4.14 Mil/uL (ref 3.87–5.11)
RDW: 13.6 % (ref 11.5–15.5)
WBC: 4.4 10*3/uL (ref 4.0–10.5)

## 2019-12-06 LAB — COMPREHENSIVE METABOLIC PANEL
ALT: 15 U/L (ref 0–35)
AST: 20 U/L (ref 0–37)
Albumin: 4.5 g/dL (ref 3.5–5.2)
Alkaline Phosphatase: 69 U/L (ref 39–117)
BUN: 15 mg/dL (ref 6–23)
CO2: 29 mEq/L (ref 19–32)
Calcium: 9.6 mg/dL (ref 8.4–10.5)
Chloride: 105 mEq/L (ref 96–112)
Creatinine, Ser: 0.69 mg/dL (ref 0.40–1.20)
GFR: 82.83 mL/min (ref >60.00)
Glucose, Bld: 93 mg/dL (ref 70–99)
Potassium: 4.2 mEq/L (ref 3.5–5.1)
Sodium: 142 mEq/L (ref 135–145)
Total Bilirubin: 0.6 mg/dL (ref 0.2–1.2)
Total Protein: 6.6 g/dL (ref 6.0–8.3)

## 2019-12-06 LAB — TSH: TSH: 1.19 u[IU]/mL (ref 0.35–4.50)

## 2019-12-06 LAB — VITAMIN B12: Vitamin B-12: 427 pg/mL (ref 211–911)

## 2019-12-06 NOTE — Progress Notes (Signed)
Subjective:    Patient ID: Jennifer Deleon, female    DOB: 02-Sep-1945, 74 y.o.   MRN: [id number]  HPI Patient presents for yearly preventative medicine examination. She is a pleasant 74 year old female who  has a past medical history of Arthritis, Asthma, Chicken pox, Colon polyps, Depression, Environmental allergies, Hypothyroid, Osteoporosis, and UTI (lower urinary tract infection).  Hypothyroidism - takes synthroid 125 mcg daily.  Feels well controlled.  Lab Results  Component Value Date   TSH 0.53 06/15/2019   Seasonal Allergies - is managed by Allergist and feels as though her symptoms are well controlled.   Low Back pain with left sided sciatica - was seen by orthopedics and had MRI of lumbar spine done on 11/05/2019 which showed:  1. Progressive disc and facet degeneration at L2-3. Moderate spinal stenosis and moderate subarticular stenosis bilaterally have progressed since the prior study 2. Progressive facet degeneration L3-4 with progression of moderate spinal stenosis and moderate subarticular stenosis bilaterally 3. Pedicle screw and interbody fusion L4-5 without stenosis.  She has an upcoming appointment for evaluation of steroid injection.   Urinary Incontinence - has noticed recently that she has been having some " leaking". By the time she gets the urge to urinate " it is too late". Denies UTI ike symptoms. She has been working on bladder training but has not noticed too much improvement yet.  Tremor - upper extremities - occasional and not constant over the last " few months" Notices more in the morning when reading the paper or writing.   All immunizations and health maintenance protocols were reviewed with the patient and needed orders were placed. She is up to date on vaccinations   Appropriate screening laboratory values were ordered for the patient including screening of hyperlipidemia, renal function and hepatic function.  Medication reconciliation,  past medical  history, social history, problem list and allergies were reviewed in detail with the patient  Goals were established with regard to weight loss, exercise, and  diet in compliance with medications.   Review of Systems  Constitutional: Negative.   HENT: Negative.   Eyes: Negative.   Respiratory: Negative.   Cardiovascular: Negative.   Gastrointestinal: Negative.   Endocrine: Negative.   Genitourinary: Positive for urgency. Negative for frequency.  Musculoskeletal: Positive for arthralgias, back pain and gait problem.  Skin: Negative.   Allergic/Immunologic: Negative.   Neurological: Positive for tremors.  Hematological: Negative.   Psychiatric/Behavioral: Negative.    Past Medical History:  Diagnosis Date  . Arthritis   . Asthma   . Chicken pox   . Colon polyps   . Depression   . Environmental allergies    allergies all year long  . Hypothyroid   . Osteoporosis   . UTI (lower urinary tract infection)     Social History   Socioeconomic History  . Marital status: Married    Spouse name: Not on file  . Number of children: Not on file  . Years of education: Not on file  . Highest education level: Not on file  Occupational History  . Not on file  Tobacco Use  . Smoking status: Never Smoker  . Smokeless tobacco: Never Used  Substance and Sexual Activity  . Alcohol use: Yes    Alcohol/week: 7.0 standard drinks    Types: 7 Glasses of wine per week    Comment: a glass of wine with dinner each night  . Drug use: No  . Sexual activity: Not on file  Other  Topics Concern  . Not on file  Social History Narrative   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ighter    Married for  11 years    [place] who live in [place] Strain:   . Difficulty of Paying Living Expenses:   Food Insecurity:   . Worried About Charity fundraiser in the Last Year:   . Arboriculturist in the Last Year:   Transportation Needs:   . Lexicographer (Medical):   Marland Kitchen Lack of Transportation (Non-Medical):   Physical Activity:   . Days of Exercise per Week:   . Minutes of Exercise per Session:   Stress:   . Feeling of Stress :   Social Connections:   . Frequency of Communication with Friends and Family:   . Frequency of Social Gatherings with Friends and Family:   . Attends Religious Services:   . Active Member of Clubs or Organizations:   . Attends Archivist Meetings:   Marland Kitchen Marital Status:   Intimate Partner Violence:   . Fear of Current or Ex-Partner:   . Emotionally Abused:   Marland Kitchen Physically Abused:   . Sexually Abused:     Past Surgical History:  Procedure Laterality Date  . CATARACT EXTRACTION Bilateral 2016  . LAMINECTOMY    . LUMBAR FUSION  2015   l4-L5  . TUBAL LIGATION      Family History  Problem Relation Age of Onset  . Osteoarthritis Mother   . Osteoarthritis Maternal Grandmother   . Osteoarthritis Father   . Hypertension Father   . Heart failure Father   . Prostate cancer Paternal Grandfather        mets to colon  . Arthritis Other   . Prostate cancer Other   . Mental illness Other     Allergies  Allergen Reactions  . Augmentin [Amoxicillin-Pot Clavulanate] Diarrhea and Other (See Comments)    Severe Stomach cramps  . Bactrim [Sulfamethoxazole-Trimethoprim] Swelling    Facial and lip swelling    Current Outpatient Medications on File Prior to Visit  Medication Sig Dispense Refill  . azelastine (ASTELIN) 0.1 % nasal spray Place 1 spray into both nostrils 2 (two) times daily. Use in each nostril as directed    . beclomethasone (QVAR) 40 MCG/ACT inhaler Inhale 2 puffs into the lungs 2 (two) times daily.    Marland Kitchen buPROPion (WELLBUTRIN XL) 300 MG 24 hr tablet Take 300 mg by mouth daily.     . calcium-vitamin D (OSCAL-500) 500-400 MG-UNIT per tablet Take 1 tablet by mouth 2 (two) times daily with a meal.    . cetirizine (ZYRTEC) 10 MG tablet Take 10 mg by mouth daily.     . diclofenac  sodium (VOLTAREN) 1 % GEL Apply topically to affected area qid (Patient taking differently: Apply 2 g topically 4 (four) times daily. ) 100 g 1  . diphenhydrAMINE (BENADRYL) 25 MG tablet Take 25 mg by mouth every 6 (six) hours as needed for allergies.    Marland Kitchen docusate sodium (COLACE) 100 MG capsule Take 100 mg by mouth 2 (two) times daily as needed for mild constipation.     . DULoxetine (CYMBALTA) 60 MG capsule Take 60 mg by mouth daily.     . fexofenadine (ALLER-EASE) 180 MG tablet Take 180 mg by mouth daily.     . fluticasone (FLONASE) 50 MCG/ACT nasal spray Place 1 spray into both nostrils 2 (two)  times daily.    Marland Kitchen levothyroxine (SYNTHROID) 112 MCG tablet Take 1 tablet (112 mcg total) by mouth daily. 90 tablet 3  . magic mouthwash SOLN Take 15 mLs by mouth 4 (four) times daily as needed for mouth pain. 240 mL 1  . magic mouthwash w/lidocaine SOLN Take 5 mLs by mouth 3 (three) times daily as needed. 180 mL 1  . methocarbamol (ROBAXIN) 500 MG tablet Take 1 tablet (500 mg total) by mouth every 8 (eight) hours as needed for muscle spasms. 30 tablet 1  . montelukast (SINGULAIR) 10 MG tablet Take 10 mg by mouth at bedtime.    . Multiple Vitamin (MULTIVITAMIN WITH MINERALS) TABS Take 1 tablet by mouth daily.     Marland Kitchen omeprazole (PRILOSEC) 20 MG capsule Take 20 mg by mouth daily.    Marland Kitchen oxyCODONE (OXY IR/ROXICODONE) 5 MG immediate release tablet Take 0.5-1 tablets (2.5-5 mg total) by mouth every 4 (four) hours as needed for severe pain. 20 tablet 0  . pregabalin (LYRICA) 75 MG capsule Take 75 mg by mouth 2 (two) times daily.     No current facility-administered medications on file prior to visit.    There were no vitals taken for this visit.      Objective:   Physical Exam Vitals and nursing note reviewed.  Constitutional:      General: She is not in acute distress.    Appearance: Normal appearance. She is well-developed. She is not ill-appearing.  HENT:     Head: Normocephalic and atraumatic.      Right Ear: Tympanic membrane, ear canal and external ear normal. There is no impacted cerumen.     Left Ear: Tympanic membrane, ear canal and external ear normal. There is no impacted cerumen.     Nose: Nose normal. No congestion or rhinorrhea.     Mouth/Throat:     Mouth: Mucous membranes are moist.     Pharynx: Oropharynx is clear. No oropharyngeal exudate or posterior oropharyngeal erythema.  Eyes:     General:        Right eye: No discharge.        Left eye: No discharge.     Extraocular Movements: Extraocular movements intact.     Conjunctiva/sclera: Conjunctivae normal.     Pupils: Pupils are equal, round, and reactive to light.  Neck:     Thyroid: No thyromegaly.     Vascular: No carotid bruit.     Trachea: No tracheal deviation.  Cardiovascular:     Rate and Rhythm: Normal rate and regular rhythm.     Pulses: Normal pulses.     Heart sounds: Normal heart sounds. No murmur. No friction rub. No gallop.   Pulmonary:     Effort: Pulmonary effort is normal. No respiratory distress.     Breath sounds: Normal breath sounds. No stridor. No wheezing, rhonchi or rales.  Chest:     Chest wall: No tenderness.  Abdominal:     General: Abdomen is flat. Bowel sounds are normal. There is no distension.     Palpations: Abdomen is soft. There is no mass.     Tenderness: There is no abdominal tenderness. There is no right CVA tenderness, left CVA tenderness, guarding or rebound.     Hernia: No hernia is present.  Musculoskeletal:        General: No swelling, tenderness, deformity or signs of injury. Normal range of motion.     Cervical back: Normal range of motion and neck supple.  Right lower leg: No edema.     Left lower leg: No edema.  Lymphadenopathy:     Cervical: No cervical adenopathy.  Skin:    General: Skin is warm and dry.     Coloration: Skin is not jaundiced or pale.     Findings: No bruising, erythema, lesion or rash.  Neurological:     General: No focal deficit  present.     Mental Status: She is alert and oriented to person, place, and time.     Cranial Nerves: No cranial nerve deficit.     Sensory: No sensory deficit.     Motor: No weakness, tremor, atrophy or abnormal muscle tone.     Coordination: Coordination is intact. Coordination normal.     Gait: Gait normal.     Deep Tendon Reflexes: Reflexes are normal and symmetric. Reflexes normal.     Comments: No tremor today.  No pill-rolling noticed.  Psychiatric:        Mood and Affect: Mood normal.        Behavior: Behavior normal.        Thought Content: Thought content normal.        Judgment: Judgment normal.       Assessment & Plan:  1. Hypothyroidism due to medication -Consider dose change in Synthroid - CBC with Differential/Platelet - Comprehensive metabolic panel - TSH - Vitamin B12  2. Tremor of both hands -Check TSH and vitamin B12.  Likely essential tremor.  We will continue to monitor. - CBC with Differential/Platelet - Comprehensive metabolic panel - TSH - Vitamin B12  3. Primary osteoarthritis of right knee -Follow-up with orthopedics as directed  4. Seasonal allergies -Follow-up with allergy as directed  5. Urge incontinence of urine -Discussed various treatments including pelvic floor exercises, medications, and bladder training.  She will continue with bladder training and would like to do pelvic floor exercises before returning to medications - CBC with Differential/Platelet - Comprehensive metabolic panel - TSH - Vitamin B12   [name], NP

## 2019-12-06 NOTE — Patient Instructions (Signed)
It was great seeing you today   We will follow up with you regarding your blood work

## 2019-12-08 DIAGNOSIS — M961 Postlaminectomy syndrome, not elsewhere classified: Secondary | ICD-10-CM | POA: Diagnosis not present

## 2019-12-08 DIAGNOSIS — M48062 Spinal stenosis, lumbar region with neurogenic claudication: Secondary | ICD-10-CM | POA: Diagnosis not present

## 2019-12-08 DIAGNOSIS — M4316 Spondylolisthesis, lumbar region: Secondary | ICD-10-CM | POA: Diagnosis not present

## 2019-12-12 DIAGNOSIS — J3081 Allergic rhinitis due to animal (cat) (dog) hair and dander: Secondary | ICD-10-CM | POA: Diagnosis not present

## 2019-12-12 DIAGNOSIS — J301 Allergic rhinitis due to pollen: Secondary | ICD-10-CM | POA: Diagnosis not present

## 2019-12-12 DIAGNOSIS — J3089 Other allergic rhinitis: Secondary | ICD-10-CM | POA: Diagnosis not present

## 2019-12-20 DIAGNOSIS — M48062 Spinal stenosis, lumbar region with neurogenic claudication: Secondary | ICD-10-CM | POA: Diagnosis not present

## 2020-01-27 DIAGNOSIS — J301 Allergic rhinitis due to pollen: Secondary | ICD-10-CM | POA: Diagnosis not present

## 2020-01-27 DIAGNOSIS — J3089 Other allergic rhinitis: Secondary | ICD-10-CM | POA: Diagnosis not present

## 2020-02-10 DIAGNOSIS — J3081 Allergic rhinitis due to animal (cat) (dog) hair and dander: Secondary | ICD-10-CM | POA: Diagnosis not present

## 2020-02-10 DIAGNOSIS — J301 Allergic rhinitis due to pollen: Secondary | ICD-10-CM | POA: Diagnosis not present

## 2020-02-10 DIAGNOSIS — J3089 Other allergic rhinitis: Secondary | ICD-10-CM | POA: Diagnosis not present

## 2020-03-02 DIAGNOSIS — J3089 Other allergic rhinitis: Secondary | ICD-10-CM | POA: Diagnosis not present

## 2020-03-02 DIAGNOSIS — J3081 Allergic rhinitis due to animal (cat) (dog) hair and dander: Secondary | ICD-10-CM | POA: Diagnosis not present

## 2020-03-02 DIAGNOSIS — J301 Allergic rhinitis due to pollen: Secondary | ICD-10-CM | POA: Diagnosis not present

## 2020-03-05 DIAGNOSIS — J3089 Other allergic rhinitis: Secondary | ICD-10-CM | POA: Diagnosis not present

## 2020-03-05 DIAGNOSIS — J3081 Allergic rhinitis due to animal (cat) (dog) hair and dander: Secondary | ICD-10-CM | POA: Diagnosis not present

## 2020-03-05 DIAGNOSIS — J301 Allergic rhinitis due to pollen: Secondary | ICD-10-CM | POA: Diagnosis not present

## 2020-03-09 DIAGNOSIS — J3081 Allergic rhinitis due to animal (cat) (dog) hair and dander: Secondary | ICD-10-CM | POA: Diagnosis not present

## 2020-03-09 DIAGNOSIS — J301 Allergic rhinitis due to pollen: Secondary | ICD-10-CM | POA: Diagnosis not present

## 2020-03-09 DIAGNOSIS — J3089 Other allergic rhinitis: Secondary | ICD-10-CM | POA: Diagnosis not present

## 2020-03-13 DIAGNOSIS — J301 Allergic rhinitis due to pollen: Secondary | ICD-10-CM | POA: Diagnosis not present

## 2020-03-13 DIAGNOSIS — J3089 Other allergic rhinitis: Secondary | ICD-10-CM | POA: Diagnosis not present

## 2020-03-13 DIAGNOSIS — J3081 Allergic rhinitis due to animal (cat) (dog) hair and dander: Secondary | ICD-10-CM | POA: Diagnosis not present

## 2020-03-16 DIAGNOSIS — J301 Allergic rhinitis due to pollen: Secondary | ICD-10-CM | POA: Diagnosis not present

## 2020-03-16 DIAGNOSIS — J3089 Other allergic rhinitis: Secondary | ICD-10-CM | POA: Diagnosis not present

## 2020-03-16 DIAGNOSIS — J3081 Allergic rhinitis due to animal (cat) (dog) hair and dander: Secondary | ICD-10-CM | POA: Diagnosis not present

## 2020-03-27 ENCOUNTER — Telehealth: Payer: Self-pay | Admitting: Adult Health

## 2020-03-27 DIAGNOSIS — J3081 Allergic rhinitis due to animal (cat) (dog) hair and dander: Secondary | ICD-10-CM | POA: Diagnosis not present

## 2020-03-27 DIAGNOSIS — J301 Allergic rhinitis due to pollen: Secondary | ICD-10-CM | POA: Diagnosis not present

## 2020-03-27 DIAGNOSIS — J3089 Other allergic rhinitis: Secondary | ICD-10-CM | POA: Diagnosis not present

## 2020-03-27 NOTE — Telephone Encounter (Signed)
Patient pick up the phone unable to understand her.

## 2020-04-12 DIAGNOSIS — J301 Allergic rhinitis due to pollen: Secondary | ICD-10-CM | POA: Diagnosis not present

## 2020-04-12 DIAGNOSIS — J3081 Allergic rhinitis due to animal (cat) (dog) hair and dander: Secondary | ICD-10-CM | POA: Diagnosis not present

## 2020-04-12 DIAGNOSIS — J3089 Other allergic rhinitis: Secondary | ICD-10-CM | POA: Diagnosis not present

## 2020-04-20 DIAGNOSIS — Z23 Encounter for immunization: Secondary | ICD-10-CM | POA: Diagnosis not present

## 2020-04-23 ENCOUNTER — Encounter: Payer: Self-pay | Admitting: Adult Health

## 2020-04-23 MED ORDER — LEVOTHYROXINE SODIUM 112 MCG PO TABS: 112.0000 ug | ORAL_TABLET | Freq: Every day | ORAL | 3 refills | Status: DC

## 2020-04-27 DIAGNOSIS — J3089 Other allergic rhinitis: Secondary | ICD-10-CM | POA: Diagnosis not present

## 2020-04-27 DIAGNOSIS — J3081 Allergic rhinitis due to animal (cat) (dog) hair and dander: Secondary | ICD-10-CM | POA: Diagnosis not present

## 2020-04-27 DIAGNOSIS — J301 Allergic rhinitis due to pollen: Secondary | ICD-10-CM | POA: Diagnosis not present

## 2020-05-15 DIAGNOSIS — M4316 Spondylolisthesis, lumbar region: Secondary | ICD-10-CM | POA: Diagnosis not present

## 2020-05-15 DIAGNOSIS — M48062 Spinal stenosis, lumbar region with neurogenic claudication: Secondary | ICD-10-CM | POA: Diagnosis not present

## 2020-05-16 DIAGNOSIS — J301 Allergic rhinitis due to pollen: Secondary | ICD-10-CM | POA: Diagnosis not present

## 2020-05-16 DIAGNOSIS — J3081 Allergic rhinitis due to animal (cat) (dog) hair and dander: Secondary | ICD-10-CM | POA: Diagnosis not present

## 2020-05-16 DIAGNOSIS — J3089 Other allergic rhinitis: Secondary | ICD-10-CM | POA: Diagnosis not present

## 2020-05-28 DIAGNOSIS — M48062 Spinal stenosis, lumbar region with neurogenic claudication: Secondary | ICD-10-CM | POA: Diagnosis not present

## 2020-05-29 DIAGNOSIS — J301 Allergic rhinitis due to pollen: Secondary | ICD-10-CM | POA: Diagnosis not present

## 2020-05-29 DIAGNOSIS — J3081 Allergic rhinitis due to animal (cat) (dog) hair and dander: Secondary | ICD-10-CM | POA: Diagnosis not present

## 2020-05-29 DIAGNOSIS — J3089 Other allergic rhinitis: Secondary | ICD-10-CM | POA: Diagnosis not present

## 2020-06-20 DIAGNOSIS — M4316 Spondylolisthesis, lumbar region: Secondary | ICD-10-CM | POA: Diagnosis not present

## 2020-06-20 DIAGNOSIS — M48062 Spinal stenosis, lumbar region with neurogenic claudication: Secondary | ICD-10-CM | POA: Diagnosis not present

## 2020-06-25 DIAGNOSIS — J301 Allergic rhinitis due to pollen: Secondary | ICD-10-CM | POA: Diagnosis not present

## 2020-06-25 DIAGNOSIS — J3081 Allergic rhinitis due to animal (cat) (dog) hair and dander: Secondary | ICD-10-CM | POA: Diagnosis not present

## 2020-06-25 DIAGNOSIS — J3089 Other allergic rhinitis: Secondary | ICD-10-CM | POA: Diagnosis not present

## 2020-07-16 DIAGNOSIS — J3089 Other allergic rhinitis: Secondary | ICD-10-CM | POA: Diagnosis not present

## 2020-07-16 DIAGNOSIS — J3081 Allergic rhinitis due to animal (cat) (dog) hair and dander: Secondary | ICD-10-CM | POA: Diagnosis not present

## 2020-07-16 DIAGNOSIS — J301 Allergic rhinitis due to pollen: Secondary | ICD-10-CM | POA: Diagnosis not present

## 2020-07-19 DIAGNOSIS — J3081 Allergic rhinitis due to animal (cat) (dog) hair and dander: Secondary | ICD-10-CM | POA: Diagnosis not present

## 2020-07-19 DIAGNOSIS — J301 Allergic rhinitis due to pollen: Secondary | ICD-10-CM | POA: Diagnosis not present

## 2020-07-19 DIAGNOSIS — J3089 Other allergic rhinitis: Secondary | ICD-10-CM | POA: Diagnosis not present

## 2020-07-19 DIAGNOSIS — H1045 Other chronic allergic conjunctivitis: Secondary | ICD-10-CM | POA: Diagnosis not present

## 2020-07-31 ENCOUNTER — Telehealth: Payer: Self-pay | Admitting: Adult Health

## 2020-07-31 DIAGNOSIS — H903 Sensorineural hearing loss, bilateral: Secondary | ICD-10-CM | POA: Diagnosis not present

## 2020-07-31 DIAGNOSIS — H9193 Unspecified hearing loss, bilateral: Secondary | ICD-10-CM

## 2020-07-31 NOTE — Telephone Encounter (Signed)
Ok for referral?

## 2020-07-31 NOTE — Telephone Encounter (Signed)
Order placed and pt notified.  Nothing further needed. 

## 2020-07-31 NOTE — Telephone Encounter (Signed)
Patient states she needs a referral sent to [facility]. She states they are unable to bill medicare for her hearing test without a referral.  Referral can be faxed to [phone].  Please advise.

## 2020-08-14 DIAGNOSIS — J301 Allergic rhinitis due to pollen: Secondary | ICD-10-CM | POA: Diagnosis not present

## 2020-08-14 DIAGNOSIS — J3089 Other allergic rhinitis: Secondary | ICD-10-CM | POA: Diagnosis not present

## 2020-08-14 DIAGNOSIS — J3081 Allergic rhinitis due to animal (cat) (dog) hair and dander: Secondary | ICD-10-CM | POA: Diagnosis not present

## 2020-08-17 DIAGNOSIS — J3089 Other allergic rhinitis: Secondary | ICD-10-CM | POA: Diagnosis not present

## 2020-08-17 DIAGNOSIS — J3081 Allergic rhinitis due to animal (cat) (dog) hair and dander: Secondary | ICD-10-CM | POA: Diagnosis not present

## 2020-08-17 DIAGNOSIS — J301 Allergic rhinitis due to pollen: Secondary | ICD-10-CM | POA: Diagnosis not present

## 2020-08-30 DIAGNOSIS — J301 Allergic rhinitis due to pollen: Secondary | ICD-10-CM | POA: Diagnosis not present

## 2020-08-30 DIAGNOSIS — J3089 Other allergic rhinitis: Secondary | ICD-10-CM | POA: Diagnosis not present

## 2020-08-30 DIAGNOSIS — J3081 Allergic rhinitis due to animal (cat) (dog) hair and dander: Secondary | ICD-10-CM | POA: Diagnosis not present

## 2020-08-31 ENCOUNTER — Ambulatory Visit: Payer: Medicare Other

## 2020-09-04 DIAGNOSIS — Z961 Presence of intraocular lens: Secondary | ICD-10-CM | POA: Diagnosis not present

## 2020-09-04 DIAGNOSIS — H52203 Unspecified astigmatism, bilateral: Secondary | ICD-10-CM | POA: Diagnosis not present

## 2020-09-12 NOTE — Progress Notes (Deleted)
Subjective:   MARYUM BATTERSON is a 75 y.o. female who presents for Medicare Annual (Subsequent) preventive examination.  Review of Systems    N/A        Objective:    There were no vitals filed for this visit. There is no height or weight on file to calculate BMI.  Advanced Directives 07/01/2019 08/11/2015  Does Patient Have a Medical Advance Directive? Yes No  Type of Paramedic of [place];Living will -  Does patient want to make changes to medical advance directive? No - Patient declined -  Copy of [place] in Chart? No - copy requested -  Would patient like information on creating a medical advance directive? - No - patient declined information    Current Medications (verified) Outpatient Encounter Medications as of 09/13/2020  Medication Sig  . azelastine (ASTELIN) 0.1 % nasal spray Place 1 spray into both nostrils 2 (two) times daily. Use in each nostril as directed  . beclomethasone (QVAR) 40 MCG/ACT inhaler Inhale 2 puffs into the lungs 2 (two) times daily.  Marland Kitchen buPROPion (WELLBUTRIN XL) 300 MG 24 hr tablet Take 300 mg by mouth daily.   . calcium-vitamin D (OSCAL-500) 500-400 MG-UNIT per tablet Take 1 tablet by mouth 2 (two) times daily with a meal.  . cetirizine (ZYRTEC) 10 MG tablet Take 10 mg by mouth daily.   . diphenhydrAMINE (BENADRYL) 25 MG tablet Take 25 mg by mouth every 6 (six) hours as needed for allergies.  Marland Kitchen docusate sodium (COLACE) 100 MG capsule Take 100 mg by mouth 2 (two) times daily as needed for mild constipation.   . DULoxetine (CYMBALTA) 60 MG capsule Take 60 mg by mouth daily.   . fexofenadine (ALLER-EASE) 180 MG tablet Take 180 mg by mouth daily.   . fluticasone (FLONASE) 50 MCG/ACT nasal spray Place 1 spray into both nostrils 2 (two) times daily.  Marland Kitchen levothyroxine (SYNTHROID) 112 MCG tablet Take 1 tablet (112 mcg total) by mouth daily.  . montelukast (SINGULAIR) 10 MG tablet Take 10 mg by mouth at bedtime.  .  Multiple Vitamin (MULTIVITAMIN WITH MINERALS) TABS Take 1 tablet by mouth daily.   . pregabalin (LYRICA) 75 MG capsule Take 75 mg by mouth 2 (two) times daily.   No facility-administered encounter medications on file as of 09/13/2020.    Allergies (verified) Augmentin [amoxicillin-pot clavulanate] and Bactrim [sulfamethoxazole-trimethoprim]   History: Past Medical History:  Diagnosis Date  . Arthritis   . Asthma   . Chicken pox   . Colon polyps   . Depression   . Environmental allergies    allergies all year long  . Hypothyroid   . Osteoporosis   . UTI (lower urinary tract infection)    Past Surgical History:  Procedure Laterality Date  . CATARACT EXTRACTION Bilateral 2016  . LAMINECTOMY    . LUMBAR FUSION  2015   l4-L5  . TUBAL LIGATION     Family History  Problem Relation Age of Onset  . Osteoarthritis Mother   . Osteoarthritis Maternal Grandmother   . Osteoarthritis Father   . Hypertension Father   . Heart failure Father   . Prostate cancer Paternal Grandfather        mets to colon  . Arthritis Other   . Prostate cancer Other   . Mental illness Other    Social History   Socioeconomic History  . Marital status: Married    Spouse name: Not on file  . Number of  children: Not on file  . Years of education: Not on file  . Highest education level: Not on file  Occupational History  . Not on file  Tobacco Use  . Smoking status: Never Smoker  . Smokeless tobacco: Never Used  Vaping Use  . Vaping Use: Never used  Substance and Sexual Activity  . Alcohol use: Yes    Alcohol/week: 7.0 standard drinks    Types: 7 Glasses of wine per week    Comment: a glass of wine with dinner each night  . Drug use: No  . Sexual activity: Not on file  Other Topics Concern  . Not on file  Social History Narrative   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ighter    Married for  11 years    Children who live in [place] Strain: Not  on [employer] Insecurity: Not on file  Transportation Needs: Not on file  Physical Activity: Not on file  Stress: Not on file  Social Connections: Not on file    Tobacco Counseling Counseling given: Not Answered   Clinical Intake:                 Diabetic?No          Activities of Daily Living No flowsheet data found.  Patient Care Team: [name], NP as PCP - General (Family Medicine) Tiajuana Amass, MD as Referring Physician (Allergy and Immunology) Leta Baptist, MD as Consulting Physician (Otolaryngology) Zonia Kief, MD (Rehabilitation) [facility] (Neurology)  Indicate any recent Medical Services you may have received from other than [facility] providers in the past year (date may be approximate).     Assessment:   This is a routine wellness examination for Julieta.  Hearing/Vision screen No exam data present  Dietary issues and exercise activities discussed:    Goals   None    Depression Screen PHQ 2/9 Scores 01/20/2017 04/05/2015  PHQ - 2 Score 0 0    Fall Risk Fall Risk  05/11/2019 02/07/2019 02/02/2017 01/20/2017  Falls in the past year? 0 (No Data) Yes Yes  Comment - Emmi Telephone Survey: data to providers prior to load Emmi Telephone Survey: data to providers prior to load -  Number falls in past yr: 0 (No Data) 1 1  Comment - Emmi Telephone Survey Actual Response =  Emmi Telephone Survey Actual Response = 1 -  Injury with Fall? 0 - Yes Yes  Risk for fall due to : - - - Impaired balance/gait  Follow up Falls evaluation completed - - Education provided    FALL RISK PREVENTION PERTAINING TO THE HOME:  Any stairs in or around the home? {YES/NO:21197} If so, are there any without handrails? No  Home free of loose throw rugs in walkways, pet beds, electrical cords, etc? Yes  Adequate lighting in your home to reduce risk of falls? Yes   ASSISTIVE DEVICES UTILIZED TO PREVENT FALLS:  Life alert? {YES/NO:21197} Use of a cane,  walker or w/c? {YES/NO:21197} Grab bars in the bathroom? {YES/NO:21197} Shower chair or bench in shower? {YES/NO:21197} Elevated toilet seat or a handicapped toilet? {YES/NO:21197}    Cognitive Function:        Immunizations Immunization History  Administered Date(s) Administered  . Fluad Quad(high Dose 65+) 05/11/2019  . Influenza, High Dose Seasonal PF 04/21/2017  . Influenza-Unspecified 03/06/2015, 04/20/2020  . PFIZER(Purple Top)SARS-COV-2 Vaccination 07/29/2019, 08/19/2019  . Pneumococcal Conjugate-13 04/21/2017  . Pneumococcal  Polysaccharide-23 07/07/2012  . Tdap 07/07/2010    TDAP status: Due, Education has been provided regarding the importance of this vaccine. Advised may receive this vaccine at local pharmacy or Health Dept. Aware to provide a copy of the vaccination record if obtained from local pharmacy or Health Dept. Verbalized acceptance and understanding.  Flu Vaccine status: Up to date  Pneumococcal vaccine status: Up to date  Covid-19 vaccine status: Completed vaccines  Qualifies for Shingles Vaccine? Yes   Zostavax completed No   Shingrix Completed?: No.    Education has been provided regarding the importance of this vaccine. Patient has been advised to call insurance company to determine out of pocket expense if they have not yet received this vaccine. Advised may also receive vaccine at local pharmacy or Health Dept. Verbalized acceptance and understanding.  Screening Tests Health Maintenance  Topic Date Due  . Hepatitis C Screening  Never done  . COVID-19 Vaccine (3 - Booster for Pfizer series) 02/16/2020  . TETANUS/TDAP  07/07/2020  . INFLUENZA VACCINE  Completed  . DEXA SCAN  Completed  . PNA vac Low Risk Adult  Completed  . HPV VACCINES  Aged Out    Health Maintenance  Health Maintenance Due  Topic Date Due  . Hepatitis C Screening  Never done  . COVID-19 Vaccine (3 - Booster for Pfizer series) 02/16/2020  . TETANUS/TDAP  07/07/2020     Colorectal cancer screening: Type of screening: Colonoscopy. Completed 02/18/2012. Repeat every 10 years  Mammogram status: Ordered 09/13/2020. Pt provided with contact info and advised to call to schedule appt.   Bone Density status: Ordered 09/13/2020. Pt provided with contact info and advised to call to schedule appt.  Lung Cancer Screening: (Low Dose CT Chest recommended if Age 35-80 years, 30 pack-year currently smoking OR have quit w/in 15years.) does not qualify.   Lung Cancer Screening Referral: N/A   Additional Screening:  Hepatitis C Screening: does qualify;   Vision Screening: Recommended annual ophthalmology exams for early detection of glaucoma and other disorders of the eye. Is the patient up to date with their annual eye exam?  {YES/NO:21197} Who is the provider or what is the name of the office in which the patient attends annual eye exams? *** If pt is not established with a provider, would they like to be referred to a provider to establish care? {YES/NO:21197}.   Dental Screening: Recommended annual dental exams for proper oral hygiene  Community Resource Referral / Chronic Care Management: CRR required this visit?  No   CCM required this visit?  No      Plan:     I have personally reviewed and noted the following in the patient's chart:   . Medical and social history . Use of alcohol, tobacco or illicit drugs  . Current medications and supplements . Functional ability and status . Nutritional status . Physical activity . Advanced directives . List of other physicians . Hospitalizations, surgeries, and ER visits in previous 12 months . Vitals . Screenings to include cognitive, depression, and falls . Referrals and appointments  In addition, I have reviewed and discussed with patient certain preventive protocols, quality metrics, and best practice recommendations. A written personalized care plan for preventive services as well as general  preventive health recommendations were provided to patient.     Ofilia Neas, LPN   12/08/4032   Nurse Notes: None

## 2020-09-13 ENCOUNTER — Ambulatory Visit: Payer: Medicare Other

## 2020-09-13 DIAGNOSIS — Z0000 Encounter for general adult medical examination without abnormal findings: Secondary | ICD-10-CM

## 2020-09-13 NOTE — Progress Notes (Signed)
Erroneous Encounter

## 2020-09-14 DIAGNOSIS — J301 Allergic rhinitis due to pollen: Secondary | ICD-10-CM | POA: Diagnosis not present

## 2020-09-14 DIAGNOSIS — J3089 Other allergic rhinitis: Secondary | ICD-10-CM | POA: Diagnosis not present

## 2020-09-14 DIAGNOSIS — J3081 Allergic rhinitis due to animal (cat) (dog) hair and dander: Secondary | ICD-10-CM | POA: Diagnosis not present

## 2020-09-28 DIAGNOSIS — J301 Allergic rhinitis due to pollen: Secondary | ICD-10-CM | POA: Diagnosis not present

## 2020-09-28 DIAGNOSIS — J3089 Other allergic rhinitis: Secondary | ICD-10-CM | POA: Diagnosis not present

## 2020-09-28 DIAGNOSIS — J3081 Allergic rhinitis due to animal (cat) (dog) hair and dander: Secondary | ICD-10-CM | POA: Diagnosis not present

## 2020-10-11 DIAGNOSIS — M4316 Spondylolisthesis, lumbar region: Secondary | ICD-10-CM | POA: Diagnosis not present

## 2020-10-11 DIAGNOSIS — M48061 Spinal stenosis, lumbar region without neurogenic claudication: Secondary | ICD-10-CM | POA: Diagnosis not present

## 2020-10-11 DIAGNOSIS — M48062 Spinal stenosis, lumbar region with neurogenic claudication: Secondary | ICD-10-CM | POA: Diagnosis not present

## 2020-10-22 DIAGNOSIS — M48062 Spinal stenosis, lumbar region with neurogenic claudication: Secondary | ICD-10-CM | POA: Diagnosis not present

## 2020-10-28 DIAGNOSIS — J301 Allergic rhinitis due to pollen: Secondary | ICD-10-CM | POA: Diagnosis not present

## 2020-10-28 DIAGNOSIS — J3089 Other allergic rhinitis: Secondary | ICD-10-CM | POA: Diagnosis not present

## 2020-10-28 DIAGNOSIS — J3081 Allergic rhinitis due to animal (cat) (dog) hair and dander: Secondary | ICD-10-CM | POA: Diagnosis not present

## 2020-10-31 DIAGNOSIS — J301 Allergic rhinitis due to pollen: Secondary | ICD-10-CM | POA: Diagnosis not present

## 2020-10-31 DIAGNOSIS — J3081 Allergic rhinitis due to animal (cat) (dog) hair and dander: Secondary | ICD-10-CM | POA: Diagnosis not present

## 2020-10-31 DIAGNOSIS — J3089 Other allergic rhinitis: Secondary | ICD-10-CM | POA: Diagnosis not present

## 2020-11-14 DIAGNOSIS — M48062 Spinal stenosis, lumbar region with neurogenic claudication: Secondary | ICD-10-CM | POA: Diagnosis not present

## 2020-11-14 DIAGNOSIS — M4316 Spondylolisthesis, lumbar region: Secondary | ICD-10-CM | POA: Diagnosis not present

## 2020-11-16 DIAGNOSIS — J301 Allergic rhinitis due to pollen: Secondary | ICD-10-CM | POA: Diagnosis not present

## 2020-11-16 DIAGNOSIS — J3089 Other allergic rhinitis: Secondary | ICD-10-CM | POA: Diagnosis not present

## 2020-11-16 DIAGNOSIS — J3081 Allergic rhinitis due to animal (cat) (dog) hair and dander: Secondary | ICD-10-CM | POA: Diagnosis not present

## 2020-12-07 DIAGNOSIS — J301 Allergic rhinitis due to pollen: Secondary | ICD-10-CM | POA: Diagnosis not present

## 2020-12-07 DIAGNOSIS — J3089 Other allergic rhinitis: Secondary | ICD-10-CM | POA: Diagnosis not present

## 2020-12-07 DIAGNOSIS — J3081 Allergic rhinitis due to animal (cat) (dog) hair and dander: Secondary | ICD-10-CM | POA: Diagnosis not present

## 2020-12-18 ENCOUNTER — Telehealth: Payer: Self-pay | Admitting: Adult Health

## 2020-12-18 NOTE — Telephone Encounter (Signed)
Left message for patient to call back and schedule Medicare Annual Wellness Visit (AWV) either virtually or in office.  [facility] 09[facility]/14  please schedule at anytime with [facility] Nurse Health Advisor 1 or 2   This should be a 45 minute visit.

## 2020-12-28 DIAGNOSIS — J3089 Other allergic rhinitis: Secondary | ICD-10-CM | POA: Diagnosis not present

## 2020-12-28 DIAGNOSIS — J3081 Allergic rhinitis due to animal (cat) (dog) hair and dander: Secondary | ICD-10-CM | POA: Diagnosis not present

## 2020-12-28 DIAGNOSIS — J301 Allergic rhinitis due to pollen: Secondary | ICD-10-CM | POA: Diagnosis not present

## 2021-01-02 DIAGNOSIS — J3089 Other allergic rhinitis: Secondary | ICD-10-CM | POA: Diagnosis not present

## 2021-01-02 DIAGNOSIS — J301 Allergic rhinitis due to pollen: Secondary | ICD-10-CM | POA: Diagnosis not present

## 2021-01-02 DIAGNOSIS — J3081 Allergic rhinitis due to animal (cat) (dog) hair and dander: Secondary | ICD-10-CM | POA: Diagnosis not present

## 2021-01-17 DIAGNOSIS — J3081 Allergic rhinitis due to animal (cat) (dog) hair and dander: Secondary | ICD-10-CM | POA: Diagnosis not present

## 2021-01-17 DIAGNOSIS — J3089 Other allergic rhinitis: Secondary | ICD-10-CM | POA: Diagnosis not present

## 2021-01-17 DIAGNOSIS — J301 Allergic rhinitis due to pollen: Secondary | ICD-10-CM | POA: Diagnosis not present

## 2021-01-21 ENCOUNTER — Telehealth: Payer: Self-pay | Admitting: Adult Health

## 2021-01-21 NOTE — Telephone Encounter (Signed)
Left message for patient to call back and schedule Medicare Annual Wellness Visit (AWV) either virtually or in office.    [facility] 09/05/14  please schedule at anytime with [facility] Nurse Health Advisor 1 or 2   This should be a 45 minute visit.

## 2021-01-25 DIAGNOSIS — J3081 Allergic rhinitis due to animal (cat) (dog) hair and dander: Secondary | ICD-10-CM | POA: Diagnosis not present

## 2021-01-25 DIAGNOSIS — J301 Allergic rhinitis due to pollen: Secondary | ICD-10-CM | POA: Diagnosis not present

## 2021-01-25 DIAGNOSIS — J3089 Other allergic rhinitis: Secondary | ICD-10-CM | POA: Diagnosis not present

## 2021-01-29 DIAGNOSIS — J3081 Allergic rhinitis due to animal (cat) (dog) hair and dander: Secondary | ICD-10-CM | POA: Diagnosis not present

## 2021-01-29 DIAGNOSIS — J3089 Other allergic rhinitis: Secondary | ICD-10-CM | POA: Diagnosis not present

## 2021-01-29 DIAGNOSIS — J301 Allergic rhinitis due to pollen: Secondary | ICD-10-CM | POA: Diagnosis not present

## 2021-01-31 DIAGNOSIS — J3081 Allergic rhinitis due to animal (cat) (dog) hair and dander: Secondary | ICD-10-CM | POA: Diagnosis not present

## 2021-01-31 DIAGNOSIS — J3089 Other allergic rhinitis: Secondary | ICD-10-CM | POA: Diagnosis not present

## 2021-01-31 DIAGNOSIS — J301 Allergic rhinitis due to pollen: Secondary | ICD-10-CM | POA: Diagnosis not present

## 2021-02-07 ENCOUNTER — Telehealth: Payer: Self-pay | Admitting: Adult Health

## 2021-02-07 ENCOUNTER — Telehealth: Payer: Self-pay

## 2021-02-07 NOTE — Telephone Encounter (Signed)
Called patient x 3 with no answer.  Patient may reschedule for next available appointment .  L.Catharina Pica,Lpn

## 2021-02-07 NOTE — Progress Notes (Deleted)
[id number]         Subjective:   Jennifer Deleon is a 75 y.o. female who presents for Medicare Annual (Subsequent) preventive examination.   I connected with Jennifer Deleon today by telephone and verified that I am speaking with the correct person using two identifiers. Location patient: home Location provider: work Persons participating in the virtual visit: patient, provider.   I discussed the limitations, risks, security and privacy concerns of performing an evaluation and management service by telephone and the availability of in person appointments. I also discussed with the patient that there may be a patient responsible charge related to this service. The patient expressed understanding and verbally consented to this telephonic visit.    Interactive audio and video telecommunications were attempted between this provider and patient, however failed, due to patient having technical difficulties OR patient did not have access to video capability.  We continued and completed visit with audio only.    Review of Systems    N/a       Objective:    There were no vitals filed for this visit. There is no height or weight on file to calculate BMI.  Advanced Directives 07/01/2019 08/11/2015  Does Patient Have a Medical Advance Directive? Yes No  Type of Paramedic of [place];Living will -  Does patient want to make changes to medical advance directive? No - Patient declined -  Copy of [place] in Chart? No - copy requested -  Would patient like information on creating a medical advance directive? - No - patient declined information    Current Medications (verified) Outpatient Encounter Medications as of 02/07/2021  Medication Sig   azelastine (ASTELIN) 0.1 % nasal spray Place 1 spray into both nostrils 2 (two) times daily. Use in each nostril as directed   beclomethasone (QVAR) 40 MCG/ACT inhaler Inhale 2 puffs into the lungs 2 (two) times  daily.   buPROPion (WELLBUTRIN XL) 300 MG 24 hr tablet Take 300 mg by mouth daily.    calcium-vitamin D (OSCAL-500) 500-400 MG-UNIT per tablet Take 1 tablet by mouth 2 (two) times daily with a meal.   cetirizine (ZYRTEC) 10 MG tablet Take 10 mg by mouth daily.    diphenhydrAMINE (BENADRYL) 25 MG tablet Take 25 mg by mouth every 6 (six) hours as needed for allergies.   docusate sodium (COLACE) 100 MG capsule Take 100 mg by mouth 2 (two) times daily as needed for mild constipation.    DULoxetine (CYMBALTA) 60 MG capsule Take 60 mg by mouth daily.    fexofenadine (ALLER-EASE) 180 MG tablet Take 180 mg by mouth daily.    fluticasone (FLONASE) 50 MCG/ACT nasal spray Place 1 spray into both nostrils 2 (two) times daily.   levothyroxine (SYNTHROID) 112 MCG tablet Take 1 tablet (112 mcg total) by mouth daily.   montelukast (SINGULAIR) 10 MG tablet Take 10 mg by mouth at bedtime.   Multiple Vitamin (MULTIVITAMIN WITH MINERALS) TABS Take 1 tablet by mouth daily.    pregabalin (LYRICA) 75 MG capsule Take 75 mg by mouth 2 (two) times daily.   No facility-administered encounter medications on file as of 02/07/2021.    Allergies (verified) Augmentin [amoxicillin-pot clavulanate] and Bactrim [sulfamethoxazole-trimethoprim]   History: Past Medical History:  Diagnosis Date   Arthritis    Asthma    Chicken pox    Colon polyps    Depression    Environmental allergies    allergies all year long   Hypothyroid  Osteoporosis    UTI (lower urinary tract infection)    Past Surgical History:  Procedure Laterality Date   CATARACT EXTRACTION Bilateral 2016   LAMINECTOMY     LUMBAR FUSION  2015   l4-L5   TUBAL LIGATION     Family History  Problem Relation Age of Onset   Osteoarthritis Mother    Osteoarthritis Maternal Grandmother    Osteoarthritis Father    Hypertension Father    Heart failure Father    Prostate cancer Paternal Grandfather        mets to colon   Arthritis Other    Prostate  cancer Other    Mental illness Other    Social History   Socioeconomic History   Marital status: Married    Spouse name: Not on file   Number of children: Not on file   Years of education: Not on file   Highest education level: Not on file  Occupational History   Not on file  Tobacco Use   Smoking status: Never   Smokeless tobacco: Never  Vaping Use   Vaping Use: Never used  Substance and Sexual Activity   Alcohol use: Yes    Alcohol/week: 7.0 standard drinks    Types: 7 Glasses of wine per week    Comment: a glass of wine with dinner each night   Drug use: No   Sexual activity: Not on file  Other Topics Concern   Not on file  Social History Narrative   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ighter    Married for  11 years    Children who live in [place] Determinants of Health   Financial Resource Strain: Not on file  Food Insecurity: Not on file  Transportation Needs: Not on file  Physical Activity: Not on file  Stress: Not on file  Social Connections: Not on file    Tobacco Counseling Counseling given: Not Answered   Clinical Intake:                 Diabetic?no         Activities of Daily Living No flowsheet data found.  Patient Care Team: [name], NP as PCP - General (Family Medicine) Tiajuana Amass, MD as Referring Physician (Allergy and Immunology) Leta Baptist, MD as Consulting Physician (Otolaryngology) Zonia Kief, MD (Rehabilitation) [facility] (Neurology)  Indicate any recent Medical Services you may have received from other than [facility] providers in the past year (date may be approximate).     Assessment:   This is a routine wellness examination for Jennifer Deleon.  Hearing/Vision screen No results found.  Dietary issues and exercise activities discussed:     Goals Addressed   None    Depression Screen PHQ 2/9 Scores 01/20/2017 04/05/2015  PHQ - 2 Score 0 0    Fall Risk Fall Risk  05/11/2019 02/07/2019  02/02/2017 01/20/2017  Falls in the past year? 0 (No Data) Yes Yes  Comment - Emmi Telephone Survey: data to providers prior to load Emmi Telephone Survey: data to providers prior to load -  Number falls in past yr: 0 (No Data) 1 1  Comment - Emmi Telephone Survey Actual Response =  Emmi Telephone Survey Actual Response = 1 -  Injury with Fall? 0 - Yes Yes  Risk for fall due to : - - - Impaired balance/gait  Follow up Falls evaluation completed - - Education provided    [place]:  Any stairs in or around the home? {YES/NO:21197} If so, are there any without handrails? {YES/NO:21197} Home free of loose throw rugs in walkways, pet beds, electrical cords, etc? {YES/NO:21197} Adequate lighting in your home to reduce risk of falls? {YES/NO:21197}  ASSISTIVE DEVICES UTILIZED TO PREVENT FALLS:  Life alert? {YES/NO:21197} Use of a cane, walker or w/c? {YES/NO:21197} Grab bars in the bathroom? {YES/NO:21197} Shower chair or bench in shower? {YES/NO:21197} Elevated toilet seat or a handicapped toilet? {YES/NO:21197}   Cognitive Function:Normal cognitive status assessed by direct observation by this Nurse Health Advisor. No abnormalities found.          Immunizations Immunization History  Administered Date(s) Administered   Fluad Quad(high Dose 65+) 05/11/2019   Influenza, High Dose Seasonal PF 08/22/2013, 10/19/2014, 05/03/2015, 04/21/2016, 04/21/2017, 07/21/2017, 07/20/2019, 07/19/2020   Influenza-Unspecified 03/06/2015, 04/20/2020   PFIZER(Purple Top)SARS-COV-2 Vaccination 07/29/2019, 08/19/2019, 07/19/2020   Pneumococcal Conjugate-13 04/21/2017   Pneumococcal Polysaccharide-23 07/07/2012, 05/03/2015, 12/20/2015, 09/18/2016, 07/21/2017, 07/19/2018, 07/20/2019, 07/19/2020   Tdap 07/07/2010    TDAP status: Due, Education has been provided regarding the importance of this vaccine. Advised may receive this vaccine at local pharmacy or Health Dept. Aware  to provide a copy of the vaccination record if obtained from local pharmacy or Health Dept. Verbalized acceptance and understanding.  Flu Vaccine status: Up to date  Pneumococcal vaccine status: Up to date  Covid-19 vaccine status: Completed vaccines  Qualifies for Shingles Vaccine? Yes   Zostavax completed No   Shingrix Completed?: No.    Education has been provided regarding the importance of this vaccine. Patient has been advised to call insurance company to determine out of pocket expense if they have not yet received this vaccine. Advised may also receive vaccine at local pharmacy or Health Dept. Verbalized acceptance and understanding.  Screening Tests Health Maintenance  Topic Date Due   Hepatitis C Screening  Never done   Zoster Vaccines- Shingrix (1 of 2) Never done   TETANUS/TDAP  07/07/2020   COVID-19 Vaccine (4 - Booster for [employer] series) 11/16/2020   INFLUENZA VACCINE  02/04/2021   DEXA SCAN  Completed   PNA vac Low Risk Adult  Completed   HPV VACCINES  Aged Out    Health Maintenance  Health Maintenance Due  Topic Date Due   Hepatitis C Screening  Never done   Zoster Vaccines- Shingrix (1 of 2) Never done   TETANUS/TDAP  07/07/2020   COVID-19 Vaccine (4 - Booster for Pfizer series) 11/16/2020   INFLUENZA VACCINE  02/04/2021    Colorectal cancer screening: No longer required.   Mammogram status: No longer required due to age.  Bone Density status: Completed 02/27/2014. Results reflect: Bone density results: OSTEOPENIA. Repeat every 5 years.  Lung Cancer Screening: (Low Dose CT Chest recommended if Age 62-80 years, 30 pack-year currently smoking OR have quit w/in 15years.) does not qualify.   Lung Cancer Screening Referral: n/a  Additional Screening:  Hepatitis C Screening: does qualify;   Vision Screening: Recommended annual ophthalmology exams for early detection of glaucoma and other disorders of the eye. Is the patient up to date with their annual  eye exam?  {YES/NO:21197} Who is the provider or what is the name of the office in which the patient attends annual eye exams? *** If pt is not established with a provider, would they like to be referred to a provider to establish care? {YES/NO:21197}.   Dental Screening: Recommended annual dental exams for proper oral hygiene  Community Resource Referral / Chronic Care  Management: CRR required this visit?  No   CCM required this visit?  No      Plan:     I have personally reviewed and noted the following in the patient's chart:   Medical and social history Use of alcohol, tobacco or illicit drugs  Current medications and supplements including opioid prescriptions.  Functional ability and status Nutritional status Physical activity Advanced directives List of other physicians Hospitalizations, surgeries, and ER visits in previous 12 months Vitals Screenings to include cognitive, depression, and falls Referrals and appointments  In addition, I have reviewed and discussed with patient certain preventive protocols, quality metrics, and best practice recommendations. A written personalized care plan for preventive services as well as general preventive health recommendations were provided to patient.     Randel Pigg, LPN   [id number]   Nurse Notes: none

## 2021-02-07 NOTE — Telephone Encounter (Signed)
Left message asking pt to call [phone]  Mickel Baas had to leave early today and patient needs to r/s 02/07/21 AWV appointment

## 2021-02-13 DIAGNOSIS — J301 Allergic rhinitis due to pollen: Secondary | ICD-10-CM | POA: Diagnosis not present

## 2021-02-13 DIAGNOSIS — J3089 Other allergic rhinitis: Secondary | ICD-10-CM | POA: Diagnosis not present

## 2021-02-13 DIAGNOSIS — J3081 Allergic rhinitis due to animal (cat) (dog) hair and dander: Secondary | ICD-10-CM | POA: Diagnosis not present

## 2021-02-15 ENCOUNTER — Telehealth: Payer: Self-pay | Admitting: Adult Health

## 2021-02-15 NOTE — Telephone Encounter (Signed)
Left message for patient to call back and schedule Medicare Annual Wellness Visit (AWV) either virtually or in office.  Left both  my jabber number [phone] and office number   [facility] 09/05/14   please schedule at anytime with [facility] Nurse Health Advisor 1 or 2   This should be a 45 minute visit.

## 2021-02-28 DIAGNOSIS — J301 Allergic rhinitis due to pollen: Secondary | ICD-10-CM | POA: Diagnosis not present

## 2021-02-28 DIAGNOSIS — J3089 Other allergic rhinitis: Secondary | ICD-10-CM | POA: Diagnosis not present

## 2021-02-28 DIAGNOSIS — J3081 Allergic rhinitis due to animal (cat) (dog) hair and dander: Secondary | ICD-10-CM | POA: Diagnosis not present

## 2021-03-05 ENCOUNTER — Ambulatory Visit (INDEPENDENT_AMBULATORY_CARE_PROVIDER_SITE_OTHER): Payer: Medicare Other

## 2021-03-05 DIAGNOSIS — Z780 Asymptomatic menopausal state: Secondary | ICD-10-CM

## 2021-03-05 DIAGNOSIS — J3081 Allergic rhinitis due to animal (cat) (dog) hair and dander: Secondary | ICD-10-CM | POA: Diagnosis not present

## 2021-03-05 DIAGNOSIS — Z0000 Encounter for general adult medical examination without abnormal findings: Secondary | ICD-10-CM | POA: Diagnosis not present

## 2021-03-05 DIAGNOSIS — J3089 Other allergic rhinitis: Secondary | ICD-10-CM | POA: Diagnosis not present

## 2021-03-05 DIAGNOSIS — J301 Allergic rhinitis due to pollen: Secondary | ICD-10-CM | POA: Diagnosis not present

## 2021-03-05 NOTE — Patient Instructions (Signed)
Ms. Jennifer Deleon , Thank you for taking time to come for your Medicare Wellness Visit. I appreciate your ongoing commitment to your health goals. Please review the following plan we discussed and let me know if I can assist you in the future.   Screening recommendations/referrals: Colonoscopy: no longer required  Mammogram: no longer required  Bone Density: ordered  Recommended yearly ophthalmology/optometry visit for glaucoma screening and checkup Recommended yearly dental visit for hygiene and checkup  Vaccinations: Influenza vaccine: due in fall 2022  Pneumococcal vaccine: completed series  Tdap vaccine: due upon injury  Shingles vaccine: declined     Advanced directives: will provide copies   Conditions/risks identified: none   Next appointment: none    Preventive Care 77 Years and Older, Female Preventive care refers to lifestyle choices and visits with your health care provider that can promote health and wellness. What does preventive care include? A yearly physical exam. This is also called an annual well check. Dental exams once or twice a year. Routine eye exams. Ask your health care provider how often you should have your eyes checked. Personal lifestyle choices, including: Daily care of your teeth and gums. Regular physical activity. Eating a healthy diet. Avoiding tobacco and drug use. Limiting alcohol use. Practicing safe sex. Taking low-dose aspirin every day. Taking vitamin and mineral supplements as recommended by your health care provider. What happens during an annual well check? The services and screenings done by your health care provider during your annual well check will depend on your age, overall health, lifestyle risk factors, and family history of disease. Counseling  Your health care provider may ask you questions about your: Alcohol use. Tobacco use. Drug use. Emotional well-being. Home and relationship well-being. Sexual activity. Eating  habits. History of falls. Memory and ability to understand (cognition). Work and work Statistician. Reproductive health. Screening  You may have the following tests or measurements: Height, weight, and BMI. Blood pressure. Lipid and cholesterol levels. These may be checked every 5 years, or more frequently if you are over 64 years old. Skin check. Lung cancer screening. You may have this screening every year starting at age 49 if you have a 30-pack-year history of smoking and currently smoke or have quit within the past 15 years. Fecal occult blood test (FOBT) of the stool. You may have this test every year starting at age 64. Flexible sigmoidoscopy or colonoscopy. You may have a sigmoidoscopy every 5 years or a colonoscopy every 10 years starting at age 77. Hepatitis C blood test. Hepatitis B blood test. Sexually transmitted disease (STD) testing. Diabetes screening. This is done by checking your blood sugar (glucose) after you have not eaten for a while (fasting). You may have this done every 1-3 years. Bone density scan. This is done to screen for osteoporosis. You may have this done starting at age 52. Mammogram. This may be done every 1-2 years. Talk to your health care provider about how often you should have regular mammograms. Talk with your health care provider about your test results, treatment options, and if necessary, the need for more tests. Vaccines  Your health care provider may recommend certain vaccines, such as: Influenza vaccine. This is recommended every year. Tetanus, diphtheria, and acellular pertussis (Tdap, Td) vaccine. You may need a Td booster every 10 years. Zoster vaccine. You may need this after age 71. Pneumococcal 13-valent conjugate (PCV13) vaccine. One dose is recommended after age 77. Pneumococcal polysaccharide (PPSV23) vaccine. One dose is recommended after age 77. Talk  to your health care provider about which screenings and vaccines you need and how  often you need them. This information is not intended to replace advice given to you by your health care provider. Make sure you discuss any questions you have with your health care provider. Document Released: 07/20/2015 Document Revised: 03/12/2016 Document Reviewed: 04/24/2015 Elsevier Interactive Patient Education  2017 [place] Prevention in the Home Falls can cause injuries. They can happen to people of all ages. There are many things you can do to make your home safe and to help prevent falls. What can I do on the outside of my home? Regularly fix the edges of walkways and driveways and fix any cracks. Remove anything that might make you trip as you walk through a door, such as a raised step or threshold. Trim any bushes or trees on the path to your home. Use bright outdoor lighting. Clear any walking paths of anything that might make someone trip, such as rocks or tools. Regularly check to see if handrails are loose or broken. Make sure that both sides of any steps have handrails. Any raised decks and porches should have guardrails on the edges. Have any leaves, snow, or ice cleared regularly. Use sand or salt on walking paths during winter. Clean up any spills in your garage right away. This includes oil or grease spills. What can I do in the bathroom? Use night lights. Install grab bars by the toilet and in the tub and shower. Do not use towel bars as grab bars. Use non-skid mats or decals in the tub or shower. If you need to sit down in the shower, use a plastic, non-slip stool. Keep the floor dry. Clean up any water that spills on the floor as soon as it happens. Remove soap buildup in the tub or shower regularly. Attach bath mats securely with double-sided non-slip rug tape. Do not have throw rugs and other things on the floor that can make you trip. What can I do in the bedroom? Use night lights. Make sure that you have a light by your bed that is easy to  reach. Do not use any sheets or blankets that are too big for your bed. They should not hang down onto the floor. Have a firm chair that has side arms. You can use this for support while you get dressed. Do not have throw rugs and other things on the floor that can make you trip. What can I do in the kitchen? Clean up any spills right away. Avoid walking on wet floors. Keep items that you use a lot in easy-to-reach places. If you need to reach something above you, use a strong step stool that has a grab bar. Keep electrical cords out of the way. Do not use floor polish or wax that makes floors slippery. If you must use wax, use non-skid floor wax. Do not have throw rugs and other things on the floor that can make you trip. What can I do with my stairs? Do not leave any items on the stairs. Make sure that there are handrails on both sides of the stairs and use them. Fix handrails that are broken or loose. Make sure that handrails are as long as the stairways. Check any carpeting to make sure that it is firmly attached to the stairs. Fix any carpet that is loose or worn. Avoid having throw rugs at the top or bottom of the stairs. If you do have throw rugs,  attach them to the floor with carpet tape. Make sure that you have a light switch at the top of the stairs and the bottom of the stairs. If you do not have them, ask someone to add them for you. What else can I do to help prevent falls? Wear shoes that: Do not have high heels. Have rubber bottoms. Are comfortable and fit you well. Are closed at the toe. Do not wear sandals. If you use a stepladder: Make sure that it is fully opened. Do not climb a closed stepladder. Make sure that both sides of the stepladder are locked into place. Ask someone to hold it for you, if possible. Clearly mark and make sure that you can see: Any grab bars or handrails. First and last steps. Where the edge of each step is. Use tools that help you move  around (mobility aids) if they are needed. These include: Canes. Walkers. Scooters. Crutches. Turn on the lights when you go into a dark area. Replace any light bulbs as soon as they burn out. Set up your furniture so you have a clear path. Avoid moving your furniture around. If any of your floors are uneven, fix them. If there are any pets around you, be aware of where they are. Review your medicines with your doctor. Some medicines can make you feel dizzy. This can increase your chance of falling. Ask your doctor what other things that you can do to help prevent falls. This information is not intended to replace advice given to you by your health care provider. Make sure you discuss any questions you have with your health care provider. Document Released: 04/19/2009 Document Revised: 11/29/2015 Document Reviewed: 07/28/2014 Elsevier Interactive Patient Education  2017 [employer].

## 2021-03-05 NOTE — Progress Notes (Signed)
Subjective:   Jennifer Deleon is a 75 y.o. female who presents for Medicare Annual (Subsequent) preventive examination.  Patient unable to connect to my chart. I connected with Jennifer Deleon today by telephone and verified that I am speaking with the correct person using two identifiers. Location patient: home Location provider: work Persons participating in the virtual visit: patient, provider.   I discussed the limitations, risks, security and privacy concerns of performing an evaluation and management service by telephone and the availability of in person appointments. I also discussed with the patient that there may be a patient responsible charge related to this service. The patient expressed understanding and verbally consented to this telephonic visit.    Interactive audio and video telecommunications were attempted between this provider and patient, however failed, due to patient having technical difficulties OR patient did not have access to video capability.  We continued and completed visit with audio only.     Review of Systems    N/a       Objective:    There were no vitals filed for this visit. There is no height or weight on file to calculate BMI.  Advanced Directives 07/01/2019 08/11/2015  Does Patient Have a Medical Advance Directive? Yes No  Type of Paramedic of [place];Living will -  Does patient want to make changes to medical advance directive? No - Patient declined -  Copy of [place] in Chart? No - copy requested -  Would patient like information on creating a medical advance directive? - No - patient declined information    Current Medications (verified) Outpatient Encounter Medications as of 03/05/2021  Medication Sig   azelastine (ASTELIN) 0.1 % nasal spray Place 1 spray into both nostrils 2 (two) times daily. Use in each nostril as directed   beclomethasone (QVAR) 40 MCG/ACT inhaler Inhale 2 puffs into the  lungs 2 (two) times daily.   buPROPion (WELLBUTRIN XL) 300 MG 24 hr tablet Take 300 mg by mouth daily.    calcium-vitamin D (OSCAL-500) 500-400 MG-UNIT per tablet Take 1 tablet by mouth 2 (two) times daily with a meal.   cetirizine (ZYRTEC) 10 MG tablet Take 10 mg by mouth daily.    diphenhydrAMINE (BENADRYL) 25 MG tablet Take 25 mg by mouth every 6 (six) hours as needed for allergies.   docusate sodium (COLACE) 100 MG capsule Take 100 mg by mouth 2 (two) times daily as needed for mild constipation.    DULoxetine (CYMBALTA) 60 MG capsule Take 60 mg by mouth daily.    fexofenadine (ALLER-EASE) 180 MG tablet Take 180 mg by mouth daily.    fluticasone (FLONASE) 50 MCG/ACT nasal spray Place 1 spray into both nostrils 2 (two) times daily.   levothyroxine (SYNTHROID) 112 MCG tablet Take 1 tablet (112 mcg total) by mouth daily.   montelukast (SINGULAIR) 10 MG tablet Take 10 mg by mouth at bedtime.   Multiple Vitamin (MULTIVITAMIN WITH MINERALS) TABS Take 1 tablet by mouth daily.    pregabalin (LYRICA) 75 MG capsule Take 75 mg by mouth 2 (two) times daily.   No facility-administered encounter medications on file as of 03/05/2021.    Allergies (verified) Augmentin [amoxicillin-pot clavulanate] and Bactrim [sulfamethoxazole-trimethoprim]   History: Past Medical History:  Diagnosis Date   Arthritis    Asthma    Chicken pox    Colon polyps    Depression    Environmental allergies    allergies all year long   Hypothyroid  Osteoporosis    UTI (lower urinary tract infection)    Past Surgical History:  Procedure Laterality Date   CATARACT EXTRACTION Bilateral 2016   LAMINECTOMY     LUMBAR FUSION  2015   l4-L5   TUBAL LIGATION     Family History  Problem Relation Age of Onset   Osteoarthritis Mother    Osteoarthritis Maternal Grandmother    Osteoarthritis Father    Hypertension Father    Heart failure Father    Prostate cancer Paternal Grandfather        mets to colon   Arthritis  Other    Prostate cancer Other    Mental illness Other    Social History   Socioeconomic History   Marital status: Married    Spouse name: Not on file   Number of children: Not on file   Years of education: Not on file   Highest education level: Not on file  Occupational History   Not on file  Tobacco Use   Smoking status: Never   Smokeless tobacco: Never  Vaping Use   Vaping Use: Never used  Substance and Sexual Activity   Alcohol use: Yes    Alcohol/week: 7.0 standard drinks    Types: 7 Glasses of wine per week    Comment: a glass of wine with dinner each night   Drug use: No   Sexual activity: Not on file  Other Topics Concern   Not on file  Social History Narrative   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[employer]    Married for  11 years    Children who live in [place] Determinants of Health   Financial Resource Strain: Not on file  Food Insecurity: Not on file  Transportation Needs: Not on file  Physical Activity: Not on file  Stress: Not on file  Social Connections: Not on file    Tobacco Counseling Counseling given: Not Answered   Clinical Intake:                 Diabetic?no         Activities of Daily Living No flowsheet data found.  Patient Care Team: [name], NP as PCP - General (Family Medicine) Tiajuana Amass, MD as Referring Physician (Allergy and Immunology) Leta Baptist, MD as Consulting Physician (Otolaryngology) Zonia Kief, MD (Rehabilitation) [facility] (Neurology)  Indicate any recent Medical Services you may have received from other than [facility] providers in the past year (date may be approximate).     Assessment:   This is a routine wellness examination for Jennifer Deleon.  Hearing/Vision screen No results found.  Dietary issues and exercise activities discussed:     Goals Addressed   None    Depression Screen PHQ 2/9 Scores 01/20/2017 04/05/2015  PHQ - 2 Score 0 0    Fall Risk Fall Risk   05/11/2019 02/07/2019 02/02/2017 01/20/2017  Falls in the past year? 0 (No Data) Yes Yes  Comment - Emmi Telephone Survey: data to providers prior to load Emmi Telephone Survey: data to providers prior to load -  Number falls in past yr: 0 (No Data) 1 1  Comment - Emmi Telephone Survey Actual Response =  Emmi Telephone Survey Actual Response = 1 -  Injury with Fall? 0 - Yes Yes  Risk for fall due to : - - - Impaired balance/gait  Follow up Falls evaluation completed - - Education provided    [place]:  Any stairs in or around the home? Yes  If so, are there any without handrails? No  Home free of loose throw rugs in walkways, pet beds, electrical cords, etc? Yes  Adequate lighting in your home to reduce risk of falls? Yes   ASSISTIVE DEVICES UTILIZED TO PREVENT FALLS:  Life alert? No  Use of a cane, walker or w/c? No  Grab bars in the bathroom? No  Shower chair or bench in shower? No  Elevated toilet seat or a handicapped toilet? No    Cognitive Function:    Normal cognitive status assessed by direct observation by this Nurse Health Advisor. No abnormalities found.      Immunizations Immunization History  Administered Date(s) Administered   Fluad Quad(high Dose 65+) 05/11/2019   Influenza, High Dose Seasonal PF 08/22/2013, 10/19/2014, 05/03/2015, 04/21/2016, 04/21/2017, 07/21/2017, 07/20/2019, 07/19/2020   Influenza-Unspecified 03/06/2015, 04/20/2020   PFIZER(Purple Top)SARS-COV-2 Vaccination 07/29/2019, 08/19/2019, 07/19/2020   Pneumococcal Conjugate-13 04/21/2017   Pneumococcal Polysaccharide-23 07/07/2012, 05/03/2015, 12/20/2015, 09/18/2016, 07/21/2017, 07/19/2018, 07/20/2019, 07/19/2020   Tdap 07/07/2010    TDAP status: Due, Education has been provided regarding the importance of this vaccine. Advised may receive this vaccine at local pharmacy or Health Dept. Aware to provide a copy of the vaccination record if obtained from local pharmacy  or Health Dept. Verbalized acceptance and understanding.  Flu Vaccine status: Up to date  Pneumococcal vaccine status: Up to date  Covid-19 vaccine status: Completed vaccines  Qualifies for Shingles Vaccine? Yes   Zostavax completed No   Shingrix Completed?: No.    Education has been provided regarding the importance of this vaccine. Patient has been advised to call insurance company to determine out of pocket expense if they have not yet received this vaccine. Advised may also receive vaccine at local pharmacy or Health Dept. Verbalized acceptance and understanding.  Screening Tests Health Maintenance  Topic Date Due   Hepatitis C Screening  Never done   Zoster Vaccines- Shingrix (1 of 2) Never done   TETANUS/TDAP  07/07/2020   COVID-19 Vaccine (4 - Booster for [employer] series) 11/16/2020   INFLUENZA VACCINE  02/04/2021   DEXA SCAN  Completed   PNA vac Low Risk Adult  Completed   HPV VACCINES  Aged Out    Health Maintenance  Health Maintenance Due  Topic Date Due   Hepatitis C Screening  Never done   Zoster Vaccines- Shingrix (1 of 2) Never done   TETANUS/TDAP  07/07/2020   COVID-19 Vaccine (4 - Booster for [employer] series) 11/16/2020   INFLUENZA VACCINE  02/04/2021    Colorectal cancer screening: No longer required.   Mammogram status: No longer required due to age.  Bone Density status: Ordered 03/05/2021. Pt provided with contact info and advised to call to schedule appt.  Lung Cancer Screening: (Low Dose CT Chest recommended if Age 22-80 years, 30 pack-year currently smoking OR have quit w/in 15years.) does not qualify.   Lung Cancer Screening Referral: n/a  Additional Screening:  Hepatitis C Screening: does qualify;   Vision Screening: Recommended annual ophthalmology exams for early detection of glaucoma and other disorders of the eye. Is the patient up to date with their annual eye exam?  Yes  Who is the provider or what is the name of the office in which the  patient attends annual eye exams? [facility]  If pt is not established with a provider, would they like to be referred to a provider to establish care? No .   Dental Screening: Recommended  annual dental exams for proper oral hygiene  Community Resource Referral / Chronic Care Management: CRR required this visit?  No   CCM required this visit?  No      Plan:     I have personally reviewed and noted the following in the patient's chart:   Medical and social history Use of alcohol, tobacco or illicit drugs  Current medications and supplements including opioid prescriptions.  Functional ability and status Nutritional status Physical activity Advanced directives List of other physicians Hospitalizations, surgeries, and ER visits in previous 12 months Vitals Screenings to include cognitive, depression, and falls Referrals and appointments  In addition, I have reviewed and discussed with patient certain preventive protocols, quality metrics, and best practice recommendations. A written personalized care plan for preventive services as well as general preventive health recommendations were provided to patient.     Randel Pigg, LPN   [id number]   Nurse Notes: none

## 2021-03-08 DIAGNOSIS — J3081 Allergic rhinitis due to animal (cat) (dog) hair and dander: Secondary | ICD-10-CM | POA: Diagnosis not present

## 2021-03-08 DIAGNOSIS — J3089 Other allergic rhinitis: Secondary | ICD-10-CM | POA: Diagnosis not present

## 2021-03-08 DIAGNOSIS — J301 Allergic rhinitis due to pollen: Secondary | ICD-10-CM | POA: Diagnosis not present

## 2021-03-22 DIAGNOSIS — J3089 Other allergic rhinitis: Secondary | ICD-10-CM | POA: Diagnosis not present

## 2021-03-22 DIAGNOSIS — J301 Allergic rhinitis due to pollen: Secondary | ICD-10-CM | POA: Diagnosis not present

## 2021-03-22 DIAGNOSIS — J3081 Allergic rhinitis due to animal (cat) (dog) hair and dander: Secondary | ICD-10-CM | POA: Diagnosis not present

## 2021-04-04 DIAGNOSIS — J3089 Other allergic rhinitis: Secondary | ICD-10-CM | POA: Diagnosis not present

## 2021-04-04 DIAGNOSIS — J3081 Allergic rhinitis due to animal (cat) (dog) hair and dander: Secondary | ICD-10-CM | POA: Diagnosis not present

## 2021-04-04 DIAGNOSIS — J301 Allergic rhinitis due to pollen: Secondary | ICD-10-CM | POA: Diagnosis not present

## 2021-04-16 DIAGNOSIS — J301 Allergic rhinitis due to pollen: Secondary | ICD-10-CM | POA: Diagnosis not present

## 2021-04-16 DIAGNOSIS — Z23 Encounter for immunization: Secondary | ICD-10-CM | POA: Diagnosis not present

## 2021-04-16 DIAGNOSIS — J3089 Other allergic rhinitis: Secondary | ICD-10-CM | POA: Diagnosis not present

## 2021-04-16 DIAGNOSIS — J3081 Allergic rhinitis due to animal (cat) (dog) hair and dander: Secondary | ICD-10-CM | POA: Diagnosis not present

## 2021-04-24 ENCOUNTER — Ambulatory Visit (INDEPENDENT_AMBULATORY_CARE_PROVIDER_SITE_OTHER): Payer: Medicare Other | Admitting: Adult Health

## 2021-04-24 ENCOUNTER — Other Ambulatory Visit: Payer: Self-pay

## 2021-04-24 ENCOUNTER — Encounter: Payer: Self-pay | Admitting: Adult Health

## 2021-04-24 VITALS — BP 100/60 | HR 64 | Temp 97.8°F | Ht 63.0 in | Wt 115.0 lb

## 2021-04-24 DIAGNOSIS — Z13220 Encounter for screening for lipoid disorders: Secondary | ICD-10-CM

## 2021-04-24 DIAGNOSIS — Z1159 Encounter for screening for other viral diseases: Secondary | ICD-10-CM

## 2021-04-24 DIAGNOSIS — E032 Hypothyroidism due to medicaments and other exogenous substances: Secondary | ICD-10-CM

## 2021-04-24 DIAGNOSIS — Z136 Encounter for screening for cardiovascular disorders: Secondary | ICD-10-CM

## 2021-04-24 DIAGNOSIS — M5440 Lumbago with sciatica, unspecified side: Secondary | ICD-10-CM

## 2021-04-24 DIAGNOSIS — G8929 Other chronic pain: Secondary | ICD-10-CM | POA: Diagnosis not present

## 2021-04-24 MED ORDER — LEVOTHYROXINE SODIUM 112 MCG PO TABS: 112.0000 ug | ORAL_TABLET | Freq: Every day | ORAL | 3 refills | Status: DC

## 2021-04-24 NOTE — Patient Instructions (Signed)
It was great seeing you today   I have sent in your synthroid  Please schedule a lab appointment. We will follow up with you regarding your blood work

## 2021-04-24 NOTE — Progress Notes (Signed)
Subjective:    Patient ID: Jennifer Deleon, female    DOB: 03-04-1946, 75 y.o.   MRN: [id number]  HPI Patient presents for yearly preventative medicine examination. She is a pleasant 75 year old female who  has a past medical history of Arthritis, Asthma, Chicken pox, Colon polyps, Depression, Environmental allergies, Hypothyroid, Osteoporosis, and UTI (lower urinary tract infection).  Hypothyroidism-takes Synthroid 125 mcg daily.  She does feel well controlled on this medication.  Chronic seasonal allergies-is managed by allergy and asthma.  She does receive allergy shots.  Feels well controlled on her current regimen   Chronic Low Back Pain with sciatica-she is followed by orthopedics, [facility].  Currently prescribed Lyrica 95 mg twice daily.  She is also receiving dural steroid injections periodically.  Notes pain pretty much every day, worse with certain activities such as gardening where she is doing a lot of bending and twisting.  All immunizations and health maintenance protocols were reviewed with the patient and needed orders were placed.  Appropriate screening laboratory values were ordered for the patient including screening of hyperlipidemia, renal function and hepatic function.  Medication reconciliation,  past medical history, social history, problem list and allergies were reviewed in detail with the patient  Goals were established with regard to weight loss, exercise, and  diet in compliance with medications  Wt Readings from Last 3 Encounters:  04/24/21 115 lb (52.2 kg)  12/06/19 111 lb (50.3 kg)  07/12/19 115 lb (52.2 kg)    Review of Systems  Constitutional: Negative.   HENT:  Positive for hearing loss.   Eyes: Negative.   Respiratory: Negative.    Cardiovascular: Negative.   Gastrointestinal: Negative.   Endocrine: Negative.   Genitourinary: Negative.   Musculoskeletal:  Positive for back pain.  Skin: Negative.   Allergic/Immunologic: Negative.    Neurological: Negative.   Hematological: Negative.   Psychiatric/Behavioral: Negative.     Past Medical History:  Diagnosis Date   Arthritis    Asthma    Chicken pox    Colon polyps    Depression    Environmental allergies    allergies all year long   Hypothyroid    Osteoporosis    UTI (lower urinary tract infection)     Social History   Socioeconomic History   Marital status: Married    Spouse name: Not on file   Number of children: Not on file   Years of education: Not on file   Highest education level: Not on file  Occupational History   Not on file  Tobacco Use   Smoking status: Never   Smokeless tobacco: Never  Vaping Use   Vaping Use: Never used  Substance and Sexual Activity   Alcohol use: Yes    Alcohol/week: 7.0 standard drinks    Types: 7 Glasses of wine per week    Comment: a glass of wine with dinner each night   Drug use: No   Sexual activity: Not on file  Other Topics Concern   Not on file  Social History Narrative   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[employer]    Married for  11 years    Children who live in [place] Determinants of Health   Financial Resource Strain: Low Risk    Difficulty of Paying Living Expenses: Not hard at all  Food Insecurity: No Food Insecurity   Worried About Charity fundraiser in the Last Year: Never true   [employer]  in the Last Year: Never true  Transportation Needs: No Transportation Needs   Lack of Transportation (Medical): No   Lack of Transportation (Non-Medical): No  Physical Activity: Sufficiently Active   Days of Exercise per Week: 5 days   Minutes of Exercise per Session: 50 min  Stress: No Stress Concern Present   Feeling of Stress : Not at all  Social Connections: Moderately Integrated   Frequency of Communication with Friends and Family: Three times a week   Frequency of Social Gatherings with Friends and Family: Three times a week   Attends Religious Services: Never   Active Member of Clubs  or Organizations: Yes   Attends Music therapist: More than 4 times per year   Marital Status: Married  Human resources officer Violence: Not At Risk   Fear of Current or Ex-Partner: No   Emotionally Abused: No   Physically Abused: No   Sexually Abused: No    Past Surgical History:  Procedure Laterality Date   CATARACT EXTRACTION Bilateral 2016   LAMINECTOMY     LUMBAR FUSION  2015   l4-L5   TUBAL LIGATION      Family History  Problem Relation Age of Onset   Osteoarthritis Mother    Osteoarthritis Maternal Grandmother    Osteoarthritis Father    Hypertension Father    Heart failure Father    Prostate cancer Paternal Grandfather        mets to colon   Arthritis Other    Prostate cancer Other    Mental illness Other     Allergies  Allergen Reactions   Augmentin [Amoxicillin-Pot Clavulanate] Diarrhea and Other (See Comments)    Severe Stomach cramps   Bactrim [Sulfamethoxazole-Trimethoprim] Swelling    Facial and lip swelling    Current Outpatient Medications on File Prior to Visit  Medication Sig Dispense Refill   azelastine (ASTELIN) 0.1 % nasal spray Place 1 spray into both nostrils 2 (two) times daily. Use in each nostril as directed     beclomethasone (QVAR) 40 MCG/ACT inhaler Inhale 2 puffs into the lungs 2 (two) times daily.     buPROPion (WELLBUTRIN XL) 300 MG 24 hr tablet Take 300 mg by mouth daily.      calcium-vitamin D (OSCAL-500) 500-400 MG-UNIT per tablet Take 1 tablet by mouth 2 (two) times daily with a meal.     cetirizine (ZYRTEC) 10 MG tablet Take 10 mg by mouth daily.      diphenhydrAMINE (BENADRYL) 25 MG tablet Take 25 mg by mouth every 6 (six) hours as needed for allergies.     docusate sodium (COLACE) 100 MG capsule Take 100 mg by mouth 2 (two) times daily as needed for mild constipation.      DULoxetine (CYMBALTA) 60 MG capsule Take 60 mg by mouth daily.      fexofenadine (ALLEGRA) 180 MG tablet Take 180 mg by mouth daily.      fluticasone  (FLONASE) 50 MCG/ACT nasal spray Place 1 spray into both nostrils 2 (two) times daily.     montelukast (SINGULAIR) 10 MG tablet Take 10 mg by mouth at bedtime.     Multiple Vitamin (MULTIVITAMIN WITH MINERALS) TABS Take 1 tablet by mouth daily.      pregabalin (LYRICA) 75 MG capsule Take 75 mg by mouth 2 (two) times daily.     EPINEPHrine 0.3 mg/0.3 mL IJ SOAJ injection INJECT INTO THE MIDDLE OF THE OUTER THIGH AND HOLD FOR 3 SECONDS AS NEEDED FOR SEVERE ALLERGIC REACTION  THEN CALL 911 IF USED.     No current facility-administered medications on file prior to visit.    BP 100/60   Pulse 64   Temp 97.8 F (36.6 C) (Oral)   Ht 5\' 3"  (1.6 m)   Wt 115 lb (52.2 kg)   SpO2 95%   BMI 20.37 kg/m        Objective:   Physical Exam Vitals and nursing note reviewed.  Constitutional:      General: She is not in acute distress.    Appearance: Normal appearance. She is well-developed. She is not ill-appearing.  HENT:     Head: Normocephalic and atraumatic.     Right Ear: Tympanic membrane, ear canal and external ear normal. There is no impacted cerumen.     Left Ear: Tympanic membrane, ear canal and external ear normal. There is no impacted cerumen.     Ears:     Comments: Wearing hearing aids     Nose: Nose normal. No congestion or rhinorrhea.     Mouth/Throat:     Mouth: Mucous membranes are moist.     Pharynx: Oropharynx is clear. No oropharyngeal exudate or posterior oropharyngeal erythema.  Eyes:     General:        Right eye: No discharge.        Left eye: No discharge.     Extraocular Movements: Extraocular movements intact.     Conjunctiva/sclera: Conjunctivae normal.     Pupils: Pupils are equal, round, and reactive to light.  Neck:     Thyroid: No thyromegaly.     Vascular: No carotid bruit.     Trachea: No tracheal deviation.  Cardiovascular:     Rate and Rhythm: Normal rate and regular rhythm.     Pulses: Normal pulses.     Heart sounds: Normal heart sounds. No  murmur heard.   No friction rub. No gallop.  Pulmonary:     Effort: Pulmonary effort is normal. No respiratory distress.     Breath sounds: Normal breath sounds. No stridor. No wheezing, rhonchi or rales.  Chest:     Chest wall: No tenderness.  Abdominal:     General: Abdomen is flat. Bowel sounds are normal. There is no distension.     Palpations: Abdomen is soft. There is no mass.     Tenderness: There is no abdominal tenderness. There is no right CVA tenderness, left CVA tenderness, guarding or rebound.     Hernia: No hernia is present.  Musculoskeletal:        General: No swelling, tenderness, deformity or signs of injury. Normal range of motion.     Cervical back: Normal range of motion and neck supple.     Right lower leg: No edema.     Left lower leg: No edema.  Lymphadenopathy:     Cervical: No cervical adenopathy.  Skin:    General: Skin is warm and dry.     Coloration: Skin is not jaundiced or pale.     Findings: No bruising, erythema, lesion or rash.  Neurological:     General: No focal deficit present.     Mental Status: She is alert and oriented to person, place, and time.     Cranial Nerves: No cranial nerve deficit.     Sensory: No sensory deficit.     Motor: No weakness.     Coordination: Coordination normal.     Gait: Gait normal.     Deep Tendon Reflexes: Reflexes normal.  Psychiatric:        Mood and Affect: Mood normal.        Behavior: Behavior normal.        Thought Content: Thought content normal.        Judgment: Judgment normal.      Assessment & Plan:   1. Hypothyroidism due to medication - Consider increase in synthroid  - CBC with Differential/Platelet; Future - Comprehensive metabolic panel; Future - Lipid panel; Future - TSH; Future  2. Encounter for lipid screening for cardiovascular disease - Consider statin  - CBC with Differential/Platelet; Future - Comprehensive metabolic panel; Future - Lipid panel; Future - TSH; Future  3.  Need for hepatitis C screening test   - Hep C Antibody; Future  4. Chronic bilateral low back pain with sciatica, sciatica laterality unspecified - Follow up with orthopedics as directed  [name], NP

## 2021-04-25 ENCOUNTER — Other Ambulatory Visit: Payer: Self-pay

## 2021-04-26 ENCOUNTER — Other Ambulatory Visit (INDEPENDENT_AMBULATORY_CARE_PROVIDER_SITE_OTHER): Payer: Medicare Other

## 2021-04-26 DIAGNOSIS — Z13220 Encounter for screening for lipoid disorders: Secondary | ICD-10-CM

## 2021-04-26 DIAGNOSIS — Z1159 Encounter for screening for other viral diseases: Secondary | ICD-10-CM

## 2021-04-26 DIAGNOSIS — E032 Hypothyroidism due to medicaments and other exogenous substances: Secondary | ICD-10-CM

## 2021-04-26 DIAGNOSIS — Z136 Encounter for screening for cardiovascular disorders: Secondary | ICD-10-CM

## 2021-04-26 LAB — COMPREHENSIVE METABOLIC PANEL
ALT: 14 U/L (ref 0–35)
AST: 20 U/L (ref 0–37)
Albumin: 4.2 g/dL (ref 3.5–5.2)
Alkaline Phosphatase: 64 U/L (ref 39–117)
BUN: 11 mg/dL (ref 6–23)
CO2: 29 mEq/L (ref 19–32)
Calcium: 9.6 mg/dL (ref 8.4–10.5)
Chloride: 103 mEq/L (ref 96–112)
Creatinine, Ser: 0.72 mg/dL (ref 0.40–1.20)
GFR: 80.96 mL/min (ref >60.00)
Glucose, Bld: 85 mg/dL (ref 70–99)
Potassium: 4.3 mEq/L (ref 3.5–5.1)
Sodium: 139 mEq/L (ref 135–145)
Total Bilirubin: 0.7 mg/dL (ref 0.2–1.2)
Total Protein: 6.6 g/dL (ref 6.0–8.3)

## 2021-04-26 LAB — LIPID PANEL
Cholesterol: 219 mg/dL — ABNORMAL HIGH (ref 0–200)
HDL: 87.5 mg/dL (ref >39.00)
LDL Cholesterol: 115 mg/dL — ABNORMAL HIGH (ref 0–99)
NonHDL: 131.92
Total CHOL/HDL Ratio: 3
Triglycerides: 86 mg/dL (ref 0.0–149.0)
VLDL: 17.2 mg/dL (ref 0.0–40.0)

## 2021-04-26 LAB — CBC WITH DIFFERENTIAL/PLATELET
Basophils Absolute: 0 10*3/uL (ref 0.0–0.1)
Basophils Relative: 0.7 % (ref 0.0–3.0)
Eosinophils Absolute: 0.2 10*3/uL (ref 0.0–0.7)
Eosinophils Relative: 2.7 % (ref 0.0–5.0)
HCT: 39 % (ref 36.0–46.0)
Hemoglobin: 13.1 g/dL (ref 12.0–15.0)
Lymphocytes Relative: 24.3 % (ref 12.0–46.0)
Lymphs Abs: 1.5 10*3/uL (ref 0.7–4.0)
MCHC: 33.5 g/dL (ref 30.0–36.0)
MCV: 95.2 fl (ref 78.0–100.0)
Monocytes Absolute: 0.6 10*3/uL (ref 0.1–1.0)
Monocytes Relative: 9.1 % (ref 3.0–12.0)
Neutro Abs: 3.9 10*3/uL (ref 1.4–7.7)
Neutrophils Relative %: 63.2 % (ref 43.0–77.0)
Platelets: 241 10*3/uL (ref 150.0–400.0)
RBC: 4.1 Mil/uL (ref 3.87–5.11)
RDW: 13.1 % (ref 11.5–15.5)
WBC: 6.2 10*3/uL (ref 4.0–10.5)

## 2021-04-26 LAB — TSH: TSH: 3.9 u[IU]/mL (ref 0.35–5.50)

## 2021-04-27 DIAGNOSIS — Z23 Encounter for immunization: Secondary | ICD-10-CM | POA: Diagnosis not present

## 2021-04-29 LAB — HEPATITIS C ANTIBODY
Hepatitis C Ab: NONREACTIVE
SIGNAL TO CUT-OFF: 0.03 (ref <1.00)

## 2021-05-01 DIAGNOSIS — J3081 Allergic rhinitis due to animal (cat) (dog) hair and dander: Secondary | ICD-10-CM | POA: Diagnosis not present

## 2021-05-01 DIAGNOSIS — J301 Allergic rhinitis due to pollen: Secondary | ICD-10-CM | POA: Diagnosis not present

## 2021-05-01 DIAGNOSIS — J3089 Other allergic rhinitis: Secondary | ICD-10-CM | POA: Diagnosis not present

## 2021-05-03 ENCOUNTER — Other Ambulatory Visit: Payer: Self-pay | Admitting: Adult Health

## 2021-05-03 DIAGNOSIS — Z1231 Encounter for screening mammogram for malignant neoplasm of breast: Secondary | ICD-10-CM

## 2021-05-17 DIAGNOSIS — J3089 Other allergic rhinitis: Secondary | ICD-10-CM | POA: Diagnosis not present

## 2021-05-17 DIAGNOSIS — J301 Allergic rhinitis due to pollen: Secondary | ICD-10-CM | POA: Diagnosis not present

## 2021-05-17 DIAGNOSIS — J3081 Allergic rhinitis due to animal (cat) (dog) hair and dander: Secondary | ICD-10-CM | POA: Diagnosis not present

## 2021-05-27 DIAGNOSIS — J3089 Other allergic rhinitis: Secondary | ICD-10-CM | POA: Diagnosis not present

## 2021-05-27 DIAGNOSIS — J301 Allergic rhinitis due to pollen: Secondary | ICD-10-CM | POA: Diagnosis not present

## 2021-05-27 DIAGNOSIS — J3081 Allergic rhinitis due to animal (cat) (dog) hair and dander: Secondary | ICD-10-CM | POA: Diagnosis not present

## 2021-06-04 DIAGNOSIS — M48062 Spinal stenosis, lumbar region with neurogenic claudication: Secondary | ICD-10-CM | POA: Diagnosis not present

## 2021-06-04 DIAGNOSIS — M4316 Spondylolisthesis, lumbar region: Secondary | ICD-10-CM | POA: Diagnosis not present

## 2021-06-12 ENCOUNTER — Other Ambulatory Visit: Payer: Self-pay

## 2021-06-12 ENCOUNTER — Ambulatory Visit
Admission: RE | Admit: 2021-06-12 | Discharge: 2021-06-12 | Disposition: A | Discharge status: Home / Self Care | Payer: Medicare Other | Source: Ambulatory Visit | Attending: Adult Health | Admitting: Adult Health

## 2021-06-12 DIAGNOSIS — Z1231 Encounter for screening mammogram for malignant neoplasm of breast: Secondary | ICD-10-CM

## 2021-06-14 DIAGNOSIS — J301 Allergic rhinitis due to pollen: Secondary | ICD-10-CM | POA: Diagnosis not present

## 2021-06-14 DIAGNOSIS — J3089 Other allergic rhinitis: Secondary | ICD-10-CM | POA: Diagnosis not present

## 2021-06-14 DIAGNOSIS — J3081 Allergic rhinitis due to animal (cat) (dog) hair and dander: Secondary | ICD-10-CM | POA: Diagnosis not present

## 2021-06-17 DIAGNOSIS — M48062 Spinal stenosis, lumbar region with neurogenic claudication: Secondary | ICD-10-CM | POA: Diagnosis not present

## 2021-06-26 DIAGNOSIS — J3089 Other allergic rhinitis: Secondary | ICD-10-CM | POA: Diagnosis not present

## 2021-06-26 DIAGNOSIS — J3081 Allergic rhinitis due to animal (cat) (dog) hair and dander: Secondary | ICD-10-CM | POA: Diagnosis not present

## 2021-06-26 DIAGNOSIS — J301 Allergic rhinitis due to pollen: Secondary | ICD-10-CM | POA: Diagnosis not present

## 2021-07-12 DIAGNOSIS — J3081 Allergic rhinitis due to animal (cat) (dog) hair and dander: Secondary | ICD-10-CM | POA: Diagnosis not present

## 2021-07-12 DIAGNOSIS — J301 Allergic rhinitis due to pollen: Secondary | ICD-10-CM | POA: Diagnosis not present

## 2021-07-12 DIAGNOSIS — J3089 Other allergic rhinitis: Secondary | ICD-10-CM | POA: Diagnosis not present

## 2021-07-16 ENCOUNTER — Telehealth: Payer: Self-pay | Admitting: Adult Health

## 2021-07-16 NOTE — Telephone Encounter (Signed)
Patient called for an appointment with Tommi Rumps, Patient is having symptoms of dark stool, nausea, vomiting, Dizziness, no fever, extremely cramp... for 3 days  Symptoms started early Saturday night and still present  Has not tested for COVID,  Due to symptoms I transferred to the Triage

## 2021-07-17 ENCOUNTER — Telehealth: Payer: Self-pay | Admitting: Adult Health

## 2021-07-17 NOTE — Telephone Encounter (Signed)
Patient's husband called because patient has been having cramping and dark stools since this weekend. Denied triages recommendation of UC or ED. Wanted to know if it was okay to schedule on [facility] schedule this afternoon?

## 2021-07-17 NOTE — Telephone Encounter (Signed)
Patient husband request call at this number when decision is made    [phone]

## 2021-07-17 NOTE — Telephone Encounter (Signed)
She has been scheduled to see Tommi Rumps in the morning

## 2021-07-17 NOTE — Telephone Encounter (Signed)
Please advise 

## 2021-07-18 ENCOUNTER — Ambulatory Visit (INDEPENDENT_AMBULATORY_CARE_PROVIDER_SITE_OTHER): Payer: Medicare Other | Admitting: Adult Health

## 2021-07-18 ENCOUNTER — Encounter: Payer: Self-pay | Admitting: Adult Health

## 2021-07-18 VITALS — BP 120/80 | HR 70 | Temp 97.8°F | Ht 63.0 in | Wt 109.0 lb

## 2021-07-18 DIAGNOSIS — R110 Nausea: Secondary | ICD-10-CM

## 2021-07-18 DIAGNOSIS — R1013 Epigastric pain: Secondary | ICD-10-CM

## 2021-07-18 DIAGNOSIS — R197 Diarrhea, unspecified: Secondary | ICD-10-CM

## 2021-07-18 LAB — CBC WITH DIFFERENTIAL/PLATELET
Basophils Absolute: 0.1 10*3/uL (ref 0.0–0.1)
Basophils Relative: 0.9 % (ref 0.0–3.0)
Eosinophils Absolute: 0.2 10*3/uL (ref 0.0–0.7)
Eosinophils Relative: 3.2 % (ref 0.0–5.0)
HCT: 47.3 % — ABNORMAL HIGH (ref 36.0–46.0)
Hemoglobin: 15.6 g/dL — ABNORMAL HIGH (ref 12.0–15.0)
Lymphocytes Relative: 22.4 % (ref 12.0–46.0)
Lymphs Abs: 1.4 10*3/uL (ref 0.7–4.0)
MCHC: 32.9 g/dL (ref 30.0–36.0)
MCV: 95.4 fl (ref 78.0–100.0)
Monocytes Absolute: 0.7 10*3/uL (ref 0.1–1.0)
Monocytes Relative: 10.3 % (ref 3.0–12.0)
Neutro Abs: 4 10*3/uL (ref 1.4–7.7)
Neutrophils Relative %: 63.2 % (ref 43.0–77.0)
Platelets: 319 10*3/uL (ref 150.0–400.0)
RBC: 4.96 Mil/uL (ref 3.87–5.11)
RDW: 13.6 % (ref 11.5–15.5)
WBC: 6.4 10*3/uL (ref 4.0–10.5)

## 2021-07-18 LAB — BASIC METABOLIC PANEL
BUN: 8 mg/dL (ref 6–23)
CO2: 28 mEq/L (ref 19–32)
Calcium: 10.1 mg/dL (ref 8.4–10.5)
Chloride: 103 mEq/L (ref 96–112)
Creatinine, Ser: 0.88 mg/dL (ref 0.40–1.20)
GFR: 63.53 mL/min (ref >60.00)
Glucose, Bld: 117 mg/dL — ABNORMAL HIGH (ref 70–99)
Potassium: 3.7 mEq/L (ref 3.5–5.1)
Sodium: 141 mEq/L (ref 135–145)

## 2021-07-18 LAB — H. PYLORI ANTIBODY, IGG: H Pylori IgG: NEGATIVE

## 2021-07-18 MED ORDER — ONDANSETRON HCL 4 MG PO TABS: 4.0000 mg | ORAL_TABLET | Freq: Three times a day (TID) | ORAL | 0 refills | Status: DC | PRN

## 2021-07-18 MED ORDER — PANTOPRAZOLE SODIUM 40 MG PO TBEC: 40.0000 mg | DELAYED_RELEASE_TABLET | Freq: Every day | ORAL | 0 refills | Status: DC

## 2021-07-18 MED ORDER — SUCRALFATE 1 G PO TABS: 1.0000 g | ORAL_TABLET | Freq: Three times a day (TID) | ORAL | 0 refills | Status: DC

## 2021-07-18 NOTE — Patient Instructions (Addendum)
°  Take the Protonix every morning for 30 days   Start the Carafate one hour later and take for the next 5 day   Zofran can be taken every 8 hours as needed for relief of nausea   I will follow up with you regarding your lab work

## 2021-07-18 NOTE — Progress Notes (Signed)
Subjective:    Patient ID: Jennifer Deleon, female    DOB: Oct 13, 1945, 76 y.o.   MRN: [id number]  HPI 76 year old female who  has a past medical history of Arthritis, Asthma, Chicken pox, Colon polyps, Depression, Environmental allergies, Hypothyroid, Osteoporosis, and UTI (lower urinary tract infection).  She presents to the office today for concern of blood in stool. She reports that five days ago she woke up with abdominal pain and diarrhea. She reports that her stools have been " very very dark black/brown" She has not been able to eat much due to the abdominal cramping and nausea. Denies vomiting/fevers/chills/has not eaten any foods out of the ordinary.   She reports feeling fatigued and sleeping a lot. " I just feel really bad"   She did take pepto bismol a few days ago.   No abx use recently. Does not use NSAIDS routinely.    Review of Systems See HPI   Past Medical History:  Diagnosis Date   Arthritis    Asthma    Chicken pox    Colon polyps    Depression    Environmental allergies    allergies all year long   Hypothyroid    Osteoporosis    UTI (lower urinary tract infection)     Social History   Socioeconomic History   Marital status: Married    Spouse name: Not on file   Number of children: Not on file   Years of education: Not on file   Highest education level: Not on file  Occupational History   Not on file  Tobacco Use   Smoking status: Never   Smokeless tobacco: Never  Vaping Use   Vaping Use: Never used  Substance and Sexual Activity   Alcohol use: Yes    Alcohol/week: 7.0 standard drinks    Types: 7 Glasses of wine per week    Comment: a glass of wine with dinner each night   Drug use: No   Sexual activity: Not on file  Other Topics Concern   Not on file  Social History Narrative   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ighter    Married for  11 years    Children who live in [place] Determinants of Health   Financial Resource Strain: Low  Risk    Difficulty of Paying Living Expenses: Not hard at all  Food Insecurity: No Food Insecurity   Worried About Charity fundraiser in the Last Year: Never true   Arboriculturist in the Last Year: Never true  Transportation Needs: No Transportation Needs   Lack of Transportation (Medical): No   Lack of Transportation (Non-Medical): No  Physical Activity: Sufficiently Active   Days of Exercise per Week: 5 days   Minutes of Exercise per Session: 50 min  Stress: No Stress Concern Present   Feeling of Stress : Not at all  Social Connections: Moderately Integrated   Frequency of Communication with Friends and Family: Three times a week   Frequency of Social Gatherings with Friends and Family: Three times a week   Attends Religious Services: Never   Active Member of Clubs or Organizations: Yes   Attends Music therapist: More than 4 times per year   Marital Status: Married  Human resources officer Violence: Not At Risk   Fear of Current or Ex-Partner: No   Emotionally Abused: No   Physically Abused: No   Sexually Abused: No    Past  Surgical History:  Procedure Laterality Date   CATARACT EXTRACTION Bilateral 2016   LAMINECTOMY     LUMBAR FUSION  2015   l4-L5   TUBAL LIGATION      Family History  Problem Relation Age of Onset   Osteoarthritis Mother    Osteoarthritis Maternal Grandmother    Osteoarthritis Father    Hypertension Father    Heart failure Father    Prostate cancer Paternal Grandfather        mets to colon   Arthritis Other    Prostate cancer Other    Mental illness Other     Allergies  Allergen Reactions   Augmentin [Amoxicillin-Pot Clavulanate] Diarrhea and Other (See Comments)    Severe Stomach cramps   Bactrim [Sulfamethoxazole-Trimethoprim] Swelling    Facial and lip swelling    Current Outpatient Medications on File Prior to Visit  Medication Sig Dispense Refill   azelastine (ASTELIN) 0.1 % nasal spray Place 1 spray into both nostrils 2  (two) times daily. Use in each nostril as directed     beclomethasone (QVAR) 40 MCG/ACT inhaler Inhale 2 puffs into the lungs 2 (two) times daily.     buPROPion (WELLBUTRIN XL) 300 MG 24 hr tablet Take 300 mg by mouth daily.      calcium-vitamin D (OSCAL-500) 500-400 MG-UNIT per tablet Take 1 tablet by mouth 2 (two) times daily with a meal.     cetirizine (ZYRTEC) 10 MG tablet Take 10 mg by mouth daily.      diphenhydrAMINE (BENADRYL) 25 MG tablet Take 25 mg by mouth every 6 (six) hours as needed for allergies.     docusate sodium (COLACE) 100 MG capsule Take 100 mg by mouth 2 (two) times daily as needed for mild constipation.      DULoxetine (CYMBALTA) 60 MG capsule Take 60 mg by mouth daily.      EPINEPHrine 0.3 mg/0.3 mL IJ SOAJ injection INJECT INTO THE MIDDLE OF THE OUTER THIGH AND HOLD FOR 3 SECONDS AS NEEDED FOR SEVERE ALLERGIC REACTION THEN CALL 911 IF USED.     fexofenadine (ALLEGRA) 180 MG tablet Take 180 mg by mouth daily.      fluticasone (FLONASE) 50 MCG/ACT nasal spray Place 1 spray into both nostrils 2 (two) times daily.     levothyroxine (SYNTHROID) 112 MCG tablet Take 1 tablet (112 mcg total) by mouth daily. 90 tablet 3   montelukast (SINGULAIR) 10 MG tablet Take 10 mg by mouth at bedtime.     Multiple Vitamin (MULTIVITAMIN WITH MINERALS) TABS Take 1 tablet by mouth daily.      pregabalin (LYRICA) 75 MG capsule Take 75 mg by mouth 2 (two) times daily.     No current facility-administered medications on file prior to visit.    BP 120/80    Pulse 70    Temp 97.8 F (36.6 C) (Oral)    Ht 5\' 3"  (1.6 m)    Wt 109 lb (49.4 kg)    SpO2 99%    BMI 19.31 kg/m       Objective:   Physical Exam Vitals and nursing note reviewed.  Constitutional:      Appearance: Normal appearance. She is ill-appearing.  Cardiovascular:     Rate and Rhythm: Normal rate and regular rhythm.     Pulses: Normal pulses.     Heart sounds: Normal heart sounds.  Pulmonary:     Effort: Pulmonary effort  is normal.     Breath sounds: Normal breath sounds.  Abdominal:     General: Abdomen is flat. Bowel sounds are normal.     Palpations: Abdomen is soft. There is no mass.     Tenderness: There is abdominal tenderness in the epigastric area and periumbilical area. There is no rebound. Negative signs include Murphy's sign, Rovsing's sign, McBurney's sign and psoas sign.     Hernia: No hernia is present.  Genitourinary:    Rectum: Guaiac result negative.  Musculoskeletal:        General: Normal range of motion.  Skin:    General: Skin is warm and dry.     Capillary Refill: Capillary refill takes less than 2 seconds.  Neurological:     General: No focal deficit present.     Mental Status: She is alert and oriented to person, place, and time.  Psychiatric:        Mood and Affect: Mood normal.        Behavior: Behavior normal. Behavior is cooperative.        Thought Content: Thought content normal.        Judgment: Judgment normal.      Assessment & Plan:  1. Epigastric pain - GERD vs PUD vs h.pylori. No concern for appendicitis at this time  - Hemoccult negative.  - Will check labs and start on protonix and carafate.  - Follow up if not improving in the next 2-3 days  - CBC with Differential/Platelet; Future - Basic Metabolic Panel; Future - H. pylori antibody, IgG; Future - sucralfate (CARAFATE) 1 g tablet; Take 1 tablet (1 g total) by mouth 4 (four) times daily -  with meals and at bedtime for 5 days.  Dispense: 20 tablet; Refill: 0 - pantoprazole (PROTONIX) 40 MG tablet; Take 1 tablet (40 mg total) by mouth daily.  Dispense: 30 tablet; Refill: 0  2. Nausea  - sucralfate (CARAFATE) 1 g tablet; Take 1 tablet (1 g total) by mouth 4 (four) times daily -  with meals and at bedtime for 5 days.  Dispense: 20 tablet; Refill: 0 - pantoprazole (PROTONIX) 40 MG tablet; Take 1 tablet (40 mg total) by mouth daily.  Dispense: 30 tablet; Refill: 0 - ondansetron (ZOFRAN) 4 MG tablet; Take 1  tablet (4 mg total) by mouth every 8 (eight) hours as needed for nausea or vomiting.  Dispense: 20 tablet; Refill: 0  3. Diarrhea, unspecified type  - sucralfate (CARAFATE) 1 g tablet; Take 1 tablet (1 g total) by mouth 4 (four) times daily -  with meals and at bedtime for 5 days.  Dispense: 20 tablet; Refill: 0 - pantoprazole (PROTONIX) 40 MG tablet; Take 1 tablet (40 mg total) by mouth daily.  Dispense: 30 tablet; Refill: 0   [name], NP  Time spent on chart review, time with patient; discussion of abdominal pain,  home monitoring,treatment, follow up plan, and documentation 30 minutes

## 2021-07-24 ENCOUNTER — Ambulatory Visit (INDEPENDENT_AMBULATORY_CARE_PROVIDER_SITE_OTHER): Payer: Medicare Other | Admitting: Adult Health

## 2021-07-24 ENCOUNTER — Encounter: Payer: Self-pay | Admitting: Adult Health

## 2021-07-24 VITALS — BP 98/60 | HR 59 | Temp 99.0°F | Ht 63.0 in | Wt 112.0 lb

## 2021-07-24 DIAGNOSIS — R1013 Epigastric pain: Secondary | ICD-10-CM | POA: Diagnosis not present

## 2021-07-24 NOTE — Progress Notes (Signed)
Subjective:    Patient ID: Jennifer Deleon, female    DOB: 1945-12-07, 76 y.o.   MRN: [id number]  HPI 76 year old female who  has a past medical history of Arthritis, Asthma, Chicken pox, Colon polyps, Depression, Environmental allergies, Hypothyroid, Osteoporosis, and UTI (lower urinary tract infection).  She presents to the office today for 1 week follow-up regarding abdominal pain, fatigue, and nausea.  When she was last seen she had concerns of blood in the stool as her stool has been very dark in color.  She was taking Pepto-Bismol at this time her stomach pain.  Her Hemoccult was negative.  On exam she appeared ill with abdominal tenderness in the epigastric and periumbilical area.  There is concern for GERD versus peptic ulcer disease versus H. pylori infection.  Her H. pylori was negative.  CBC and BMP were within normal limits.  She was started on Protonix 40 mg and Carafate.   Today she reports that she continues to feel weak, tired, nauseous and decreased appetite.  She continues to have abdominal pain but this has improved by about "50%".  She did have diarrhea after drinking coffee   Wt Readings from Last 3 Encounters:  07/24/21 112 lb (50.8 kg)  07/18/21 109 lb (49.4 kg)  04/24/21 115 lb (52.2 kg)   Review of Systems See HPI   Past Medical History:  Diagnosis Date   Arthritis    Asthma    Chicken pox    Colon polyps    Depression    Environmental allergies    allergies all year long   Hypothyroid    Osteoporosis    UTI (lower urinary tract infection)     Social History   Socioeconomic History   Marital status: Married    Spouse name: Not on file   Number of children: Not on file   Years of education: Not on file   Highest education level: Bachelor's degree (e.g., BA, AB, BS)  Occupational History   Not on file  Tobacco Use   Smoking status: Never   Smokeless tobacco: Never  Vaping Use   Vaping Use: Never used  Substance and Sexual Activity   Alcohol  use: Yes    Alcohol/week: 7.0 standard drinks    Types: 7 Glasses of wine per week    Comment: a glass of wine with dinner each night   Drug use: No   Sexual activity: Not on file  Other Topics Concern   Not on file  Social History Narrative   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ighter    Married for  11 years    Children who live in [place] Determinants of Health   Financial Resource Strain: Low Risk    Difficulty of Paying Living Expenses: Not hard at all  Food Insecurity: No Food Insecurity   Worried About Charity fundraiser in the Last Year: Never true   Arboriculturist in the Last Year: Never true  Transportation Needs: No Transportation Needs   Lack of Transportation (Medical): No   Lack of Transportation (Non-Medical): No  Physical Activity: Sufficiently Active   Days of Exercise per Week: 5 days   Minutes of Exercise per Session: 40 min  Stress: Stress Concern Present   Feeling of Stress : To some extent  Social Connections: Unknown   Frequency of Communication with Friends and Family: Twice a week   Frequency of Social Gatherings with Friends and Family: Twice  a week   Attends Religious Services: Patient refused   Active Member of Clubs or Organizations: Yes   Attends Music therapist: More than 4 times per year   Marital Status: Married  Human resources officer Violence: Not At Risk   Fear of Current or Ex-Partner: No   Emotionally Abused: No   Physically Abused: No   Sexually Abused: No    Past Surgical History:  Procedure Laterality Date   CATARACT EXTRACTION Bilateral 2016   LAMINECTOMY     LUMBAR FUSION  2015   l4-L5   TUBAL LIGATION      Family History  Problem Relation Age of Onset   Osteoarthritis Mother    Osteoarthritis Maternal Grandmother    Osteoarthritis Father    Hypertension Father    Heart failure Father    Prostate cancer Paternal Grandfather        mets to colon   Arthritis Other    Prostate cancer Other    Mental  illness Other     Allergies  Allergen Reactions   Augmentin [Amoxicillin-Pot Clavulanate] Diarrhea and Other (See Comments)    Severe Stomach cramps   Bactrim [Sulfamethoxazole-Trimethoprim] Swelling    Facial and lip swelling    Current Outpatient Medications on File Prior to Visit  Medication Sig Dispense Refill   azelastine (ASTELIN) 0.1 % nasal spray Place 1 spray into both nostrils 2 (two) times daily. Use in each nostril as directed     beclomethasone (QVAR) 40 MCG/ACT inhaler Inhale 2 puffs into the lungs 2 (two) times daily.     buPROPion (WELLBUTRIN XL) 300 MG 24 hr tablet Take 300 mg by mouth daily.      calcium-vitamin D (OSCAL-500) 500-400 MG-UNIT per tablet Take 1 tablet by mouth 2 (two) times daily with a meal.     cetirizine (ZYRTEC) 10 MG tablet Take 10 mg by mouth daily.      diphenhydrAMINE (BENADRYL) 25 MG tablet Take 25 mg by mouth every 6 (six) hours as needed for allergies.     docusate sodium (COLACE) 100 MG capsule Take 100 mg by mouth 2 (two) times daily as needed for mild constipation.      DULoxetine (CYMBALTA) 60 MG capsule Take 60 mg by mouth daily.      EPINEPHrine 0.3 mg/0.3 mL IJ SOAJ injection INJECT INTO THE MIDDLE OF THE OUTER THIGH AND HOLD FOR 3 SECONDS AS NEEDED FOR SEVERE ALLERGIC REACTION THEN CALL 911 IF USED.     fexofenadine (ALLEGRA) 180 MG tablet Take 180 mg by mouth daily.      fluticasone (FLONASE) 50 MCG/ACT nasal spray Place 1 spray into both nostrils 2 (two) times daily.     levothyroxine (SYNTHROID) 112 MCG tablet Take 1 tablet (112 mcg total) by mouth daily. 90 tablet 3   montelukast (SINGULAIR) 10 MG tablet Take 10 mg by mouth at bedtime.     Multiple Vitamin (MULTIVITAMIN WITH MINERALS) TABS Take 1 tablet by mouth daily.      ondansetron (ZOFRAN) 4 MG tablet Take 1 tablet (4 mg total) by mouth every 8 (eight) hours as needed for nausea or vomiting. 20 tablet 0   pantoprazole (PROTONIX) 40 MG tablet Take 1 tablet (40 mg total) by mouth  daily. 30 tablet 0   pregabalin (LYRICA) 75 MG capsule Take 75 mg by mouth 2 (two) times daily.     sucralfate (CARAFATE) 1 g tablet Take 1 tablet (1 g total) by mouth 4 (four) times daily -  with meals and at bedtime for 5 days. 20 tablet 0   No current facility-administered medications on file prior to visit.    BP 98/60    Pulse (!) 59    Temp 99 F (37.2 C) (Oral)    Ht 5\' 3"  (1.6 m)    Wt 112 lb (50.8 kg)    SpO2 98%    BMI 19.84 kg/m       Objective:   Physical Exam Vitals and nursing note reviewed.  Constitutional:      Appearance: Normal appearance.  Abdominal:     General: Abdomen is flat.     Palpations: Abdomen is soft.     Tenderness: There is abdominal tenderness.  Musculoskeletal:        General: Normal range of motion.  Skin:    General: Skin is warm and dry.     Capillary Refill: Capillary refill takes less than 2 seconds.  Neurological:     General: No focal deficit present.     Mental Status: She is alert and oriented to person, place, and time.  Psychiatric:        Mood and Affect: Mood normal.        Behavior: Behavior normal.        Thought Content: Thought content normal.        Judgment: Judgment normal.      Assessment & Plan:  1. Epigastric pain -She does look better today than she did last week.  Continued concern for ulcer.  Still tender with palpation to epigastric area.  Advised to continue Protonix.  Can take Zofran for nausea.  Symptoms should continue to improve the longer she is on Protonix.  Encouraged staying away from food and drink that is acidic, she wants a bland diet.  Can have Ensure clear to help with protein intake.  Will refer to gastroenterology for further evaluation - Ambulatory referral to Gastroenterology   [name], NP  Time spent on chart review, time with patient; discussion of abdominal pain, treatment, follow up plan, and documentation 30 minutes

## 2021-07-25 ENCOUNTER — Encounter: Payer: Self-pay | Admitting: Physician Assistant

## 2021-08-08 DIAGNOSIS — J3089 Other allergic rhinitis: Secondary | ICD-10-CM | POA: Diagnosis not present

## 2021-08-08 DIAGNOSIS — J3081 Allergic rhinitis due to animal (cat) (dog) hair and dander: Secondary | ICD-10-CM | POA: Diagnosis not present

## 2021-08-08 DIAGNOSIS — J301 Allergic rhinitis due to pollen: Secondary | ICD-10-CM | POA: Diagnosis not present

## 2021-08-12 ENCOUNTER — Other Ambulatory Visit: Payer: Self-pay

## 2021-08-12 ENCOUNTER — Telehealth (INDEPENDENT_AMBULATORY_CARE_PROVIDER_SITE_OTHER): Payer: Medicare Other | Admitting: Family Medicine

## 2021-08-12 ENCOUNTER — Ambulatory Visit: Payer: Medicare Other | Admitting: Physician Assistant

## 2021-08-12 DIAGNOSIS — U071 COVID-19: Secondary | ICD-10-CM | POA: Diagnosis not present

## 2021-08-12 MED ORDER — MOLNUPIRAVIR EUA 200MG CAPSULE: 4.0000 | ORAL_CAPSULE | Freq: Two times a day (BID) | ORAL | 0 refills | Status: AC

## 2021-08-12 NOTE — Progress Notes (Signed)
Patient ID: Jennifer Deleon, female   DOB: 1946-02-16, 76 y.o.   MRN: [id number]   This visit type was conducted due to national recommendations for restrictions regarding the COVID-19 pandemic in an effort to limit this patient's exposure and mitigate transmission in our community.   Virtual Visit via Telephone Note  I connected with Jennifer Deleon on 08/12/21 at  2:15 PM EST by telephone and verified that I am speaking with the correct person using two identifiers.   I discussed the limitations, risks, security and privacy concerns of performing an evaluation and management service by telephone and the availability of in person appointments. I also discussed with the patient that there may be a patient responsible charge related to this service. The patient expressed understanding and agreed to proceed.  Location patient: home Location provider: work or home office Participants present for the call: patient, provider Patient did not have a visit in the prior 7 days to address this/these issue(s).   History of Present Illness: Jennifer Deleon has COVID by home testing.  She states that during the night Friday she developed some symptoms of cough and congestion and headache and fatigue.  She did home COVID test both yesterday and today both positive.  She sings in a senior chorus and apparently was around someone last week who subsequently tested positive.  She denies any nausea or vomiting.  No dyspnea.  Major symptoms are fatigue and body aches and some cough.  Her chronic problems include hypothyroidism.  No chronic lung or heart problems.  Past Medical History:  Diagnosis Date   Arthritis    Asthma    Chicken pox    Colon polyps    Depression    Environmental allergies    allergies all year long   Hypothyroid    Osteoporosis    UTI (lower urinary tract infection)    Past Surgical History:  Procedure Laterality Date   CATARACT EXTRACTION Bilateral 2016   LAMINECTOMY     LUMBAR FUSION   2015   l4-L5   TUBAL LIGATION      reports that she has never smoked. She has never used smokeless tobacco. She reports current alcohol use of about 7.0 standard drinks per week. She reports that she does not use drugs. family history includes Arthritis in an other family member; Heart failure in her father; Hypertension in her father; Mental illness in an other family member; Osteoarthritis in her father, maternal grandmother, and mother; Prostate cancer in her paternal grandfather and another family member. Allergies  Allergen Reactions   Augmentin [Amoxicillin-Pot Clavulanate] Diarrhea and Other (See Comments)    Severe Stomach cramps   Bactrim [Sulfamethoxazole-Trimethoprim] Swelling    Facial and lip swelling      Observations/Objective: Patient sounds cheerful and well on the phone. I do not appreciate any SOB. Speech and thought processing are grossly intact. Patient reported vitals:  Assessment and Plan:  COVID-19 infection.  -We discussed isolation precautions -Gave her option of starting molnupiravir-4 capsules by mouth twice daily for 5 days and she would like to do so - plenty of fluids and rest.   -Follow-up promptly or go to ER for any dyspnea or worsening symptoms  Follow Up Instructions:   99441 5-10 99442 11-20 99443 21-30 I did not refer this patient for an OV in the next 24 hours for this/these issue(s).  I discussed the assessment and treatment plan with the patient. The patient was provided an opportunity to ask questions and all  were answered. The patient agreed with the plan and demonstrated an understanding of the instructions.   The patient was advised to call back or seek an in-person evaluation if the symptoms worsen or if the condition fails to improve as anticipated.  I provided 15 minutes of non-face-to-face time during this encounter.   Carolann Littler, MD

## 2021-08-22 ENCOUNTER — Other Ambulatory Visit: Payer: Medicare Other

## 2021-08-22 DIAGNOSIS — J3089 Other allergic rhinitis: Secondary | ICD-10-CM | POA: Diagnosis not present

## 2021-08-22 DIAGNOSIS — J301 Allergic rhinitis due to pollen: Secondary | ICD-10-CM | POA: Diagnosis not present

## 2021-08-22 DIAGNOSIS — J3081 Allergic rhinitis due to animal (cat) (dog) hair and dander: Secondary | ICD-10-CM | POA: Diagnosis not present

## 2021-09-06 DIAGNOSIS — J301 Allergic rhinitis due to pollen: Secondary | ICD-10-CM | POA: Diagnosis not present

## 2021-09-06 DIAGNOSIS — J3089 Other allergic rhinitis: Secondary | ICD-10-CM | POA: Diagnosis not present

## 2021-09-06 DIAGNOSIS — J3081 Allergic rhinitis due to animal (cat) (dog) hair and dander: Secondary | ICD-10-CM | POA: Diagnosis not present

## 2021-09-10 DIAGNOSIS — M47816 Spondylosis without myelopathy or radiculopathy, lumbar region: Secondary | ICD-10-CM | POA: Diagnosis not present

## 2021-09-10 DIAGNOSIS — S335XXA Sprain of ligaments of lumbar spine, initial encounter: Secondary | ICD-10-CM | POA: Diagnosis not present

## 2021-09-15 ENCOUNTER — Emergency Department (HOSPITAL_COMMUNITY): Payer: Medicare Other

## 2021-09-15 ENCOUNTER — Emergency Department (HOSPITAL_COMMUNITY)
Admission: EM | Admit: 2021-09-15 | Discharge: 2021-09-15 | Disposition: A | Discharge status: Home / Self Care | Payer: Medicare Other | Attending: Emergency Medicine | Admitting: Emergency Medicine

## 2021-09-15 ENCOUNTER — Encounter (HOSPITAL_COMMUNITY): Payer: Self-pay

## 2021-09-15 ENCOUNTER — Other Ambulatory Visit: Payer: Self-pay

## 2021-09-15 DIAGNOSIS — E039 Hypothyroidism, unspecified: Secondary | ICD-10-CM | POA: Diagnosis not present

## 2021-09-15 DIAGNOSIS — Z9889 Other specified postprocedural states: Secondary | ICD-10-CM | POA: Diagnosis not present

## 2021-09-15 DIAGNOSIS — J45909 Unspecified asthma, uncomplicated: Secondary | ICD-10-CM | POA: Diagnosis not present

## 2021-09-15 DIAGNOSIS — S7002XA Contusion of left hip, initial encounter: Secondary | ICD-10-CM | POA: Insufficient documentation

## 2021-09-15 DIAGNOSIS — Z79899 Other long term (current) drug therapy: Secondary | ICD-10-CM | POA: Insufficient documentation

## 2021-09-15 DIAGNOSIS — W109XXA Fall (on) (from) unspecified stairs and steps, initial encounter: Secondary | ICD-10-CM | POA: Insufficient documentation

## 2021-09-15 DIAGNOSIS — Y9301 Activity, walking, marching and hiking: Secondary | ICD-10-CM | POA: Insufficient documentation

## 2021-09-15 DIAGNOSIS — Z7951 Long term (current) use of inhaled steroids: Secondary | ICD-10-CM | POA: Diagnosis not present

## 2021-09-15 DIAGNOSIS — W19XXXA Unspecified fall, initial encounter: Secondary | ICD-10-CM

## 2021-09-15 DIAGNOSIS — S79912A Unspecified injury of left hip, initial encounter: Secondary | ICD-10-CM | POA: Diagnosis not present

## 2021-09-15 MED ORDER — DICLOFENAC SODIUM 1 % EX GEL: 4.0000 g | Freq: Four times a day (QID) | CUTANEOUS | 1 refills | Status: DC

## 2021-09-15 MED ORDER — OXYCODONE-ACETAMINOPHEN 5-325 MG PO TABS
1.0000 | ORAL_TABLET | Freq: Once | ORAL | Status: AC
  Administered 2021-09-15: 1 via ORAL
  Filled 2021-09-15: qty 1

## 2021-09-15 NOTE — ED Notes (Signed)
Pt reports pain feels like it is in L pelvis/hip area.  Sts she was prescribed a course of prednsione, but it has not helped.  Sts she has take OTC pain medication and "leftover pain medicine w/o relief." ?

## 2021-09-15 NOTE — ED Provider Notes (Incomplete)
[facility] DEPT Provider Note   CSN: [id number] Arrival date & time: 09/15/21  1146     History {Add pertinent medical, surgical, social history, OB history to HPI:1} Chief Complaint  Patient presents with   Jennifer Deleon is a 76 y.o. female.   Fall        Home Medications Prior to Admission medications   Medication Sig Start Date End Date Taking? Authorizing Provider  azelastine (ASTELIN) 0.1 % nasal spray Place 1 spray into both nostrils 2 (two) times daily. Use in each nostril as directed    Tiajuana Amass, MD  beclomethasone (QVAR) 40 MCG/ACT inhaler Inhale 2 puffs into the lungs 2 (two) times daily.    [provider]  buPROPion (WELLBUTRIN XL) 300 MG 24 hr tablet Take 300 mg by mouth daily.     [provider]  calcium-vitamin D (OSCAL-500) 500-400 MG-UNIT per tablet Take 1 tablet by mouth 2 (two) times daily with a meal.    [provider]  cetirizine (ZYRTEC) 10 MG tablet Take 10 mg by mouth daily.     [provider]  diphenhydrAMINE (BENADRYL) 25 MG tablet Take 25 mg by mouth every 6 (six) hours as needed for allergies.    [provider]  docusate sodium (COLACE) 100 MG capsule Take 100 mg by mouth 2 (two) times daily as needed for mild constipation.     [provider]  DULoxetine (CYMBALTA) 60 MG capsule Take 60 mg by mouth daily.  12/23/16   [provider]  EPINEPHrine 0.3 mg/0.3 mL IJ SOAJ injection INJECT INTO THE MIDDLE OF THE OUTER THIGH AND HOLD FOR 3 SECONDS AS NEEDED FOR SEVERE ALLERGIC REACTION THEN CALL 911 IF USED.    [provider]  fexofenadine (ALLEGRA) 180 MG tablet Take 180 mg by mouth daily.     [provider]  fluticasone (FLONASE) 50 MCG/ACT nasal spray Place 1 spray into both nostrils 2 (two) times daily. 12/23/16   [provider]  levothyroxine (SYNTHROID) 112 MCG tablet Take 1 tablet (112 mcg total) by mouth daily.  04/24/21   Nafziger, Tommi Rumps, NP  montelukast (SINGULAIR) 10 MG tablet Take 10 mg by mouth at bedtime.    [provider]  Multiple Vitamin (MULTIVITAMIN WITH MINERALS) TABS Take 1 tablet by mouth daily.     [provider]  ondansetron (ZOFRAN) 4 MG tablet Take 1 tablet (4 mg total) by mouth every 8 (eight) hours as needed for nausea or vomiting. 07/18/21   Nafziger, Tommi Rumps, NP  pantoprazole (PROTONIX) 40 MG tablet Take 1 tablet (40 mg total) by mouth daily. 07/18/21   Nafziger, Tommi Rumps, NP  pregabalin (LYRICA) 75 MG capsule Take 75 mg by mouth 2 (two) times daily. 03/18/19   [provider]      Allergies    Augmentin [amoxicillin-pot clavulanate] and Bactrim [sulfamethoxazole-trimethoprim]    Review of Systems   Review of Systems  Physical Exam Updated Vital Signs BP 134/83 (BP Location: Left Arm)    Pulse 64    Temp 97.9 F (36.6 C) (Oral)    Resp 16    SpO2 99%  Physical Exam Vitals and nursing note reviewed.  Constitutional:      General: She is not in acute distress. HENT:     Head: Normocephalic and atraumatic.     Nose: Nose normal.  Eyes:     General: No scleral icterus. Cardiovascular:     Rate and  Rhythm: Normal rate and regular rhythm.     Pulses: Normal pulses.     Heart sounds: Normal heart sounds.  Pulmonary:     Effort: Pulmonary effort is normal. No respiratory distress.     Breath sounds: No wheezing.  Abdominal:     Palpations: Abdomen is soft.     Tenderness: There is no abdominal tenderness.  Musculoskeletal:     Cervical back: Normal range of motion.     Right lower leg: No edema.     Left lower leg: No edema.  Skin:    General: Skin is warm and dry.     Capillary Refill: Capillary refill takes less than 2 seconds.  Neurological:     Mental Status: She is alert. Mental status is at baseline.  Psychiatric:        Mood and Affect: Mood normal.        Behavior: Behavior normal.    ED Results / Procedures / Treatments   Labs (all  labs ordered are listed, but only abnormal results are displayed) Labs Reviewed - No data to display  EKG None  Radiology No results found.  Procedures Procedures  {Document cardiac monitor, telemetry assessment procedure when appropriate:1}  Medications Ordered in ED Medications - No data to display  ED Course/ Medical Decision Making/ A&P                           Medical Decision Making Amount and/or Complexity of Data Reviewed Radiology: ordered.  Risk Prescription drug management.   ***  {Document critical care time when appropriate:1} {Document review of labs and clinical decision tools ie heart score, Chads2Vasc2 etc:1}  {Document your independent review of radiology images, and any outside records:1} {Document your discussion with family members, caretakers, and with consultants:1} {Document social determinants of health affecting pt's care:1} {Document your decision making why or why not admission, treatments were needed:1} Final Clinical Impression(s) / ED Diagnoses Final diagnoses:  None    Rx / DC Orders ED Discharge Orders     None

## 2021-09-15 NOTE — ED Notes (Signed)
ED Provider at bedside. 

## 2021-09-15 NOTE — ED Triage Notes (Signed)
Pt reports tripping on a step and falling on Monday. Pt endorses low back pain. Pt reports going to spine doctor and having an X-ray done the day after that did not show any injury. Denies hitting her head, LOC, and does not take blood thinners.  ?

## 2021-09-15 NOTE — ED Notes (Signed)
Patient transported to CT 

## 2021-09-15 NOTE — ED Notes (Signed)
Delay in d/c due to following up w/ EDP about Voltaren prescription.  ?

## 2021-09-15 NOTE — Discharge Instructions (Addendum)
The CT scan of your hip was without any fractures.  As we discussed this appears to be a soft tissue injury.  Use Voltaren gel as prescribed, take Tylenol 1000 mg every 6 hours for pain.  Drink plenty of water gentle stretching and gentle exercise and to talk to your primary care doctor and spine doctor about additional pain medicine which could include increasing doses of pregabalin that you have been prescribed in the past depending on what dosage you are currently on. ?

## 2021-09-17 ENCOUNTER — Other Ambulatory Visit: Payer: Self-pay | Admitting: Rehabilitation

## 2021-09-17 ENCOUNTER — Other Ambulatory Visit: Payer: Self-pay | Admitting: Internal Medicine

## 2021-09-17 DIAGNOSIS — M549 Dorsalgia, unspecified: Secondary | ICD-10-CM | POA: Diagnosis not present

## 2021-09-17 DIAGNOSIS — S32010A Wedge compression fracture of first lumbar vertebra, initial encounter for closed fracture: Secondary | ICD-10-CM

## 2021-09-17 DIAGNOSIS — Z681 Body mass index (BMI) 19 or less, adult: Secondary | ICD-10-CM | POA: Diagnosis not present

## 2021-09-18 ENCOUNTER — Other Ambulatory Visit: Payer: Self-pay

## 2021-09-18 ENCOUNTER — Ambulatory Visit
Admission: RE | Admit: 2021-09-18 | Discharge: 2021-09-18 | Disposition: A | Discharge status: Home / Self Care | Payer: Medicare Other | Source: Ambulatory Visit | Attending: Rehabilitation | Admitting: Rehabilitation

## 2021-09-18 DIAGNOSIS — S32010A Wedge compression fracture of first lumbar vertebra, initial encounter for closed fracture: Secondary | ICD-10-CM

## 2021-09-18 DIAGNOSIS — M4126 Other idiopathic scoliosis, lumbar region: Secondary | ICD-10-CM | POA: Diagnosis not present

## 2021-09-18 DIAGNOSIS — M48061 Spinal stenosis, lumbar region without neurogenic claudication: Secondary | ICD-10-CM | POA: Diagnosis not present

## 2021-09-18 DIAGNOSIS — M545 Low back pain, unspecified: Secondary | ICD-10-CM | POA: Diagnosis not present

## 2021-09-19 DIAGNOSIS — M8008XA Age-related osteoporosis with current pathological fracture, vertebra(e), initial encounter for fracture: Secondary | ICD-10-CM | POA: Diagnosis not present

## 2021-09-23 DIAGNOSIS — H52203 Unspecified astigmatism, bilateral: Secondary | ICD-10-CM | POA: Diagnosis not present

## 2021-09-23 DIAGNOSIS — Z961 Presence of intraocular lens: Secondary | ICD-10-CM | POA: Diagnosis not present

## 2021-09-24 DIAGNOSIS — J3081 Allergic rhinitis due to animal (cat) (dog) hair and dander: Secondary | ICD-10-CM | POA: Diagnosis not present

## 2021-09-24 DIAGNOSIS — J301 Allergic rhinitis due to pollen: Secondary | ICD-10-CM | POA: Diagnosis not present

## 2021-09-24 DIAGNOSIS — J3089 Other allergic rhinitis: Secondary | ICD-10-CM | POA: Diagnosis not present

## 2021-09-25 DIAGNOSIS — J3089 Other allergic rhinitis: Secondary | ICD-10-CM | POA: Diagnosis not present

## 2021-09-25 DIAGNOSIS — J301 Allergic rhinitis due to pollen: Secondary | ICD-10-CM | POA: Diagnosis not present

## 2021-09-25 DIAGNOSIS — J3081 Allergic rhinitis due to animal (cat) (dog) hair and dander: Secondary | ICD-10-CM | POA: Diagnosis not present

## 2021-10-01 DIAGNOSIS — S32010S Wedge compression fracture of first lumbar vertebra, sequela: Secondary | ICD-10-CM | POA: Diagnosis not present

## 2021-10-01 DIAGNOSIS — Z681 Body mass index (BMI) 19 or less, adult: Secondary | ICD-10-CM | POA: Diagnosis not present

## 2021-10-01 DIAGNOSIS — M4326 Fusion of spine, lumbar region: Secondary | ICD-10-CM | POA: Diagnosis not present

## 2021-10-01 DIAGNOSIS — M545 Low back pain, unspecified: Secondary | ICD-10-CM | POA: Diagnosis not present

## 2021-10-01 DIAGNOSIS — M549 Dorsalgia, unspecified: Secondary | ICD-10-CM | POA: Diagnosis not present

## 2021-10-18 DIAGNOSIS — J301 Allergic rhinitis due to pollen: Secondary | ICD-10-CM | POA: Diagnosis not present

## 2021-10-18 DIAGNOSIS — J3089 Other allergic rhinitis: Secondary | ICD-10-CM | POA: Diagnosis not present

## 2021-10-18 DIAGNOSIS — J3081 Allergic rhinitis due to animal (cat) (dog) hair and dander: Secondary | ICD-10-CM | POA: Diagnosis not present

## 2021-10-24 DIAGNOSIS — M8008XD Age-related osteoporosis with current pathological fracture, vertebra(e), subsequent encounter for fracture with routine healing: Secondary | ICD-10-CM | POA: Diagnosis not present

## 2021-10-24 DIAGNOSIS — M4726 Other spondylosis with radiculopathy, lumbar region: Secondary | ICD-10-CM | POA: Diagnosis not present

## 2021-10-24 DIAGNOSIS — M5431 Sciatica, right side: Secondary | ICD-10-CM | POA: Diagnosis not present

## 2021-10-29 DIAGNOSIS — M5416 Radiculopathy, lumbar region: Secondary | ICD-10-CM | POA: Diagnosis not present

## 2021-10-30 DIAGNOSIS — Z20822 Contact with and (suspected) exposure to covid-19: Secondary | ICD-10-CM | POA: Diagnosis not present

## 2021-11-04 DIAGNOSIS — J3081 Allergic rhinitis due to animal (cat) (dog) hair and dander: Secondary | ICD-10-CM | POA: Diagnosis not present

## 2021-11-04 DIAGNOSIS — J301 Allergic rhinitis due to pollen: Secondary | ICD-10-CM | POA: Diagnosis not present

## 2021-11-04 DIAGNOSIS — J3089 Other allergic rhinitis: Secondary | ICD-10-CM | POA: Diagnosis not present

## 2021-11-11 DIAGNOSIS — Z20822 Contact with and (suspected) exposure to covid-19: Secondary | ICD-10-CM | POA: Diagnosis not present

## 2021-11-19 DIAGNOSIS — M5416 Radiculopathy, lumbar region: Secondary | ICD-10-CM | POA: Diagnosis not present

## 2021-11-19 DIAGNOSIS — M4326 Fusion of spine, lumbar region: Secondary | ICD-10-CM | POA: Diagnosis not present

## 2021-11-27 DIAGNOSIS — J301 Allergic rhinitis due to pollen: Secondary | ICD-10-CM | POA: Diagnosis not present

## 2021-11-27 DIAGNOSIS — J3081 Allergic rhinitis due to animal (cat) (dog) hair and dander: Secondary | ICD-10-CM | POA: Diagnosis not present

## 2021-11-27 DIAGNOSIS — J3089 Other allergic rhinitis: Secondary | ICD-10-CM | POA: Diagnosis not present

## 2021-12-13 ENCOUNTER — Ambulatory Visit (INDEPENDENT_AMBULATORY_CARE_PROVIDER_SITE_OTHER): Payer: Medicare Other | Admitting: Adult Health

## 2021-12-13 ENCOUNTER — Encounter: Payer: Self-pay | Admitting: Adult Health

## 2021-12-13 VITALS — BP 110/80 | HR 73 | Temp 98.6°F | Ht 63.0 in | Wt 116.0 lb

## 2021-12-13 DIAGNOSIS — M7050 Other bursitis of knee, unspecified knee: Secondary | ICD-10-CM

## 2021-12-13 DIAGNOSIS — M25561 Pain in right knee: Secondary | ICD-10-CM | POA: Diagnosis not present

## 2021-12-13 MED ORDER — DICLOFENAC SODIUM 75 MG PO TBEC: 75.0000 mg | DELAYED_RELEASE_TABLET | Freq: Two times a day (BID) | ORAL | 0 refills | Status: DC

## 2021-12-13 NOTE — Progress Notes (Signed)
Subjective:    Patient ID: Jennifer Deleon, female    DOB: [birth year], 76 y.o.   MRN: [id number]  HPI 76 year old female who  has a past medical history of Arthritis, Asthma, Chicken pox, Colon polyps, Depression, Environmental allergies, Hypothyroid, Osteoporosis, and UTI (lower urinary tract infection).  She presents to the office today for an acute issue of right knee pain   She reports that two weeks ago she twisted her knee getting up off the ground when she was gardening. When she was getting up she felt a " twinge on the inside area of her right knee. She has injured this knee in the past back in 2019.   She has been using Voltaren gel and OTC Nsaids without relief. She has also been icing it.   Pain is worse when she walks. Has not had any swelling or redness   Review of Systems See HPI   Past Medical History:  Diagnosis Date   Arthritis    Asthma    Chicken pox    Colon polyps    Depression    Environmental allergies    allergies all year long   Hypothyroid    Osteoporosis    UTI (lower urinary tract infection)     Social History   Socioeconomic History   Marital status: Married    Spouse name: Not on file   Number of children: Not on file   Years of education: Not on file   Highest education level: Bachelor's degree (e.g., BA, AB, BS)  Occupational History   Not on file  Tobacco Use   Smoking status: Never   Smokeless tobacco: Never  Vaping Use   Vaping Use: Never used  Substance and Sexual Activity   Alcohol use: Yes    Alcohol/week: 7.0 standard drinks of alcohol    Types: 7 Glasses of wine per week    Comment: a glass of wine with dinner each night   Drug use: No   Sexual activity: Not on file  Other Topics Concern   Not on file  Social History Narrative   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    Married for  11 years    Children who live in [place] Strain: Low Risk  (03/05/2021)   Overall  Financial Resource Strain (CARDIA)    Difficulty of Paying Living Expenses: Not hard at all  Food Insecurity: No Food Insecurity (07/23/2021)   Hunger Vital Sign    Worried About Running Out of Food in the Last Year: Never true    Garner in the Last Year: Never true  Transportation Needs: No Transportation Needs (07/23/2021)   PRAPARE - Hydrologist (Medical): No    Lack of Transportation (Non-Medical): No  Physical Activity: Sufficiently Active (07/23/2021)   Exercise Vital Sign    Days of Exercise per Week: 5 days    Minutes of Exercise per Session: 40 min  Stress: Stress Concern Present (07/23/2021)   Hazard    Feeling of Stress : To some extent  Social Connections: Unknown (07/23/2021)   Social Connection and Isolation Panel [NHANES]    Frequency of Communication with Friends and Family: Twice a week    Frequency of Social Gatherings with Friends and Family: Twice a week    Attends Religious Services: Patient refused    Active Member  of Clubs or Organizations: Yes    Attends Archivist Meetings: More than 4 times per year    Marital Status: Married  Human resources officer Violence: Not At Risk (03/05/2021)   Humiliation, Afraid, Rape, and Kick questionnaire    Fear of Current or Ex-Partner: No    Emotionally Abused: No    Physically Abused: No    Sexually Abused: No    Past Surgical History:  Procedure Laterality Date   CATARACT EXTRACTION Bilateral 2016   LAMINECTOMY     LUMBAR FUSION  2015   l4-L5   TUBAL LIGATION      Family History  Problem Relation Age of Onset   Osteoarthritis Mother    Osteoarthritis Maternal Grandmother    Osteoarthritis Father    Hypertension Father    Heart failure Father    Prostate cancer Paternal Grandfather        mets to colon   Arthritis Other    Prostate cancer Other    Mental illness Other     Allergies  Allergen  Reactions   Augmentin [Amoxicillin-Pot Clavulanate] Diarrhea and Other (See Comments)    Severe Stomach cramps   Bactrim [Sulfamethoxazole-Trimethoprim] Swelling    Facial and lip swelling    Current Outpatient Medications on File Prior to Visit  Medication Sig Dispense Refill   azelastine (ASTELIN) 0.1 % nasal spray Place 1 spray into both nostrils 2 (two) times daily. Use in each nostril as directed     buPROPion (WELLBUTRIN XL) 300 MG 24 hr tablet Take 300 mg by mouth daily.      calcium-vitamin D (OSCAL-500) 500-400 MG-UNIT per tablet Take 1 tablet by mouth 2 (two) times daily with a meal.     cetirizine (ZYRTEC) 10 MG tablet Take 10 mg by mouth daily.      diclofenac Sodium (VOLTAREN) 1 % GEL Apply 4 g topically 4 (four) times daily. 100 g 1   diphenhydrAMINE (BENADRYL) 25 MG tablet Take 25 mg by mouth every 6 (six) hours as needed for allergies.     docusate sodium (COLACE) 100 MG capsule Take 100 mg by mouth 2 (two) times daily as needed for mild constipation.      DULoxetine (CYMBALTA) 60 MG capsule Take 60 mg by mouth daily.      EPINEPHrine 0.3 mg/0.3 mL IJ SOAJ injection INJECT INTO THE MIDDLE OF THE OUTER THIGH AND HOLD FOR 3 SECONDS AS NEEDED FOR SEVERE ALLERGIC REACTION THEN CALL 911 IF USED.     fexofenadine (ALLEGRA) 180 MG tablet Take 180 mg by mouth daily.      fluticasone (FLONASE) 50 MCG/ACT nasal spray Place 1 spray into both nostrils 2 (two) times daily.     levothyroxine (SYNTHROID) 112 MCG tablet Take 1 tablet (112 mcg total) by mouth daily. 90 tablet 3   montelukast (SINGULAIR) 10 MG tablet Take 10 mg by mouth at bedtime.     Multiple Vitamin (MULTIVITAMIN WITH MINERALS) TABS Take 1 tablet by mouth daily.      pregabalin (LYRICA) 75 MG capsule Take 75 mg by mouth 2 (two) times daily.     No current facility-administered medications on file prior to visit.    BP 110/80   Pulse 73   Temp 98.6 F (37 C) (Oral)   Ht '5\' 3"'$  (1.6 m)   Wt 116 lb (52.6 kg)   SpO2 95%    BMI 20.55 kg/m       Objective:   Physical Exam Vitals and nursing  note reviewed.  Constitutional:      Appearance: Normal appearance.  Musculoskeletal:        General: Tenderness present. No swelling or deformity. Normal range of motion.     Right knee: No bony tenderness or crepitus. Normal range of motion. Tenderness present over the MCL. No medial joint line tenderness. No LCL laxity, MCL laxity, ACL laxity or PCL laxity. Normal alignment, normal meniscus and normal patellar mobility. Normal pulse.     Instability Tests: Anterior drawer test negative. Posterior drawer test negative. Anterior Lachman test negative. Medial McMurray test negative and lateral McMurray test negative.     Comments: Tenderness over Pes anserine   Skin:    General: Skin is warm and dry.  Neurological:     Mental Status: She is alert.       Assessment & Plan:  1. Acute pain of right knee - Advised conservative measures such as ice, rest, compression, Nsaids.  - May take upwards of 6 weeks to resolve  - Follow up as needed - diclofenac (VOLTAREN) 75 MG EC tablet; Take 1 tablet (75 mg total) by mouth 2 (two) times daily.  Dispense: 30 tablet; Refill: 0  2. Pes anserine bursitis  - diclofenac (VOLTAREN) 75 MG EC tablet; Take 1 tablet (75 mg total) by mouth 2 (two) times daily.  Dispense: 30 tablet; Refill: 0  [name], NP

## 2021-12-16 DIAGNOSIS — M5416 Radiculopathy, lumbar region: Secondary | ICD-10-CM | POA: Diagnosis not present

## 2021-12-19 DIAGNOSIS — M4326 Fusion of spine, lumbar region: Secondary | ICD-10-CM | POA: Diagnosis not present

## 2021-12-19 DIAGNOSIS — M5416 Radiculopathy, lumbar region: Secondary | ICD-10-CM | POA: Diagnosis not present

## 2021-12-19 DIAGNOSIS — M545 Low back pain, unspecified: Secondary | ICD-10-CM | POA: Diagnosis not present

## 2021-12-20 DIAGNOSIS — J3081 Allergic rhinitis due to animal (cat) (dog) hair and dander: Secondary | ICD-10-CM | POA: Diagnosis not present

## 2021-12-20 DIAGNOSIS — J3089 Other allergic rhinitis: Secondary | ICD-10-CM | POA: Diagnosis not present

## 2021-12-20 DIAGNOSIS — J301 Allergic rhinitis due to pollen: Secondary | ICD-10-CM | POA: Diagnosis not present

## 2021-12-24 DIAGNOSIS — J3089 Other allergic rhinitis: Secondary | ICD-10-CM | POA: Diagnosis not present

## 2021-12-24 DIAGNOSIS — J3081 Allergic rhinitis due to animal (cat) (dog) hair and dander: Secondary | ICD-10-CM | POA: Diagnosis not present

## 2021-12-24 DIAGNOSIS — J301 Allergic rhinitis due to pollen: Secondary | ICD-10-CM | POA: Diagnosis not present

## 2021-12-25 ENCOUNTER — Other Ambulatory Visit: Payer: Self-pay | Admitting: Student in an Organized Health Care Education/Training Program

## 2021-12-25 DIAGNOSIS — M5416 Radiculopathy, lumbar region: Secondary | ICD-10-CM

## 2021-12-25 DIAGNOSIS — M4326 Fusion of spine, lumbar region: Secondary | ICD-10-CM

## 2021-12-26 DIAGNOSIS — J3081 Allergic rhinitis due to animal (cat) (dog) hair and dander: Secondary | ICD-10-CM | POA: Diagnosis not present

## 2021-12-26 DIAGNOSIS — J3089 Other allergic rhinitis: Secondary | ICD-10-CM | POA: Diagnosis not present

## 2021-12-26 DIAGNOSIS — J301 Allergic rhinitis due to pollen: Secondary | ICD-10-CM | POA: Diagnosis not present

## 2022-01-01 DIAGNOSIS — J301 Allergic rhinitis due to pollen: Secondary | ICD-10-CM | POA: Diagnosis not present

## 2022-01-01 DIAGNOSIS — J3089 Other allergic rhinitis: Secondary | ICD-10-CM | POA: Diagnosis not present

## 2022-01-01 DIAGNOSIS — J3081 Allergic rhinitis due to animal (cat) (dog) hair and dander: Secondary | ICD-10-CM | POA: Diagnosis not present

## 2022-01-03 DIAGNOSIS — J3081 Allergic rhinitis due to animal (cat) (dog) hair and dander: Secondary | ICD-10-CM | POA: Diagnosis not present

## 2022-01-03 DIAGNOSIS — J301 Allergic rhinitis due to pollen: Secondary | ICD-10-CM | POA: Diagnosis not present

## 2022-01-03 DIAGNOSIS — J3089 Other allergic rhinitis: Secondary | ICD-10-CM | POA: Diagnosis not present

## 2022-01-10 ENCOUNTER — Ambulatory Visit
Admission: RE | Admit: 2022-01-10 | Discharge: 2022-01-10 | Disposition: A | Discharge status: Home / Self Care | Payer: Medicare Other | Source: Ambulatory Visit | Attending: Student in an Organized Health Care Education/Training Program | Admitting: Student in an Organized Health Care Education/Training Program

## 2022-01-10 DIAGNOSIS — R200 Anesthesia of skin: Secondary | ICD-10-CM | POA: Diagnosis not present

## 2022-01-10 DIAGNOSIS — M4326 Fusion of spine, lumbar region: Secondary | ICD-10-CM

## 2022-01-10 DIAGNOSIS — M545 Low back pain, unspecified: Secondary | ICD-10-CM | POA: Diagnosis not present

## 2022-01-10 DIAGNOSIS — M4316 Spondylolisthesis, lumbar region: Secondary | ICD-10-CM | POA: Diagnosis not present

## 2022-01-10 DIAGNOSIS — M5416 Radiculopathy, lumbar region: Secondary | ICD-10-CM

## 2022-01-10 DIAGNOSIS — M48061 Spinal stenosis, lumbar region without neurogenic claudication: Secondary | ICD-10-CM | POA: Diagnosis not present

## 2022-01-14 DIAGNOSIS — M5416 Radiculopathy, lumbar region: Secondary | ICD-10-CM | POA: Diagnosis not present

## 2022-01-14 DIAGNOSIS — M4326 Fusion of spine, lumbar region: Secondary | ICD-10-CM | POA: Diagnosis not present

## 2022-01-15 DIAGNOSIS — J3081 Allergic rhinitis due to animal (cat) (dog) hair and dander: Secondary | ICD-10-CM | POA: Diagnosis not present

## 2022-01-15 DIAGNOSIS — J301 Allergic rhinitis due to pollen: Secondary | ICD-10-CM | POA: Diagnosis not present

## 2022-01-15 DIAGNOSIS — J3089 Other allergic rhinitis: Secondary | ICD-10-CM | POA: Diagnosis not present

## 2022-01-20 DIAGNOSIS — J301 Allergic rhinitis due to pollen: Secondary | ICD-10-CM | POA: Diagnosis not present

## 2022-01-20 DIAGNOSIS — J3081 Allergic rhinitis due to animal (cat) (dog) hair and dander: Secondary | ICD-10-CM | POA: Diagnosis not present

## 2022-01-20 DIAGNOSIS — J3089 Other allergic rhinitis: Secondary | ICD-10-CM | POA: Diagnosis not present

## 2022-01-22 DIAGNOSIS — M5414 Radiculopathy, thoracic region: Secondary | ICD-10-CM | POA: Diagnosis not present

## 2022-02-04 DIAGNOSIS — J301 Allergic rhinitis due to pollen: Secondary | ICD-10-CM | POA: Diagnosis not present

## 2022-02-04 DIAGNOSIS — J3089 Other allergic rhinitis: Secondary | ICD-10-CM | POA: Diagnosis not present

## 2022-02-04 DIAGNOSIS — J3081 Allergic rhinitis due to animal (cat) (dog) hair and dander: Secondary | ICD-10-CM | POA: Diagnosis not present

## 2022-02-05 DIAGNOSIS — M5416 Radiculopathy, lumbar region: Secondary | ICD-10-CM | POA: Diagnosis not present

## 2022-02-05 DIAGNOSIS — M4326 Fusion of spine, lumbar region: Secondary | ICD-10-CM | POA: Diagnosis not present

## 2022-02-05 DIAGNOSIS — M5414 Radiculopathy, thoracic region: Secondary | ICD-10-CM | POA: Diagnosis not present

## 2022-02-20 DIAGNOSIS — J3089 Other allergic rhinitis: Secondary | ICD-10-CM | POA: Diagnosis not present

## 2022-02-20 DIAGNOSIS — J301 Allergic rhinitis due to pollen: Secondary | ICD-10-CM | POA: Diagnosis not present

## 2022-02-20 DIAGNOSIS — J3081 Allergic rhinitis due to animal (cat) (dog) hair and dander: Secondary | ICD-10-CM | POA: Diagnosis not present

## 2022-02-24 DIAGNOSIS — M5414 Radiculopathy, thoracic region: Secondary | ICD-10-CM | POA: Diagnosis not present

## 2022-02-27 ENCOUNTER — Telehealth: Payer: Self-pay | Admitting: Adult Health

## 2022-02-27 NOTE — Telephone Encounter (Signed)
Left message for patient to call back and schedule Medicare Annual Wellness Visit (AWV) either virtually or in office. Left  my Herbie Drape number [phone]   Last AWV ;03/05/21  please schedule at anytime with [facility] Nurse Health Advisor 1 or 2

## 2022-02-28 NOTE — Telephone Encounter (Signed)
Pt returned call regarding the AWV.  Pt stated she is not interested in scheduling this type of visit, adding she does not feel this type of visit is very productive.  Please do not schedule.

## 2022-03-03 NOTE — Telephone Encounter (Signed)
Documented on spreadsheet 

## 2022-03-06 DIAGNOSIS — J3081 Allergic rhinitis due to animal (cat) (dog) hair and dander: Secondary | ICD-10-CM | POA: Diagnosis not present

## 2022-03-06 DIAGNOSIS — J3089 Other allergic rhinitis: Secondary | ICD-10-CM | POA: Diagnosis not present

## 2022-03-06 DIAGNOSIS — J301 Allergic rhinitis due to pollen: Secondary | ICD-10-CM | POA: Diagnosis not present

## 2022-03-13 DIAGNOSIS — H1045 Other chronic allergic conjunctivitis: Secondary | ICD-10-CM | POA: Diagnosis not present

## 2022-03-13 DIAGNOSIS — J4530 Mild persistent asthma, uncomplicated: Secondary | ICD-10-CM | POA: Diagnosis not present

## 2022-03-13 DIAGNOSIS — J301 Allergic rhinitis due to pollen: Secondary | ICD-10-CM | POA: Diagnosis not present

## 2022-03-13 DIAGNOSIS — J3089 Other allergic rhinitis: Secondary | ICD-10-CM | POA: Diagnosis not present

## 2022-03-13 DIAGNOSIS — J3081 Allergic rhinitis due to animal (cat) (dog) hair and dander: Secondary | ICD-10-CM | POA: Diagnosis not present

## 2022-03-14 ENCOUNTER — Encounter: Payer: Self-pay | Admitting: Internal Medicine

## 2022-03-14 DIAGNOSIS — M8008XA Age-related osteoporosis with current pathological fracture, vertebra(e), initial encounter for fracture: Secondary | ICD-10-CM | POA: Diagnosis not present

## 2022-03-14 DIAGNOSIS — M5432 Sciatica, left side: Secondary | ICD-10-CM | POA: Diagnosis not present

## 2022-03-19 DIAGNOSIS — J3081 Allergic rhinitis due to animal (cat) (dog) hair and dander: Secondary | ICD-10-CM | POA: Diagnosis not present

## 2022-03-19 DIAGNOSIS — J301 Allergic rhinitis due to pollen: Secondary | ICD-10-CM | POA: Diagnosis not present

## 2022-03-19 DIAGNOSIS — J3089 Other allergic rhinitis: Secondary | ICD-10-CM | POA: Diagnosis not present

## 2022-04-02 DIAGNOSIS — J3089 Other allergic rhinitis: Secondary | ICD-10-CM | POA: Diagnosis not present

## 2022-04-02 DIAGNOSIS — J3081 Allergic rhinitis due to animal (cat) (dog) hair and dander: Secondary | ICD-10-CM | POA: Diagnosis not present

## 2022-04-02 DIAGNOSIS — J301 Allergic rhinitis due to pollen: Secondary | ICD-10-CM | POA: Diagnosis not present

## 2022-04-18 DIAGNOSIS — S32011G Stable burst fracture of first lumbar vertebra, subsequent encounter for fracture with delayed healing: Secondary | ICD-10-CM | POA: Diagnosis not present

## 2022-04-18 DIAGNOSIS — M5432 Sciatica, left side: Secondary | ICD-10-CM | POA: Diagnosis not present

## 2022-04-18 DIAGNOSIS — M4714 Other spondylosis with myelopathy, thoracic region: Secondary | ICD-10-CM | POA: Diagnosis not present

## 2022-04-18 DIAGNOSIS — M4726 Other spondylosis with radiculopathy, lumbar region: Secondary | ICD-10-CM | POA: Diagnosis not present

## 2022-04-21 DIAGNOSIS — J45909 Unspecified asthma, uncomplicated: Secondary | ICD-10-CM | POA: Diagnosis not present

## 2022-04-21 DIAGNOSIS — Z01810 Encounter for preprocedural cardiovascular examination: Secondary | ICD-10-CM | POA: Diagnosis not present

## 2022-04-21 DIAGNOSIS — R9431 Abnormal electrocardiogram [ECG] [EKG]: Secondary | ICD-10-CM | POA: Diagnosis not present

## 2022-04-21 DIAGNOSIS — Z01812 Encounter for preprocedural laboratory examination: Secondary | ICD-10-CM | POA: Diagnosis not present

## 2022-04-21 DIAGNOSIS — S32011G Stable burst fracture of first lumbar vertebra, subsequent encounter for fracture with delayed healing: Secondary | ICD-10-CM | POA: Diagnosis not present

## 2022-04-23 DIAGNOSIS — M5416 Radiculopathy, lumbar region: Secondary | ICD-10-CM | POA: Diagnosis not present

## 2022-04-23 DIAGNOSIS — M4306 Spondylolysis, lumbar region: Secondary | ICD-10-CM | POA: Diagnosis not present

## 2022-04-23 DIAGNOSIS — M48061 Spinal stenosis, lumbar region without neurogenic claudication: Secondary | ICD-10-CM | POA: Diagnosis not present

## 2022-04-23 DIAGNOSIS — M4326 Fusion of spine, lumbar region: Secondary | ICD-10-CM | POA: Diagnosis not present

## 2022-04-23 DIAGNOSIS — S32012A Unstable burst fracture of first lumbar vertebra, initial encounter for closed fracture: Secondary | ICD-10-CM | POA: Diagnosis not present

## 2022-04-23 DIAGNOSIS — S32011G Stable burst fracture of first lumbar vertebra, subsequent encounter for fracture with delayed healing: Secondary | ICD-10-CM | POA: Diagnosis not present

## 2022-04-23 DIAGNOSIS — J45909 Unspecified asthma, uncomplicated: Secondary | ICD-10-CM | POA: Diagnosis not present

## 2022-04-23 DIAGNOSIS — M8008XA Age-related osteoporosis with current pathological fracture, vertebra(e), initial encounter for fracture: Secondary | ICD-10-CM | POA: Diagnosis not present

## 2022-04-23 DIAGNOSIS — M4726 Other spondylosis with radiculopathy, lumbar region: Secondary | ICD-10-CM | POA: Diagnosis not present

## 2022-04-23 DIAGNOSIS — M5432 Sciatica, left side: Secondary | ICD-10-CM | POA: Diagnosis not present

## 2022-04-24 DIAGNOSIS — M48061 Spinal stenosis, lumbar region without neurogenic claudication: Secondary | ICD-10-CM | POA: Diagnosis not present

## 2022-04-24 DIAGNOSIS — J45909 Unspecified asthma, uncomplicated: Secondary | ICD-10-CM | POA: Diagnosis not present

## 2022-04-24 DIAGNOSIS — M8008XA Age-related osteoporosis with current pathological fracture, vertebra(e), initial encounter for fracture: Secondary | ICD-10-CM | POA: Diagnosis not present

## 2022-04-24 DIAGNOSIS — M5432 Sciatica, left side: Secondary | ICD-10-CM | POA: Diagnosis not present

## 2022-04-24 DIAGNOSIS — S32012A Unstable burst fracture of first lumbar vertebra, initial encounter for closed fracture: Secondary | ICD-10-CM | POA: Diagnosis not present

## 2022-04-24 DIAGNOSIS — M4726 Other spondylosis with radiculopathy, lumbar region: Secondary | ICD-10-CM | POA: Diagnosis not present

## 2022-05-08 DIAGNOSIS — J3089 Other allergic rhinitis: Secondary | ICD-10-CM | POA: Diagnosis not present

## 2022-05-08 DIAGNOSIS — J3081 Allergic rhinitis due to animal (cat) (dog) hair and dander: Secondary | ICD-10-CM | POA: Diagnosis not present

## 2022-05-08 DIAGNOSIS — J301 Allergic rhinitis due to pollen: Secondary | ICD-10-CM | POA: Diagnosis not present

## 2022-05-20 DIAGNOSIS — M4325 Fusion of spine, thoracolumbar region: Secondary | ICD-10-CM | POA: Diagnosis not present

## 2022-05-30 ENCOUNTER — Other Ambulatory Visit: Payer: Self-pay | Admitting: Adult Health

## 2022-06-03 DIAGNOSIS — J301 Allergic rhinitis due to pollen: Secondary | ICD-10-CM | POA: Diagnosis not present

## 2022-06-03 DIAGNOSIS — J3081 Allergic rhinitis due to animal (cat) (dog) hair and dander: Secondary | ICD-10-CM | POA: Diagnosis not present

## 2022-06-03 DIAGNOSIS — J3089 Other allergic rhinitis: Secondary | ICD-10-CM | POA: Diagnosis not present

## 2022-06-10 ENCOUNTER — Ambulatory Visit (INDEPENDENT_AMBULATORY_CARE_PROVIDER_SITE_OTHER): Payer: Medicare Other | Admitting: Internal Medicine

## 2022-06-10 ENCOUNTER — Encounter: Payer: Self-pay | Admitting: Internal Medicine

## 2022-06-10 VITALS — BP 110/80 | HR 62 | Temp 98.9°F | Wt 109.4 lb

## 2022-06-10 DIAGNOSIS — R6883 Chills (without fever): Secondary | ICD-10-CM | POA: Diagnosis not present

## 2022-06-10 DIAGNOSIS — R1031 Right lower quadrant pain: Secondary | ICD-10-CM | POA: Diagnosis not present

## 2022-06-10 DIAGNOSIS — R5383 Other fatigue: Secondary | ICD-10-CM

## 2022-06-10 LAB — COMPREHENSIVE METABOLIC PANEL
ALT: 14 U/L (ref 0–35)
AST: 17 U/L (ref 0–37)
Albumin: 4.6 g/dL (ref 3.5–5.2)
Alkaline Phosphatase: 75 U/L (ref 39–117)
BUN: 10 mg/dL (ref 6–23)
CO2: 28 mEq/L (ref 19–32)
Calcium: 9.9 mg/dL (ref 8.4–10.5)
Chloride: 102 mEq/L (ref 96–112)
Creatinine, Ser: 0.76 mg/dL (ref 0.40–1.20)
GFR: 75.28 mL/min (ref >60.00)
Glucose, Bld: 146 mg/dL — ABNORMAL HIGH (ref 70–99)
Potassium: 3.6 mEq/L (ref 3.5–5.1)
Sodium: 139 mEq/L (ref 135–145)
Total Bilirubin: 0.6 mg/dL (ref 0.2–1.2)
Total Protein: 7.1 g/dL (ref 6.0–8.3)

## 2022-06-10 LAB — CBC WITH DIFFERENTIAL/PLATELET
Basophils Absolute: 0.1 10*3/uL (ref 0.0–0.1)
Basophils Relative: 1.1 % (ref 0.0–3.0)
Eosinophils Absolute: 0.2 10*3/uL (ref 0.0–0.7)
Eosinophils Relative: 2.9 % (ref 0.0–5.0)
HCT: 43 % (ref 36.0–46.0)
Hemoglobin: 14.3 g/dL (ref 12.0–15.0)
Lymphocytes Relative: 19.9 % (ref 12.0–46.0)
Lymphs Abs: 1.2 10*3/uL (ref 0.7–4.0)
MCHC: 33.4 g/dL (ref 30.0–36.0)
MCV: 95.2 fl (ref 78.0–100.0)
Monocytes Absolute: 0.5 10*3/uL (ref 0.1–1.0)
Monocytes Relative: 7.9 % (ref 3.0–12.0)
Neutro Abs: 4.1 10*3/uL (ref 1.4–7.7)
Neutrophils Relative %: 68.2 % (ref 43.0–77.0)
Platelets: 398 10*3/uL (ref 150.0–400.0)
RBC: 4.51 Mil/uL (ref 3.87–5.11)
RDW: 13.8 % (ref 11.5–15.5)
WBC: 6.1 10*3/uL (ref 4.0–10.5)

## 2022-06-10 LAB — URINALYSIS, ROUTINE W REFLEX MICROSCOPIC
Bilirubin Urine: NEGATIVE
Hgb urine dipstick: NEGATIVE
Ketones, ur: NEGATIVE
Nitrite: NEGATIVE
RBC / HPF: NONE SEEN (ref 0–?)
Specific Gravity, Urine: 1.01 (ref 1.000–1.030)
Total Protein, Urine: NEGATIVE
Urine Glucose: NEGATIVE
Urobilinogen, UA: 0.2 (ref 0.0–1.0)
pH: 7.5 (ref 5.0–8.0)

## 2022-06-10 NOTE — Progress Notes (Signed)
Acute office Visit     CC/Reason for Visit: Malaise, fatigue, abdominal pain  HPI: Jennifer Deleon is a 76 y.o. female who is coming in today for the above mentioned reasons.  For the past 5 days she has been experiencing extreme fatigue, decreased appetite, chills without fever.  She has had increased bowel movements but not diarrhea.  She is typically constipated and has been having about 3 BMs a day.  No recent travel and no sick contacts that she is aware of.  She has not had any previous abdominal surgery other than tubal ligation many years ago.  She states the entire lower area of her abdomen is tender.  When asked to point to one area that is the most tender she points to her right lower quadrant.  No nausea or vomiting.  No URI symptoms.   Past Medical/Surgical History: Past Medical History:  Diagnosis Date   Arthritis    Asthma    Chicken pox    Colon polyps    Depression    Environmental allergies    allergies all year long   Hypothyroid    Osteoporosis    UTI (lower urinary tract infection)     Past Surgical History:  Procedure Laterality Date   CATARACT EXTRACTION Bilateral 2016   LAMINECTOMY     LUMBAR FUSION  2015   l4-L5   TUBAL LIGATION      Social History:  reports that she has never smoked. She has never used smokeless tobacco. She reports current alcohol use of about 7.0 standard drinks of alcohol per week. She reports that she does not use drugs.  Allergies: Allergies  Allergen Reactions   Augmentin [Amoxicillin-Pot Clavulanate] Diarrhea and Other (See Comments)    Severe Stomach cramps   Bactrim [Sulfamethoxazole-Trimethoprim] Swelling    Facial and lip swelling    Family History:  Family History  Problem Relation Age of Onset   Osteoarthritis Mother    Osteoarthritis Maternal Grandmother    Osteoarthritis Father    Hypertension Father    Heart failure Father    Prostate cancer Paternal Grandfather        mets to colon   Arthritis  Other    Prostate cancer Other    Mental illness Other      Current Outpatient Medications:    buPROPion (WELLBUTRIN XL) 300 MG 24 hr tablet, Take 300 mg by mouth daily. , Disp: , Rfl:    calcium-vitamin D (OSCAL-500) 500-400 MG-UNIT per tablet, Take 1 tablet by mouth 2 (two) times daily with a meal., Disp: , Rfl:    cetirizine (ZYRTEC) 10 MG tablet, Take 10 mg by mouth daily. , Disp: , Rfl:    diphenhydrAMINE (BENADRYL) 25 MG tablet, Take 25 mg by mouth every 6 (six) hours as needed for allergies., Disp: , Rfl:    docusate sodium (COLACE) 100 MG capsule, Take 100 mg by mouth 2 (two) times daily as needed for mild constipation. , Disp: , Rfl:    DULoxetine (CYMBALTA) 60 MG capsule, Take 60 mg by mouth daily. , Disp: , Rfl:    EPINEPHrine 0.3 mg/0.3 mL IJ SOAJ injection, INJECT INTO THE MIDDLE OF THE OUTER THIGH AND HOLD FOR 3 SECONDS AS NEEDED FOR SEVERE ALLERGIC REACTION THEN CALL 911 IF USED., Disp: , Rfl:    fexofenadine (ALLEGRA) 180 MG tablet, Take 180 mg by mouth daily. , Disp: , Rfl:    levothyroxine (SYNTHROID) 112 MCG tablet, TAKE 1 TABLET DAILY,  Disp: 90 tablet, Rfl: 3   montelukast (SINGULAIR) 10 MG tablet, Take 10 mg by mouth at bedtime., Disp: , Rfl:    Multiple Vitamin (MULTIVITAMIN WITH MINERALS) TABS, Take 1 tablet by mouth daily. , Disp: , Rfl:    pregabalin (LYRICA) 75 MG capsule, Take 75 mg by mouth 2 (two) times daily., Disp: , Rfl:   Review of Systems:  Constitutional: Denies fever, diaphoresis,. HEENT: Denies photophobia, eye pain, redness, hearing loss, ear pain, congestion, sore throat, rhinorrhea, sneezing, mouth sores, trouble swallowing, neck pain, neck stiffness and tinnitus.   Respiratory: Denies SOB, DOE, cough, chest tightness,  and wheezing.   Cardiovascular: Denies chest pain, palpitations and leg swelling.  Gastrointestinal: Denies nausea, vomiting,  constipation, blood in stool. Genitourinary: Denies dysuria, urgency, frequency, hematuria, flank pain and  difficulty urinating.  Endocrine: Denies: hot or cold intolerance, sweats, changes in hair or nails, polyuria, polydipsia. Musculoskeletal: Denies myalgias, back pain, joint swelling, arthralgias and gait problem.  Skin: Denies pallor, rash and wound.  Neurological: Denies dizziness, seizures, syncope, weakness, light-headedness, numbness and headaches.  Hematological: Denies adenopathy. Easy bruising, personal or family bleeding history  Psychiatric/Behavioral: Denies suicidal ideation, mood changes, confusion, nervousness, sleep disturbance and agitation    Physical Exam: Vitals:   06/10/22 0825  BP: 110/80  Pulse: 62  Temp: 98.9 F (37.2 C)  TempSrc: Oral  SpO2: 95%  Weight: 109 lb 6.4 oz (49.6 kg)    Body mass index is 19.38 kg/m.   Constitutional: NAD, calm, comfortable Eyes: PERRL, lids and conjunctivae normal ENMT: Mucous membranes are moist.  Abdomen: Positive bowel sounds, slightly tender to palpation of the right lower quadrant, no rebound, no guarding, negative Murphy sign.   Musculoskeletal: no clubbing / cyanosis. No joint deformity upper and lower extremities. Good ROM, no contractures. Normal muscle tone.  Skin: no rashes, lesions, ulcers. No induration Neurologic: CN 2-12 grossly intact. Sensation intact, DTR normal. Strength 5/5 in all 4.  Psychiatric: Normal judgment and insight. Alert and oriented x 3. Normal mood.    Impression and Plan:  RLQ abdominal pain - Plan: CBC with Differential/Platelet, Comprehensive metabolic panel, Urinalysis with Reflex Microscopic, Urinalysis with Reflex Microscopic, Comprehensive metabolic panel, CBC with Differential/Platelet  Fatigue, unspecified type  Chills (without fever)  -Wide differential at this point including unspecified viral illness, UTI, nephrolithiasis, even potentially early appendicitis.  However, she does not have an acute abdomen, no fever and no objective signs of infection. -Check CBC, CMP and UA,  she will monitor symptoms and return if not improved over the next week.  Time spent:32 minutes reviewing chart, interviewing and examining patient and formulating plan of care.     Lelon Frohlich, MD  Primary Care at [facility]

## 2022-06-13 ENCOUNTER — Encounter: Payer: Self-pay | Admitting: Nurse Practitioner

## 2022-06-13 ENCOUNTER — Ambulatory Visit (INDEPENDENT_AMBULATORY_CARE_PROVIDER_SITE_OTHER): Payer: Medicare Other | Admitting: Nurse Practitioner

## 2022-06-13 VITALS — BP 140/80 | HR 64 | Temp 95.9°F | Ht 63.0 in | Wt 109.0 lb

## 2022-06-13 DIAGNOSIS — R197 Diarrhea, unspecified: Secondary | ICD-10-CM

## 2022-06-13 DIAGNOSIS — R1031 Right lower quadrant pain: Secondary | ICD-10-CM | POA: Diagnosis not present

## 2022-06-13 MED ORDER — ONDANSETRON HCL 4 MG PO TABS: 4.0000 mg | ORAL_TABLET | Freq: Three times a day (TID) | ORAL | 0 refills | Status: DC | PRN

## 2022-06-13 NOTE — Patient Instructions (Addendum)
It was great to see you!  Start zofran every 8 hours as needed for nausea.   I have ordered a CT scan of your abdomen, they will call to schedule.   Keep drinking the water and gatorade and bland foods like crackers and soup   We will also check a stool study. Bring back stool sample Monday-Friday  Let's follow-up if your symptoms worsen or don't improve.   Take care,  Vance Peper, NP

## 2022-06-13 NOTE — Assessment & Plan Note (Signed)
She is still having ongoing right lower quadrant tenderness, diarrhea, and cramping for the past 7 days.  Recent CMP, CBC, UA reviewed and were within normal limits.  Her glucose was slightly elevated.  With right lower quadrant pain, will check a CT abdomen with contrast.  She can also take Zofran every 8 hours as needed for nausea.  Encourage fluids, bland foods.  ER precautions discussed.  Follow-up if symptoms or not improving.

## 2022-06-13 NOTE — Progress Notes (Signed)
Acute Office Visit  Subjective:     Patient ID: Jennifer Deleon, female    DOB: August 08, 1945, 76 y.o.   MRN: [id number]  Chief Complaint  Patient presents with   Follow-up    Pt states that she is still having loose stool and cramping and pt states that she isnt eating much, but staying dehydrated     HPI Patient is in today for diarrhea, cramping, and nausea.   She states that this started about a week ago. She denies fevers. She has also noticed a decreased appetite. She has been taking peptobismol which helps, but symptoms are still ongoing. She denies sick contacts and travel. She has a history of allergies and is always congested. She denies sore throat, ear pain, and cough. She has been eating crackers and trying to drink gatorade. She states ongoing tenderness to her right lower quadrant and diarrhea.   ROS See pertinent positives and negatives per HPI.     Objective:    BP (!) 140/80 (BP Location: Right Arm, Patient Position: Sitting, Cuff Size: Normal)   Pulse 64   Temp (!) 95.9 F (35.5 C) (Skin)   Ht '5\' 3"'$  (1.6 m)   Wt 109 lb (49.4 kg)   SpO2 92%   BMI 19.31 kg/m    Physical Exam Vitals and nursing note reviewed.  Constitutional:      General: She is not in acute distress.    Appearance: Normal appearance.  HENT:     Head: Normocephalic.  Eyes:     Conjunctiva/sclera: Conjunctivae normal.  Cardiovascular:     Rate and Rhythm: Normal rate and regular rhythm.     Pulses: Normal pulses.     Heart sounds: Normal heart sounds.  Pulmonary:     Effort: Pulmonary effort is normal.     Breath sounds: Normal breath sounds.  Abdominal:     Palpations: Abdomen is soft.     Tenderness: There is abdominal tenderness in the right lower quadrant. There is no guarding or rebound.  Musculoskeletal:     Cervical back: Normal range of motion.  Skin:    General: Skin is warm.  Neurological:     General: No focal deficit present.     Mental Status: She is alert and  oriented to person, place, and time.  Psychiatric:        Mood and Affect: Mood normal.        Behavior: Behavior normal.        Thought Content: Thought content normal.        Judgment: Judgment normal.       Assessment & Plan:   Problem List Items Addressed This Visit       Other   Right lower quadrant abdominal pain - Primary    She is still having ongoing right lower quadrant tenderness, diarrhea, and cramping for the past 7 days.  Recent CMP, CBC, UA reviewed and were within normal limits.  Her glucose was slightly elevated.  With right lower quadrant pain, will check a CT abdomen with contrast.  She can also take Zofran every 8 hours as needed for nausea.  Encourage fluids, bland foods.  ER precautions discussed.  Follow-up if symptoms or not improving.      Relevant Orders   CT Abdomen Pelvis W Contrast   Other Visit Diagnoses     Diarrhea, unspecified type       With ongoing symptoms, will check stool for c-diff, O&P, and culture. Continue fluids  and bland food.   Relevant Orders   Cdiff NAA+O+P+Stool Culture       Meds ordered this encounter  Medications   ondansetron (ZOFRAN) 4 MG tablet    Sig: Take 1 tablet (4 mg total) by mouth every 8 (eight) hours as needed for nausea or vomiting.    Dispense:  30 tablet    Refill:  0    Return if symptoms worsen or fail to improve.  Charyl Dancer, NP

## 2022-06-16 ENCOUNTER — Other Ambulatory Visit (INDEPENDENT_AMBULATORY_CARE_PROVIDER_SITE_OTHER): Payer: Medicare Other

## 2022-06-16 DIAGNOSIS — R197 Diarrhea, unspecified: Secondary | ICD-10-CM

## 2022-06-17 ENCOUNTER — Telehealth: Payer: Self-pay | Admitting: Nurse Practitioner

## 2022-06-17 NOTE — Telephone Encounter (Signed)
Hey pt said that she had head any thing regarding her ct scan , can you look into this , it was ordered on 06/13/22. thanks

## 2022-06-17 NOTE — Telephone Encounter (Signed)
Caller Name: Chancie Lampert Call back phone #: [phone]  Reason for Call: Please call pt regarding appt 12/08. She is wanting to hear back about any results. Still has not gotten MRI that was ordered

## 2022-06-20 ENCOUNTER — Telehealth: Payer: Self-pay | Admitting: Nurse Practitioner

## 2022-06-20 NOTE — Telephone Encounter (Signed)
Called informed pt that her results were neg for c-diff and and once the results of stool samples back we call her back with the results.pt understood results, and will wait on call back.

## 2022-06-20 NOTE — Telephone Encounter (Signed)
Caller Name: Tia[place], pt Call back phone #: [phone]  Reason for Call: Pt called to check on results of stool sample. She is hoping for results before the weekend.

## 2022-06-21 DIAGNOSIS — Z23 Encounter for immunization: Secondary | ICD-10-CM | POA: Diagnosis not present

## 2022-06-21 LAB — CDIFF NAA+O+P+STOOL CULTURE
E coli, Shiga toxin Assay: NEGATIVE
Toxigenic C. Difficile by PCR: NEGATIVE

## 2022-06-25 DIAGNOSIS — J3081 Allergic rhinitis due to animal (cat) (dog) hair and dander: Secondary | ICD-10-CM | POA: Diagnosis not present

## 2022-06-25 DIAGNOSIS — J3089 Other allergic rhinitis: Secondary | ICD-10-CM | POA: Diagnosis not present

## 2022-06-25 DIAGNOSIS — J301 Allergic rhinitis due to pollen: Secondary | ICD-10-CM | POA: Diagnosis not present

## 2022-07-10 DIAGNOSIS — J301 Allergic rhinitis due to pollen: Secondary | ICD-10-CM | POA: Diagnosis not present

## 2022-07-10 DIAGNOSIS — J3081 Allergic rhinitis due to animal (cat) (dog) hair and dander: Secondary | ICD-10-CM | POA: Diagnosis not present

## 2022-07-10 DIAGNOSIS — J3089 Other allergic rhinitis: Secondary | ICD-10-CM | POA: Diagnosis not present

## 2022-07-15 ENCOUNTER — Encounter: Payer: Self-pay | Admitting: Physician Assistant

## 2022-07-18 ENCOUNTER — Ambulatory Visit
Admission: RE | Admit: 2022-07-18 | Discharge: 2022-07-18 | Disposition: A | Discharge status: Home / Self Care | Payer: Medicare Other | Source: Ambulatory Visit | Attending: Nurse Practitioner | Admitting: Nurse Practitioner

## 2022-07-18 DIAGNOSIS — R197 Diarrhea, unspecified: Secondary | ICD-10-CM | POA: Diagnosis not present

## 2022-07-18 DIAGNOSIS — R1031 Right lower quadrant pain: Secondary | ICD-10-CM

## 2022-07-18 DIAGNOSIS — R140 Abdominal distension (gaseous): Secondary | ICD-10-CM | POA: Diagnosis not present

## 2022-07-18 DIAGNOSIS — R110 Nausea: Secondary | ICD-10-CM | POA: Diagnosis not present

## 2022-07-18 MED ORDER — IOPAMIDOL (ISOVUE-300) INJECTION 61%
100.0000 mL | Freq: Once | INTRAVENOUS | Status: AC | PRN
  Administered 2022-07-18: 100 mL via INTRAVENOUS

## 2022-07-21 DIAGNOSIS — M4325 Fusion of spine, thoracolumbar region: Secondary | ICD-10-CM | POA: Diagnosis not present

## 2022-08-04 ENCOUNTER — Ambulatory Visit (INDEPENDENT_AMBULATORY_CARE_PROVIDER_SITE_OTHER): Payer: Medicare Other | Admitting: Physician Assistant

## 2022-08-04 ENCOUNTER — Ambulatory Visit (INDEPENDENT_AMBULATORY_CARE_PROVIDER_SITE_OTHER)
Admission: RE | Admit: 2022-08-04 | Discharge: 2022-08-04 | Disposition: A | Discharge status: Home / Self Care | Payer: Medicare Other | Source: Ambulatory Visit | Attending: Physician Assistant | Admitting: Physician Assistant

## 2022-08-04 ENCOUNTER — Other Ambulatory Visit (INDEPENDENT_AMBULATORY_CARE_PROVIDER_SITE_OTHER): Payer: Medicare Other

## 2022-08-04 ENCOUNTER — Encounter: Payer: Self-pay | Admitting: Physician Assistant

## 2022-08-04 VITALS — BP 110/68 | HR 59 | Ht 63.0 in | Wt 111.0 lb

## 2022-08-04 DIAGNOSIS — R110 Nausea: Secondary | ICD-10-CM | POA: Diagnosis not present

## 2022-08-04 DIAGNOSIS — Z9889 Other specified postprocedural states: Secondary | ICD-10-CM | POA: Diagnosis not present

## 2022-08-04 DIAGNOSIS — R5383 Other fatigue: Secondary | ICD-10-CM | POA: Diagnosis not present

## 2022-08-04 DIAGNOSIS — R194 Change in bowel habit: Secondary | ICD-10-CM

## 2022-08-04 DIAGNOSIS — K5900 Constipation, unspecified: Secondary | ICD-10-CM | POA: Diagnosis not present

## 2022-08-04 DIAGNOSIS — R197 Diarrhea, unspecified: Secondary | ICD-10-CM | POA: Diagnosis not present

## 2022-08-04 LAB — CBC WITH DIFFERENTIAL/PLATELET
Basophils Absolute: 0.1 10*3/uL (ref 0.0–0.1)
Basophils Relative: 0.8 % (ref 0.0–3.0)
Eosinophils Absolute: 0.1 10*3/uL (ref 0.0–0.7)
Eosinophils Relative: 2 % (ref 0.0–5.0)
HCT: 39 % (ref 36.0–46.0)
Hemoglobin: 13.6 g/dL (ref 12.0–15.0)
Lymphocytes Relative: 21.8 % (ref 12.0–46.0)
Lymphs Abs: 1.4 10*3/uL (ref 0.7–4.0)
MCHC: 34.8 g/dL (ref 30.0–36.0)
MCV: 93.5 fl (ref 78.0–100.0)
Monocytes Absolute: 0.7 10*3/uL (ref 0.1–1.0)
Monocytes Relative: 10.8 % (ref 3.0–12.0)
Neutro Abs: 4.1 10*3/uL (ref 1.4–7.7)
Neutrophils Relative %: 64.6 % (ref 43.0–77.0)
Platelets: 341 10*3/uL (ref 150.0–400.0)
RBC: 4.17 Mil/uL (ref 3.87–5.11)
RDW: 13.7 % (ref 11.5–15.5)
WBC: 6.3 10*3/uL (ref 4.0–10.5)

## 2022-08-04 LAB — COMPREHENSIVE METABOLIC PANEL
ALT: 10 U/L (ref 0–35)
AST: 14 U/L (ref 0–37)
Albumin: 4.2 g/dL (ref 3.5–5.2)
Alkaline Phosphatase: 64 U/L (ref 39–117)
BUN: 16 mg/dL (ref 6–23)
CO2: 29 mEq/L (ref 19–32)
Calcium: 9.4 mg/dL (ref 8.4–10.5)
Chloride: 102 mEq/L (ref 96–112)
Creatinine, Ser: 0.67 mg/dL (ref 0.40–1.20)
GFR: 83.88 mL/min (ref >60.00)
Glucose, Bld: 109 mg/dL — ABNORMAL HIGH (ref 70–99)
Potassium: 3.6 mEq/L (ref 3.5–5.1)
Sodium: 139 mEq/L (ref 135–145)
Total Bilirubin: 0.2 mg/dL (ref 0.2–1.2)
Total Protein: 6.6 g/dL (ref 6.0–8.3)

## 2022-08-04 NOTE — Patient Instructions (Signed)
Your provider has requested that you go to the basement level for lab work before leaving today. Press "B" on the elevator. The lab is located at the first door on the left as you exit the elevator.  After you have your labs drawn, please go to the radiology department in our basement to have your x-ray.  The [facility] providers would like to encourage you to use [facility] to communicate with providers for non-urgent requests or questions.  Due to long hold times on the telephone, sending your provider a message by [facility] may be a faster and more efficient way to get a response.  Please allow 48 business hours for a response.  Please remember that this is for non-urgent requests.

## 2022-08-04 NOTE — Progress Notes (Signed)
Chief Complaint: Nausea and Diarrhea  HPI:    Jennifer Deleon is a 77 year old female with a past medical history as listed below, known to Dr. Hilarie Fredrickson, who was referred to me by [name], NP for a complaint of nausea and diarrhea.      02/18/2012 colonoscopy with Dr. Amedeo Plenty with one 4 mm polyp in the cecum and one 4 mm polyp in the proximal ascending colon.    01/29/2017 office visit with Dr. Hilarie Fredrickson for abdominal pain associated gas, bloating and loose stools.  At that time patient had a CT scan and given Bentyl 10 mg 1-2 tabs every 8 hours as needed for abdominal pain and loose stool.  CT was normal patient treated with Xifaxan.    06/13/2022 patient seen by PCP for right lower quadrant pain and diarrhea.  At that time discussed that this it started a week ago with a decrease in appetite.  She had been taking Pepto-Bismol which helped but symptoms are ongoing.  She had had a recent CMP, CBC and UA which were within normal limits, glucose slightly elevated.  She had a CT ordered.  Was told to take Zofran every 8 hours for nausea.  Also had stool studies ordered including a C. difficile, O&P and stool culture, these were negative.    07/18/2022 CTAP with contrast this is done for right lower quadrant pain, nausea and diarrhea as well as bloating for 3 months.  No acute findings in abdomen pelvis, large stool burden noted without significant change, recommend correlation for symptoms or signs of chronic constipation.    Today, the patient tells me that she has always had some chronic constipation but has typically been able to manage this with her diet and activity level.  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  In October she had her second spinal fusion and woke up with a distended abdomen and was told this was likely related to the surgery and narcotics and that when she stopped them this would go away but it did not stop.  In December she had 2 weeks of watery diarrhea that was just constant  and felt very ill, at that time had a workup by her PCP with a CT as above showing constipation and normal stool studies.  Her general surgeon told her to do 30 mL of mineral oil mixed with 30 mL of milk of magnesia, she did this twice and after the addition of a Dulcolax the next day ended up having the "damn burst", and felt like she had a good quantity of stool but continued with some abdominal pain for the next few days.  Since then she has really continued with diarrhea a couple of times in the morning typically.  Still feeling very fatigued with some generalized abdominal discomfort and nausea.  Has also noticed that she has no appetite.  She is trying to supplement with Gatorade etc. to keep up her strength.  She did try Pepto-Bismol the other day.    Denies fever, chills or blood in her stool.  Past Medical History:  Diagnosis Date   Arthritis    Asthma    Chicken pox    Colon polyps    Depression    Environmental allergies    allergies all year long   Hypothyroid    Osteoporosis    UTI (lower urinary tract infection)     Past Surgical History:  Procedure Laterality Date   CATARACT EXTRACTION Bilateral 2016  LAMINECTOMY     LUMBAR FUSION  2015   l4-L5   TUBAL LIGATION      Current Outpatient Medications  Medication Sig Dispense Refill   buPROPion (WELLBUTRIN XL) 300 MG 24 hr tablet Take 300 mg by mouth daily.      calcium-vitamin D (OSCAL-500) 500-400 MG-UNIT per tablet Take 1 tablet by mouth 2 (two) times daily with a meal.     cetirizine (ZYRTEC) 10 MG tablet Take 10 mg by mouth daily.      diphenhydrAMINE (BENADRYL) 25 MG tablet Take 25 mg by mouth every 6 (six) hours as needed for allergies.     docusate sodium (COLACE) 100 MG capsule Take 100 mg by mouth 2 (two) times daily as needed for mild constipation.      DULoxetine (CYMBALTA) 60 MG capsule Take 60 mg by mouth daily.      EPINEPHrine 0.3 mg/0.3 mL IJ SOAJ injection INJECT INTO THE MIDDLE OF THE OUTER THIGH AND  HOLD FOR 3 SECONDS AS NEEDED FOR SEVERE ALLERGIC REACTION THEN CALL 911 IF USED.     fexofenadine (ALLEGRA) 180 MG tablet Take 180 mg by mouth daily.      levothyroxine (SYNTHROID) 112 MCG tablet TAKE 1 TABLET DAILY 90 tablet 3   montelukast (SINGULAIR) 10 MG tablet Take 10 mg by mouth at bedtime.     Multiple Vitamin (MULTIVITAMIN WITH MINERALS) TABS Take 1 tablet by mouth daily.      pregabalin (LYRICA) 75 MG capsule Take 75 mg by mouth 2 (two) times daily.     ondansetron (ZOFRAN) 4 MG tablet Take 1 tablet (4 mg total) by mouth every 8 (eight) hours as needed for nausea or vomiting. (Patient not taking: Reported on 08/04/2022) 30 tablet 0   No current facility-administered medications for this visit.    Allergies as of 08/04/2022 - Review Complete 08/04/2022  Allergen Reaction Noted   Augmentin [amoxicillin-pot clavulanate] Diarrhea and Other (See Comments) 07/16/2012   Bactrim [sulfamethoxazole-trimethoprim] Swelling 07/16/2012    Family History  Problem Relation Age of Onset   Osteoarthritis Mother    Osteoarthritis Father    Hypertension Father    Heart failure Father    Osteoarthritis Maternal Grandmother    Prostate cancer Paternal Grandfather        mets to colon   Colon cancer Paternal Grandfather    Arthritis Other    Prostate cancer Other    Mental illness Other    Breast cancer Paternal Aunt     Social History   Socioeconomic History   Marital status: Married    Spouse name: Not on file   Number of children: Not on file   Years of education: Not on file   Highest education level: Bachelor's degree (e.g., BA, AB, BS)  Occupational History   Not on file  Tobacco Use   Smoking status: Never   Smokeless tobacco: Never  Vaping Use   Vaping Use: Never used  Substance and Sexual Activity   Alcohol use: Yes    Alcohol/week: 7.0 standard drinks of alcohol    Types: 7 Glasses of wine per week    Comment: a glass of wine with dinner each night   Drug use: No    Sexual activity: Not on file  Other Topics Concern   Not on file  Social History Narrative   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ighter    Married for  11 years    Children who live in [place]  Social Determinants of Health   Financial Resource Strain: Low Risk  (03/05/2021)   Overall Financial Resource Strain (CARDIA)    Difficulty of Paying Living Expenses: Not hard at all  Food Insecurity: No Food Insecurity (07/23/2021)   Hunger Vital Sign    Worried About Running Out of Food in the Last Year: Never true    Ran Out of Food in the Last Year: Never true  Transportation Needs: No Transportation Needs (07/23/2021)   PRAPARE - Hydrologist (Medical): No    Lack of Transportation (Non-Medical): No  Physical Activity: Sufficiently Active (07/23/2021)   Exercise Vital Sign    Days of Exercise per Week: 5 days    Minutes of Exercise per Session: 40 min  Stress: Stress Concern Present (07/23/2021)   [place]    Feeling of Stress : To some extent  Social Connections: Unknown (07/23/2021)   Social Connection and Isolation Panel [NHANES]    Frequency of Communication with Friends and Family: Twice a week    Frequency of Social Gatherings with Friends and Family: Twice a week    Attends Religious Services: Patient refused    Marine scientist or Organizations: Not on file    Attends Archivist Meetings: Not on file    Marital Status: Married  Intimate Partner Violence: Not At Risk (03/05/2021)   Humiliation, Afraid, Rape, and Kick questionnaire    Fear of Current or Ex-Partner: No    Emotionally Abused: No    Physically Abused: No    Sexually Abused: No    Review of Systems:    Constitutional: No weight loss, fever or chills Skin: No rash Cardiovascular: No chest pain Respiratory: No SOB Gastrointestinal: See HPI and otherwise negative Genitourinary: No dysuria Neurological: No  headache, dizziness or syncope Musculoskeletal: No new muscle or joint pain Hematologic: No bleeding Psychiatric: No history of depression or anxiety   Physical Exam:  Vital signs: BP 110/68   Pulse (!) 59   Ht '5\' 3"'$  (1.6 m)   Wt 111 lb (50.3 kg)   BMI 19.66 kg/m    Constitutional:   Pleasant Elderly Caucasian female appears to be in NAD, Well developed, Well nourished, alert and cooperative Head:  Normocephalic and atraumatic. Eyes:   PEERL, EOMI. No icterus. Conjunctiva pink. Ears:  Normal auditory acuity. Neck:  Supple Throat: Oral cavity and pharynx without inflammation, swelling or lesion.  Respiratory: Respirations even and unlabored. Lungs clear to auscultation bilaterally.   No wheezes, crackles, or rhonchi.  Cardiovascular: Normal S1, S2. No MRG. Regular rate and rhythm. No peripheral edema, cyanosis or pallor.  Gastrointestinal:  Soft, mild distention, mild generalized TTP, worse on the left, no rebound or guarding.  Decreased bowel sounds all 4 quadrants. No appreciable masses or hepatomegaly. Rectal:  Not performed.  Msk:  Symmetrical without gross deformities. Without edema, no deformity or joint abnormality.  Neurologic:  Alert and  oriented x4;  grossly normal neurologically.  Skin:   Dry and intact without significant lesions or rashes. Psychiatric: Demonstrates good judgement and reason without abnormal affect or behaviors.  RELEVANT LABS AND IMAGING: CBC    Component Value Date/Time   WBC 6.1 06/10/2022 0850   RBC 4.51 06/10/2022 0850   HGB 14.3 06/10/2022 0850   HCT 43.0 06/10/2022 0850   PLT 398.0 06/10/2022 0850   MCV 95.2 06/10/2022 0850   MCH 31.2 07/01/2019 0349   MCHC 33.4 06/10/2022  0850   RDW 13.8 06/10/2022 0850   LYMPHSABS 1.2 06/10/2022 0850   MONOABS 0.5 06/10/2022 0850   EOSABS 0.2 06/10/2022 0850   BASOSABS 0.1 06/10/2022 0850    CMP     Component Value Date/Time   NA 139 06/10/2022 0850   K 3.6 06/10/2022 0850   CL 102 06/10/2022  0850   CO2 28 06/10/2022 0850   GLUCOSE 146 (H) 06/10/2022 0850   BUN 10 06/10/2022 0850   CREATININE 0.76 06/10/2022 0850   CALCIUM 9.9 06/10/2022 0850   PROT 7.1 06/10/2022 0850   ALBUMIN 4.6 06/10/2022 0850   AST 17 06/10/2022 0850   ALT 14 06/10/2022 0850   ALKPHOS 75 06/10/2022 0850   BILITOT 0.6 06/10/2022 0850   GFRNONAA >60 07/01/2019 0349   GFRAA >60 07/01/2019 0349    Assessment: 1.  Change in bowel habits: Became severely constipated after recent spinal fusion, did a bowel prep but still continues with some diarrhea abdominal discomfort, decrease in appetite and nausea; consider most likely overflow constipation 2.  Nausea/decrease in appetite 3.  Generalized abdominal discomfort  Plan: 1.  Ordered 2 view abdominal x-ray today.  Pending results will consider a MiraLAX bowel purge. 2.  Ordered CBC/CMP given fatigue 3.  Patient to follow in clinic per recommendations after x-ray and labs above.  Ellouise Newer, PA-C [facility] 08/04/2022, 2:07 PM  Cc: [name], NP

## 2022-08-05 NOTE — Progress Notes (Signed)
Addendum: Reviewed and agree with assessment and management plan. Sherwin Hollingshed M, MD  

## 2022-08-08 ENCOUNTER — Telehealth: Payer: Self-pay | Admitting: Physician Assistant

## 2022-08-08 DIAGNOSIS — K5900 Constipation, unspecified: Secondary | ICD-10-CM

## 2022-08-08 DIAGNOSIS — R194 Change in bowel habit: Secondary | ICD-10-CM

## 2022-08-08 NOTE — Telephone Encounter (Signed)
Lm on vm for patient to return call 

## 2022-08-08 NOTE — Telephone Encounter (Signed)
Inbound call from patient stating she was advised to follow up with her symptoms with the nurse. Patient was last seen on 1/29 and is requesting a call back to discuss. Please advise.

## 2022-08-11 ENCOUNTER — Ambulatory Visit (INDEPENDENT_AMBULATORY_CARE_PROVIDER_SITE_OTHER)
Admission: RE | Admit: 2022-08-11 | Discharge: 2022-08-11 | Disposition: A | Discharge status: Home / Self Care | Payer: Medicare Other | Source: Ambulatory Visit | Attending: Physician Assistant | Admitting: Physician Assistant

## 2022-08-11 DIAGNOSIS — K5900 Constipation, unspecified: Secondary | ICD-10-CM | POA: Diagnosis not present

## 2022-08-11 DIAGNOSIS — R194 Change in bowel habit: Secondary | ICD-10-CM

## 2022-08-11 NOTE — Telephone Encounter (Signed)
Called and spoke with patient regarding recommendations. Pt has been advised to stop by the x-ray department in the basement of our office at her convenience. Pt is aware that no appt is necessary. Pt has been advised that we will be in touch after we have the x-ray results back. Pt verbalized understanding and had no concerns at the end of the call.

## 2022-08-11 NOTE — Telephone Encounter (Signed)
Patient called states the treatment recommended is not working would like to discuss next steps.

## 2022-08-11 NOTE — Telephone Encounter (Signed)
Called and spoke with patient. Pt states that she completed the Miralax bowel purge and has continued to take Miralax BID. Pt states that she is now having about 3-medium volume loose stools daily. Pt describes her stool as "an applesauce texture". Pt does not feel like she is emptying her bowels completely. Pt states that she is very bloated, nauseous, and her abdomen is tender. Pt is wanting to know how to proceed. I told pt that I was not sure if she would need a repeat x-ray to see if any progress has been made with emptying her bowel or she may need an additional bowel purge. I advised patient that I will check with you and see what you recommend. Please advise, thanks.

## 2022-08-13 DIAGNOSIS — J3081 Allergic rhinitis due to animal (cat) (dog) hair and dander: Secondary | ICD-10-CM | POA: Diagnosis not present

## 2022-08-13 DIAGNOSIS — J3089 Other allergic rhinitis: Secondary | ICD-10-CM | POA: Diagnosis not present

## 2022-08-13 DIAGNOSIS — J301 Allergic rhinitis due to pollen: Secondary | ICD-10-CM | POA: Diagnosis not present

## 2022-08-18 DIAGNOSIS — J3081 Allergic rhinitis due to animal (cat) (dog) hair and dander: Secondary | ICD-10-CM | POA: Diagnosis not present

## 2022-08-18 DIAGNOSIS — J3089 Other allergic rhinitis: Secondary | ICD-10-CM | POA: Diagnosis not present

## 2022-08-18 DIAGNOSIS — J301 Allergic rhinitis due to pollen: Secondary | ICD-10-CM | POA: Diagnosis not present

## 2022-08-22 DIAGNOSIS — J3089 Other allergic rhinitis: Secondary | ICD-10-CM | POA: Diagnosis not present

## 2022-08-22 DIAGNOSIS — J301 Allergic rhinitis due to pollen: Secondary | ICD-10-CM | POA: Diagnosis not present

## 2022-08-22 DIAGNOSIS — M25552 Pain in left hip: Secondary | ICD-10-CM | POA: Diagnosis not present

## 2022-08-22 DIAGNOSIS — J3081 Allergic rhinitis due to animal (cat) (dog) hair and dander: Secondary | ICD-10-CM | POA: Diagnosis not present

## 2022-08-22 DIAGNOSIS — M7062 Trochanteric bursitis, left hip: Secondary | ICD-10-CM | POA: Diagnosis not present

## 2022-08-22 DIAGNOSIS — Z681 Body mass index (BMI) 19 or less, adult: Secondary | ICD-10-CM | POA: Diagnosis not present

## 2022-08-27 DIAGNOSIS — J3089 Other allergic rhinitis: Secondary | ICD-10-CM | POA: Diagnosis not present

## 2022-08-27 DIAGNOSIS — J3081 Allergic rhinitis due to animal (cat) (dog) hair and dander: Secondary | ICD-10-CM | POA: Diagnosis not present

## 2022-08-27 DIAGNOSIS — J301 Allergic rhinitis due to pollen: Secondary | ICD-10-CM | POA: Diagnosis not present

## 2022-08-29 ENCOUNTER — Telehealth: Payer: Self-pay | Admitting: Physician Assistant

## 2022-08-29 MED ORDER — ONDANSETRON HCL 4 MG PO TABS: 4.0000 mg | ORAL_TABLET | Freq: Three times a day (TID) | ORAL | 0 refills | Status: DC | PRN

## 2022-08-29 NOTE — Telephone Encounter (Signed)
Inbound call from patient stating that she has an appointment on 3/7 with Ellouise Newer and she stated she is not doing well and requested to speak with nurse. Please advise.

## 2022-08-29 NOTE — Telephone Encounter (Signed)
Returned call to patient. She states that she does not feel well. Pt states that she is not constipated, she is having 5-6 loose stools (not everyday), and nausea. Pt has a prescription for Zofran but only has 3 pills left and no refills. I informed patient that we will send in new RX for her to take PRN for nausea. Pt states that her stomach hurts, I informed her that it is likely due to the diarrhea that she has been having. Pt states that she is not eating much - she reports losing about 8-10 lbs since her last office visit. I informed patient that it is important to stay hydrated when having diarrhea, I recommended that she drink Pedialyte, Gatorade, Body Armour, etc. Pt did tell me that she is still taking Miralax - she takes 1 dose 3 x/week and then 2 doses on the other days. I informed patient to hold Miralax over the weekend since she is having diarrhea. Pt will resume Miralax once stools are more formed and she has been advised to start off with 1/2-1 capful daily. Pt verbalized understanding and had no concerns at the end of the call.

## 2022-09-03 DIAGNOSIS — J3089 Other allergic rhinitis: Secondary | ICD-10-CM | POA: Diagnosis not present

## 2022-09-03 DIAGNOSIS — J3081 Allergic rhinitis due to animal (cat) (dog) hair and dander: Secondary | ICD-10-CM | POA: Diagnosis not present

## 2022-09-03 DIAGNOSIS — J301 Allergic rhinitis due to pollen: Secondary | ICD-10-CM | POA: Diagnosis not present

## 2022-09-10 DIAGNOSIS — M7062 Trochanteric bursitis, left hip: Secondary | ICD-10-CM | POA: Diagnosis not present

## 2022-09-10 DIAGNOSIS — J3081 Allergic rhinitis due to animal (cat) (dog) hair and dander: Secondary | ICD-10-CM | POA: Diagnosis not present

## 2022-09-10 DIAGNOSIS — J3089 Other allergic rhinitis: Secondary | ICD-10-CM | POA: Diagnosis not present

## 2022-09-10 DIAGNOSIS — J301 Allergic rhinitis due to pollen: Secondary | ICD-10-CM | POA: Diagnosis not present

## 2022-09-11 ENCOUNTER — Other Ambulatory Visit: Payer: Self-pay | Admitting: Student in an Organized Health Care Education/Training Program

## 2022-09-11 ENCOUNTER — Ambulatory Visit (INDEPENDENT_AMBULATORY_CARE_PROVIDER_SITE_OTHER): Payer: Medicare Other | Admitting: Physician Assistant

## 2022-09-11 ENCOUNTER — Encounter: Payer: Self-pay | Admitting: Physician Assistant

## 2022-09-11 VITALS — BP 90/60 | HR 67 | Ht 63.0 in | Wt 109.2 lb

## 2022-09-11 DIAGNOSIS — K5900 Constipation, unspecified: Secondary | ICD-10-CM | POA: Diagnosis not present

## 2022-09-11 DIAGNOSIS — R194 Change in bowel habit: Secondary | ICD-10-CM

## 2022-09-11 DIAGNOSIS — M5432 Sciatica, left side: Secondary | ICD-10-CM

## 2022-09-11 DIAGNOSIS — R634 Abnormal weight loss: Secondary | ICD-10-CM

## 2022-09-11 DIAGNOSIS — M7062 Trochanteric bursitis, left hip: Secondary | ICD-10-CM

## 2022-09-11 DIAGNOSIS — R5383 Other fatigue: Secondary | ICD-10-CM

## 2022-09-11 MED ORDER — SUTAB 1479-225-188 MG PO TABS: ORAL_TABLET | ORAL | 0 refills | Status: DC

## 2022-09-11 NOTE — Progress Notes (Signed)
Chief Complaint: Follow up Nausea and Diarrhea  HPI:    Jennifer Deleon is a  77 y/o female, known to Dr. Hilarie Fredrickson, who returns to clinic for follow up of nausea and diarrhea.      02/18/2012 colonoscopy with Dr. Amedeo Plenty with one 4 mm polyp in the cecum and a 4 mm polyp in the proximal ascending colon.    01/29/2017 office visit with Dr. Hilarie Fredrickson for abdominal pain associated gas, bloating and loose stools.  At that time patient had a CT scan and given Bentyl 10 mg 1-2 tabs every 8 hours as needed for abdominal pain and loose stool.  CT was normal patient treated with Xifaxan.    06/13/2022 patient seen by PCP for right lower quadrant pain and diarrhea.  At that time discussed that this it started a week ago with a decrease in appetite.  She had been taking Pepto-Bismol which helped but symptoms are ongoing.  She had had a recent CMP, CBC and UA which were within normal limits, glucose slightly elevated.  She had a CT ordered.  Was told to take Zofran every 8 hours for nausea.  Also had stool studies ordered including a C. difficile, O&P and stool culture, these were negative.    07/18/2022 CTAP with contrast this is done for right lower quadrant pain, nausea and diarrhea as well as bloating for 3 months.  No acute findings in abdomen pelvis, large stool burden noted without significant change, recommend correlation for symptoms or signs of chronic constipation.    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  Then had some watery diarrhea.  CT showed constipation and had normal stool studies.  She was able to have a good bowel movement still felt fatigued with generalized abdominal discomfort and a decrease in appetite.  At that time were ordered 2 view abdominal x-ray and a  CBC/CMP given fatigue.  Labs returned normal.    08/05/2022 abdominal x-ray with large colonic stool burden.  Patient told to do a MiraLAX bowel purge and then start 1 capful of MiraLAX twice daily.    08/11/2018 4 repeat x-ray was good with a normal stool burden.  At that time told to continue MiraLAX twice daily and increase to 3 times daily if needed.    08/29/2022 patient called and described she is not feeling well.  Described 5-6 loose stools and nausea.  Was given a refill of her Zofran.  Described an 8 to 10 pound weight loss since her last office visit.  She was still taking MiraLAX, 1 dose 3 times a week.  She was told to hold this due to diarrhea.    Today, the patient tells me that she really continues to feel bad.  She did the bowel purge and felt like her abdomen and distention went down a little bit but felt very weak for a few days, then continued on MiraLAX twice daily but continued to feel bad with decreased appetite and mostly liquid stools that sometimes hitter in the evenings and would be 5-6 loose stools, in general felt bloated and fatigued and read that this could be a side effect so she stopped this eventually after trying different regimens and skipping a few days here  and there.  Now over the past week she has moved to stool softeners, she has been taking them not every day but 3 to 4 tablets at night.  She did have a very good soft solid stool within the past 4 days, the last time she took stool softeners was yesterday evening, 3 of them.  Tells me it is previously controlled her constipation well.  Tells me she has lost about 4 pounds and notices a decrease in appetite and generalized fatigue.  She is not sure while this is happening.    Denies fever, chills, blood in her stool or vomiting.  Past Medical History:  Diagnosis Date   Arthritis    Asthma    Chicken pox    Colon polyps    Depression    Environmental allergies    allergies all year long   Hypothyroid     Osteoporosis    UTI (lower urinary tract infection)     Past Surgical History:  Procedure Laterality Date   CATARACT EXTRACTION Bilateral 2016   LAMINECTOMY     LUMBAR FUSION  2015   l4-L5   TUBAL LIGATION      Current Outpatient Medications  Medication Sig Dispense Refill   buPROPion (WELLBUTRIN XL) 300 MG 24 hr tablet Take 300 mg by mouth daily.      calcium-vitamin D (OSCAL-500) 500-400 MG-UNIT per tablet Take 1 tablet by mouth 2 (two) times daily with a meal.     cetirizine (ZYRTEC) 10 MG tablet Take 10 mg by mouth daily.      diphenhydrAMINE (BENADRYL) 25 MG tablet Take 25 mg by mouth every 6 (six) hours as needed for allergies.     docusate sodium (COLACE) 100 MG capsule Take 100 mg by mouth 2 (two) times daily as needed for mild constipation.      DULoxetine (CYMBALTA) 60 MG capsule Take 60 mg by mouth daily.      EPINEPHrine 0.3 mg/0.3 mL IJ SOAJ injection INJECT INTO THE MIDDLE OF THE OUTER THIGH AND HOLD FOR 3 SECONDS AS NEEDED FOR SEVERE ALLERGIC REACTION THEN CALL 911 IF USED.     fexofenadine (ALLEGRA) 180 MG tablet Take 180 mg by mouth daily.      levothyroxine (SYNTHROID) 112 MCG tablet TAKE 1 TABLET DAILY 90 tablet 3   montelukast (SINGULAIR) 10 MG tablet Take 10 mg by mouth at bedtime.     Multiple Vitamin (MULTIVITAMIN WITH MINERALS) TABS Take 1 tablet by mouth daily.      ondansetron (ZOFRAN) 4 MG tablet Take 1 tablet (4 mg total) by mouth every 8 (eight) hours as needed for nausea or vomiting. 30 tablet 0   pregabalin (LYRICA) 75 MG capsule Take 75 mg by mouth 2 (two) times daily.     No current facility-administered medications for this visit.    Allergies as of 09/11/2022 - Review Complete 08/04/2022  Allergen Reaction Noted   Augmentin [amoxicillin-pot clavulanate] Diarrhea and Other (See Comments) 07/16/2012   Bactrim [sulfamethoxazole-trimethoprim] Swelling 07/16/2012    Family History  Problem Relation Age of Onset   Osteoarthritis Mother     Osteoarthritis Father    Hypertension Father    Heart failure Father    Osteoarthritis Maternal Grandmother    Prostate cancer Paternal Grandfather        mets to colon   Colon cancer Paternal Grandfather    Arthritis Other    Prostate cancer Other    Mental illness Other    Breast cancer Paternal  Aunt     Social History   Socioeconomic History   Marital status: Married    Spouse name: Not on file   Number of children: 2   Years of education: Not on file   Highest education level: Bachelor's degree (e.g., BA, AB, BS)  Occupational History   Occupation: retired  Tobacco Use   Smoking status: Never   Smokeless tobacco: Never  Vaping Use   Vaping Use: Never used  Substance and Sexual Activity   Alcohol use: Yes    Alcohol/week: 7.0 standard drinks of alcohol    Types: 7 Glasses of wine per week    Comment: a glass of wine with dinner each night   Drug use: No   Sexual activity: Not on file  Other Topics Concern   Not on file  Social History Narrative   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ighter    Married for  11 years    Children who live in [place] Strain: Low Risk  (03/05/2021)   Overall Financial Resource Strain (CARDIA)    Difficulty of Paying Living Expenses: Not hard at all  Food Insecurity: No Food Insecurity (07/23/2021)   Hunger Vital Sign    Worried About Running Out of Food in the Last Year: Never true    [place] in the Last Year: Never true  Transportation Needs: No Transportation Needs (07/23/2021)   PRAPARE - Hydrologist (Medical): No    Lack of Transportation (Non-Medical): No  Physical Activity: Sufficiently Active (07/23/2021)   Exercise Vital Sign    Days of Exercise per Week: 5 days    Minutes of Exercise per Session: 40 min  Stress: Stress Concern Present (07/23/2021)   [place]    Feeling of  Stress : To some extent  Social Connections: Unknown (07/23/2021)   Social Connection and Isolation Panel [NHANES]    Frequency of Communication with Friends and Family: Twice a week    Frequency of Social Gatherings with Friends and Family: Twice a week    Attends Religious Services: Patient refused    Marine scientist or Organizations: Not on file    Attends Archivist Meetings: Not on file    Marital Status: Married  Intimate Partner Violence: Not At Risk (03/05/2021)   Humiliation, Afraid, Rape, and Kick questionnaire    Fear of Current or Ex-Partner: No    Emotionally Abused: No    Physically Abused: No    Sexually Abused: No    Review of Systems:    Constitutional: No weight loss, fever or chills  Cardiovascular: No chest pain Respiratory: No SOB  Gastrointestinal: See HPI and otherwise negative   Physical Exam:  Vital signs: BP 90/60   Pulse 67   Ht '5\' 3"'$  (1.6 m)   Wt 109 lb 3.2 oz (49.5 kg)   SpO2 97%   BMI 19.34 kg/m    Constitutional:   Pleasant Elderly Caucasian female appears to be in NAD, Well developed, Well nourished, alert and cooperative Respiratory: Respirations even and unlabored. Lungs clear to auscultation bilaterally.   No wheezes, crackles, or rhonchi.  Cardiovascular: Normal S1, S2. No MRG. Regular rate and rhythm. No peripheral edema, cyanosis or pallor.  Gastrointestinal:  Soft, nondistended, nontender. No rebound or guarding.  Decreased bowel sounds all 4 quadrants. No appreciable masses or hepatomegaly. Rectal:  Not  performed.  Psychiatric:  Demonstrates good judgement and reason without abnormal affect or behaviors.  RELEVANT LABS AND IMAGING: CBC    Component Value Date/Time   WBC 6.3 08/04/2022 1442   RBC 4.17 08/04/2022 1442   HGB 13.6 08/04/2022 1442   HCT 39.0 08/04/2022 1442   PLT 341.0 08/04/2022 1442   MCV 93.5 08/04/2022 1442   MCH 31.2 07/01/2019 0349   MCHC 34.8 08/04/2022 1442   RDW 13.7 08/04/2022 1442    LYMPHSABS 1.4 08/04/2022 1442   MONOABS 0.7 08/04/2022 1442   EOSABS 0.1 08/04/2022 1442   BASOSABS 0.1 08/04/2022 1442    CMP     Component Value Date/Time   NA 139 08/04/2022 1442   K 3.6 08/04/2022 1442   CL 102 08/04/2022 1442   CO2 29 08/04/2022 1442   GLUCOSE 109 (H) 08/04/2022 1442   BUN 16 08/04/2022 1442   CREATININE 0.67 08/04/2022 1442   CALCIUM 9.4 08/04/2022 1442   PROT 6.6 08/04/2022 1442   ALBUMIN 4.2 08/04/2022 1442   AST 14 08/04/2022 1442   ALT 10 08/04/2022 1442   ALKPHOS 64 08/04/2022 1442   BILITOT 0.2 08/04/2022 1442   GFRNONAA >60 07/01/2019 0349   GFRAA >60 07/01/2019 0349    Assessment: 1.  Change in bowel habits to Constipation: Continues for the patient, again this acutely changed after her spinal fusion surgery and we have not been able to get her back to normal, MiraLAX takes too much energy out of her and gives her decreased appetite and nausea, she was recently switched to stool softeners and were not sure these are working either with abdominal distention and general fatigue, last colonoscopy in 2013 with polyps, recent CT with a large stool burden otherwise nonconcerning; concern for large polyp versus other form of obstruction including stricture in the bowel versus colorectal malignancy 2.  Weight loss: About 3 to 4 pounds over the past couple of months with below and above 3.  Decrease in appetite: Likely due to laxative/constipation  Plan: 1.  Discussed with patient that given that we have already done a CT scan and multiple x-rays and she is still being bothered by this would recommend a colonoscopy for further evaluation.  This was scheduled Dr. Hilarie Fredrickson in the [facility].  Did provide the patient a detailed list of risks for the procedure and she agrees to proceed. Patient is appropriate for endoscopic procedure(s) in the ambulatory ([place]) setting.  2.  Encouraged the patient to continue her stool softeners.  Recommend she schedule 2 in the morning and  2 in the evening for the next 2 days and titrate as necessary.  If this works well for her and she feels like she is getting better over the next couple of weeks she can cancel colonoscopy. 3.  Patient to follow in clinic per recommendations after time of colonoscopy.  Ellouise Newer, PA-C [facility] 09/11/2022, 9:05 AM  Cc: Jennifer Peng, Jennifer Deleon

## 2022-09-11 NOTE — Patient Instructions (Addendum)
You have been scheduled for a colonoscopy. Please follow written instructions given to you at your visit today.  Please pick up your prep supplies at the pharmacy within the next 1-3 days. If you use inhalers (even only as needed), please bring them with you on the day of your procedure.   Please increase stool softeners to twice daily.  ______________________________________________________  If your blood pressure at your visit was 140/90 or greater, please contact your primary care physician to follow up on this.  _______________________________________________________  If you are age 39 or older, your body mass index should be between 23-30. Your Body mass index is 19.34 kg/m. If this is out of the aforementioned range listed, please consider follow up with your Primary Care Provider.  If you are age 34 or younger, your body mass index should be between 19-25. Your Body mass index is 19.34 kg/m. If this is out of the aformentioned range listed, please consider follow up with your Primary Care Provider.   ________________________________________________________  The [facility] providers would like to encourage you to use [facility] to communicate with providers for non-urgent requests or questions.  Due to long hold times on the telephone, sending your provider a message by [facility] may be a faster and more efficient way to get a response.  Please allow 48 business hours for a response.  Please remember that this is for non-urgent requests.  _______________________________________________________ It was a pleasure to see you today!  Thank you for trusting me with your gastrointestinal care!

## 2022-09-14 ENCOUNTER — Ambulatory Visit
Admission: RE | Admit: 2022-09-14 | Discharge: 2022-09-14 | Disposition: A | Discharge status: Home / Self Care | Payer: Medicare Other | Source: Ambulatory Visit | Attending: Student in an Organized Health Care Education/Training Program | Admitting: Student in an Organized Health Care Education/Training Program

## 2022-09-14 DIAGNOSIS — M7062 Trochanteric bursitis, left hip: Secondary | ICD-10-CM

## 2022-09-14 DIAGNOSIS — M25552 Pain in left hip: Secondary | ICD-10-CM | POA: Diagnosis not present

## 2022-09-14 DIAGNOSIS — M5432 Sciatica, left side: Secondary | ICD-10-CM

## 2022-09-16 NOTE — Progress Notes (Signed)
Addendum: Reviewed and agree with assessment and management plan. Kynlee Koenigsberg M, MD  

## 2022-09-17 ENCOUNTER — Telehealth: Payer: Self-pay | Admitting: Physician Assistant

## 2022-09-17 DIAGNOSIS — J3081 Allergic rhinitis due to animal (cat) (dog) hair and dander: Secondary | ICD-10-CM | POA: Diagnosis not present

## 2022-09-17 DIAGNOSIS — J3089 Other allergic rhinitis: Secondary | ICD-10-CM | POA: Diagnosis not present

## 2022-09-17 DIAGNOSIS — J301 Allergic rhinitis due to pollen: Secondary | ICD-10-CM | POA: Diagnosis not present

## 2022-09-17 NOTE — Telephone Encounter (Signed)
Patient called requesting a call back for a nurse regarding her constipation not improving .Please advise

## 2022-09-17 NOTE — Telephone Encounter (Signed)
Left message on vm for patient to return call - I asked that patient confirm that she is taking 2 stool softeners in the morning and 2 in the evening.

## 2022-09-17 NOTE — Telephone Encounter (Signed)
Patient returned call. Pt states that she was taking 2 stool softeners in the AM and 2 in the PM prior to her office visit and she was doing better. Pt states that her last BM was a week ago, she has only passed "a few tiny pieces". Pt states that she stopped the stool softeners and took 1 Dulcolax and 1 dose of Miralax yesterday and no results. I informed patient that she can take Miralax up to 3 doses a day. Pt states that she didn't tolerate Miralax well before, but she took 2 doses of Miralax today and still not results. Pt denies any pressure in the rectum, has not used an enema. Pt does report bloating and abdominal pain. Pt states that when she eats her abdomen is tight and distended. Pt is passing some gas. Pt states that she is not sure how to proceed since x-ray last month did not show any blockage. Please advise, thanks.

## 2022-09-18 MED ORDER — LINACLOTIDE 72 MCG PO CAPS: 72.0000 ug | ORAL_CAPSULE | Freq: Every day | ORAL | 0 refills | Status: DC

## 2022-09-18 NOTE — Addendum Note (Signed)
Addended by: Yevette Edwards on: 09/18/2022 12:24 PM   Modules accepted: Orders

## 2022-09-18 NOTE — Telephone Encounter (Signed)
Called and left patient a detailed vm letting her know that Anderson Malta would like for her to try Linzess 72 mcg daily for 1 week, if the medication works well we can send in a prescription. Pt has been advised that I have placed samples of Linzess 72 mcg at the 2nd floor receptionist desk for her to pick up at her convenience.   Linzess 72 mcg LOT: [id number] EXP: 08/2024

## 2022-09-23 ENCOUNTER — Encounter: Payer: Self-pay | Admitting: Adult Health

## 2022-09-23 ENCOUNTER — Ambulatory Visit (INDEPENDENT_AMBULATORY_CARE_PROVIDER_SITE_OTHER): Payer: Medicare Other | Admitting: Adult Health

## 2022-09-23 VITALS — BP 100/60 | HR 76 | Temp 97.6°F | Ht 63.0 in | Wt 110.0 lb

## 2022-09-23 DIAGNOSIS — J3089 Other allergic rhinitis: Secondary | ICD-10-CM | POA: Diagnosis not present

## 2022-09-23 DIAGNOSIS — R194 Change in bowel habit: Secondary | ICD-10-CM

## 2022-09-23 DIAGNOSIS — J3081 Allergic rhinitis due to animal (cat) (dog) hair and dander: Secondary | ICD-10-CM | POA: Diagnosis not present

## 2022-09-23 DIAGNOSIS — M7062 Trochanteric bursitis, left hip: Secondary | ICD-10-CM | POA: Diagnosis not present

## 2022-09-23 DIAGNOSIS — J301 Allergic rhinitis due to pollen: Secondary | ICD-10-CM | POA: Diagnosis not present

## 2022-09-23 NOTE — Patient Instructions (Addendum)
I would try linzes.   You can also look into a probiotic/prebiotic called Seed - this can be found at [email or web address]

## 2022-09-23 NOTE — Progress Notes (Signed)
Subjective:    Patient ID: Jennifer Deleon, female    DOB: [birth year], 77 y.o.   MRN: [id number]  HPI 77 year old female who  has a past medical history of Arthritis, Asthma, Chicken pox, Colon polyps, Depression, Environmental allergies, Hypothyroid, Osteoporosis, and UTI (lower urinary tract infection).  He presents to the office today for constipation, nausea, diarrhea, constipation, and loss of appetite.  Has been an ongoing issue since October 2023 when she had a spinal fusion.  Has been seen by gastroenterology and had a CT of the abdomen pelvis done in January 2024 which showed a large stool burden.  Recently followed up with gastroenterology approximately 10 days ago continued symptoms.  She has been taking MiraLAX twice daily without improvement.  In general she feels fatigued, bloated, nauseous, and lack of appetite.  Gastroenterology provided her with a sample of Linzess but she has not tried it yet.  She also has a colonoscopy scheduled for May 1  Denies fevers, chills, vomiting, or blood in her stool.   Review of Systems See HPI   Past Medical History:  Diagnosis Date   Arthritis    Asthma    Chicken pox    Colon polyps    Depression    Environmental allergies    allergies all year long   Hypothyroid    Osteoporosis    UTI (lower urinary tract infection)     Social History   Socioeconomic History   Marital status: Married    Spouse name: Not on file   Number of children: 2   Years of education: Not on file   Highest education level: Bachelor's degree (e.g., BA, AB, BS)  Occupational History   Occupation: retired  Tobacco Use   Smoking status: Never   Smokeless tobacco: Never  Vaping Use   Vaping Use: Never used  Substance and Sexual Activity   Alcohol use: Yes    Alcohol/week: 7.0 standard drinks of alcohol    Types: 7 Glasses of wine per week    Comment: a glass of wine with dinner each night   Drug use: No   Sexual activity: Not on file  Other  Topics Concern   Not on file  Social History Narrative   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ighter    Married for  11 years    Children who live in [place] Strain: Low Risk  (03/05/2021)   Overall Financial Resource Strain (CARDIA)    Difficulty of Paying Living Expenses: Not hard at all  Food Insecurity: No Food Insecurity (09/22/2022)   Hunger Vital Sign    Worried About Running Out of Food in the Last Year: Never true    [place] in the Last Year: Never true  Transportation Needs: No Transportation Needs (09/22/2022)   PRAPARE - Hydrologist (Medical): No    Lack of Transportation (Non-Medical): No  Physical Activity: Sufficiently Active (09/22/2022)   Exercise Vital Sign    Days of Exercise per Week: 6 days    Minutes of Exercise per Session: 30 min  Stress: Stress Concern Present (09/22/2022)   [place]    Feeling of Stress : To some extent  Social Connections: Unknown (09/22/2022)   Social Connection and Isolation Panel [NHANES]    Frequency of Communication with Friends and Family: Twice a week    Frequency  of Social Gatherings with Friends and Family: Once a week    Attends Religious Services: Patient declined    Marine scientist or Organizations: Yes    Attends Music therapist: More than 4 times per year    Marital Status: Married  Human resources officer Violence: Not At Risk (03/05/2021)   Humiliation, Afraid, Rape, and Kick questionnaire    Fear of Current or Ex-Partner: No    Emotionally Abused: No    Physically Abused: No    Sexually Abused: No    Past Surgical History:  Procedure Laterality Date   CATARACT EXTRACTION Bilateral 2016   LAMINECTOMY     LUMBAR FUSION  2015   l4-L5   TUBAL LIGATION      Family History  Problem Relation Age of Onset   Osteoarthritis Mother    Osteoarthritis Father     Hypertension Father    Heart failure Father    Osteoarthritis Maternal Grandmother    Prostate cancer Paternal Grandfather        mets to colon   Colon cancer Paternal Grandfather    Arthritis Other    Prostate cancer Other    Mental illness Other    Breast cancer Paternal Aunt     Allergies  Allergen Reactions   Augmentin [Amoxicillin-Pot Clavulanate] Diarrhea and Other (See Comments)    Severe Stomach cramps   Bactrim [Sulfamethoxazole-Trimethoprim] Swelling    Facial and lip swelling    Current Outpatient Medications on File Prior to Visit  Medication Sig Dispense Refill   buPROPion (WELLBUTRIN XL) 300 MG 24 hr tablet Take 300 mg by mouth daily.      cetirizine (ZYRTEC) 10 MG tablet Take 10 mg by mouth daily.      diclofenac (VOLTAREN) 50 MG EC tablet Take 50 mg by mouth 2 (two) times daily.     diphenhydrAMINE (BENADRYL) 25 MG tablet Take 25 mg by mouth every 6 (six) hours as needed for allergies.     DULoxetine (CYMBALTA) 60 MG capsule Take 60 mg by mouth daily.      EPINEPHrine 0.3 mg/0.3 mL IJ SOAJ injection INJECT INTO THE MIDDLE OF THE OUTER THIGH AND HOLD FOR 3 SECONDS AS NEEDED FOR SEVERE ALLERGIC REACTION THEN CALL 911 IF USED.     fexofenadine (ALLEGRA) 180 MG tablet Take 180 mg by mouth daily.      levothyroxine (SYNTHROID) 112 MCG tablet TAKE 1 TABLET DAILY 90 tablet 3   montelukast (SINGULAIR) 10 MG tablet Take 10 mg by mouth at bedtime.     Multiple Vitamin (MULTIVITAMIN WITH MINERALS) TABS Take 1 tablet by mouth daily.      ondansetron (ZOFRAN) 4 MG tablet Take 1 tablet (4 mg total) by mouth every 8 (eight) hours as needed for nausea or vomiting. 30 tablet 0   Sodium Sulfate-Mag Sulfate-KCl (SUTAB) [phone] MG TABS Use as directed for colonoscopy. MANUFACTURER CODES!! BIN: [id number] PCN: CN GROUP: [id number] MEMBER ID: [id number] AS SECONDARY INSURANCE ;NO PRIOR AUTHORIZATION 24 tablet 0   No current facility-administered medications on file prior to visit.     BP 100/60   Pulse 76   Temp 97.6 F (36.4 C) (Oral)   Ht 5\' 3"  (1.6 m)   Wt 110 lb (49.9 kg)   SpO2 96%   BMI 19.49 kg/m       Objective:   Physical Exam Vitals and nursing note reviewed.  Constitutional:      Appearance: Normal appearance.  Cardiovascular:  Rate and Rhythm: Normal rate and regular rhythm.     Pulses: Normal pulses.     Heart sounds: Normal heart sounds.  Pulmonary:     Effort: Pulmonary effort is normal.     Breath sounds: Normal breath sounds.  Abdominal:     General: Bowel sounds are normal. There is distension.     Tenderness: There is generalized abdominal tenderness.  Skin:    General: Skin is warm and dry.  Neurological:     General: No focal deficit present.     Mental Status: She is alert and oriented to person, place, and time.  Psychiatric:        Mood and Affect: Mood normal.        Behavior: Behavior normal.        Thought Content: Thought content normal.        Judgment: Judgment normal.       Assessment & Plan:  1. Change in bowel habits - ? IBS-C.  -Advised to try Linzess first.  If that does not work she can then try a good biotics such as seed.  Advise follow-up with gastroenterology when she tries Linzess for a minimum of 1 week.  [name], NP  Time spent with patient today was 32 minutes which consisted of chart review, discussing constipation and IBS,  work up, treatment answering questions and documentation.

## 2022-09-25 DIAGNOSIS — M25652 Stiffness of left hip, not elsewhere classified: Secondary | ICD-10-CM | POA: Diagnosis not present

## 2022-09-25 DIAGNOSIS — M25462 Effusion, left knee: Secondary | ICD-10-CM | POA: Diagnosis not present

## 2022-09-25 DIAGNOSIS — M25552 Pain in left hip: Secondary | ICD-10-CM | POA: Diagnosis not present

## 2022-09-25 DIAGNOSIS — M6281 Muscle weakness (generalized): Secondary | ICD-10-CM | POA: Diagnosis not present

## 2022-09-25 DIAGNOSIS — R293 Abnormal posture: Secondary | ICD-10-CM | POA: Diagnosis not present

## 2022-09-29 DIAGNOSIS — R293 Abnormal posture: Secondary | ICD-10-CM | POA: Diagnosis not present

## 2022-09-29 DIAGNOSIS — M25462 Effusion, left knee: Secondary | ICD-10-CM | POA: Diagnosis not present

## 2022-09-29 DIAGNOSIS — M6281 Muscle weakness (generalized): Secondary | ICD-10-CM | POA: Diagnosis not present

## 2022-09-29 DIAGNOSIS — M25652 Stiffness of left hip, not elsewhere classified: Secondary | ICD-10-CM | POA: Diagnosis not present

## 2022-09-29 DIAGNOSIS — M25552 Pain in left hip: Secondary | ICD-10-CM | POA: Diagnosis not present

## 2022-09-30 DIAGNOSIS — J3089 Other allergic rhinitis: Secondary | ICD-10-CM | POA: Diagnosis not present

## 2022-09-30 DIAGNOSIS — J301 Allergic rhinitis due to pollen: Secondary | ICD-10-CM | POA: Diagnosis not present

## 2022-09-30 DIAGNOSIS — J3081 Allergic rhinitis due to animal (cat) (dog) hair and dander: Secondary | ICD-10-CM | POA: Diagnosis not present

## 2022-09-30 MED ORDER — LINACLOTIDE 72 MCG PO CAPS: 72.0000 ug | ORAL_CAPSULE | Freq: Every day | ORAL | 3 refills | Status: DC

## 2022-09-30 NOTE — Telephone Encounter (Signed)
Prescription sent to pharmacy.

## 2022-09-30 NOTE — Addendum Note (Signed)
Addended by: Rosanne Sack R on: 09/30/2022 03:06 PM   Modules accepted: Orders

## 2022-09-30 NOTE — Telephone Encounter (Signed)
Inbound call from patient stating she would like lizess prescribed to her due to her taking samples of the medication and the medication working well. Please advise.   Thank you

## 2022-10-01 DIAGNOSIS — M25552 Pain in left hip: Secondary | ICD-10-CM | POA: Diagnosis not present

## 2022-10-01 DIAGNOSIS — M6281 Muscle weakness (generalized): Secondary | ICD-10-CM | POA: Diagnosis not present

## 2022-10-01 DIAGNOSIS — M25462 Effusion, left knee: Secondary | ICD-10-CM | POA: Diagnosis not present

## 2022-10-01 DIAGNOSIS — M25652 Stiffness of left hip, not elsewhere classified: Secondary | ICD-10-CM | POA: Diagnosis not present

## 2022-10-01 DIAGNOSIS — R293 Abnormal posture: Secondary | ICD-10-CM | POA: Diagnosis not present

## 2022-10-06 DIAGNOSIS — G5702 Lesion of sciatic nerve, left lower limb: Secondary | ICD-10-CM | POA: Diagnosis not present

## 2022-10-06 DIAGNOSIS — M67952 Unspecified disorder of synovium and tendon, left thigh: Secondary | ICD-10-CM | POA: Diagnosis not present

## 2022-10-06 DIAGNOSIS — M1612 Unilateral primary osteoarthritis, left hip: Secondary | ICD-10-CM | POA: Diagnosis not present

## 2022-10-06 DIAGNOSIS — M76892 Other specified enthesopathies of left lower limb, excluding foot: Secondary | ICD-10-CM | POA: Diagnosis not present

## 2022-10-07 DIAGNOSIS — J3081 Allergic rhinitis due to animal (cat) (dog) hair and dander: Secondary | ICD-10-CM | POA: Diagnosis not present

## 2022-10-07 DIAGNOSIS — J3089 Other allergic rhinitis: Secondary | ICD-10-CM | POA: Diagnosis not present

## 2022-10-07 DIAGNOSIS — J301 Allergic rhinitis due to pollen: Secondary | ICD-10-CM | POA: Diagnosis not present

## 2022-10-08 DIAGNOSIS — M25552 Pain in left hip: Secondary | ICD-10-CM | POA: Diagnosis not present

## 2022-10-08 DIAGNOSIS — M25462 Effusion, left knee: Secondary | ICD-10-CM | POA: Diagnosis not present

## 2022-10-08 DIAGNOSIS — M6281 Muscle weakness (generalized): Secondary | ICD-10-CM | POA: Diagnosis not present

## 2022-10-08 DIAGNOSIS — R293 Abnormal posture: Secondary | ICD-10-CM | POA: Diagnosis not present

## 2022-10-08 DIAGNOSIS — M25652 Stiffness of left hip, not elsewhere classified: Secondary | ICD-10-CM | POA: Diagnosis not present

## 2022-10-09 DIAGNOSIS — M6281 Muscle weakness (generalized): Secondary | ICD-10-CM | POA: Diagnosis not present

## 2022-10-09 DIAGNOSIS — M25652 Stiffness of left hip, not elsewhere classified: Secondary | ICD-10-CM | POA: Diagnosis not present

## 2022-10-09 DIAGNOSIS — R293 Abnormal posture: Secondary | ICD-10-CM | POA: Diagnosis not present

## 2022-10-09 DIAGNOSIS — M25462 Effusion, left knee: Secondary | ICD-10-CM | POA: Diagnosis not present

## 2022-10-09 DIAGNOSIS — M25552 Pain in left hip: Secondary | ICD-10-CM | POA: Diagnosis not present

## 2022-10-13 DIAGNOSIS — M25552 Pain in left hip: Secondary | ICD-10-CM | POA: Diagnosis not present

## 2022-10-13 DIAGNOSIS — M6281 Muscle weakness (generalized): Secondary | ICD-10-CM | POA: Diagnosis not present

## 2022-10-13 DIAGNOSIS — M25462 Effusion, left knee: Secondary | ICD-10-CM | POA: Diagnosis not present

## 2022-10-13 DIAGNOSIS — M25652 Stiffness of left hip, not elsewhere classified: Secondary | ICD-10-CM | POA: Diagnosis not present

## 2022-10-13 DIAGNOSIS — R293 Abnormal posture: Secondary | ICD-10-CM | POA: Diagnosis not present

## 2022-10-14 ENCOUNTER — Encounter: Payer: Self-pay | Admitting: Adult Health

## 2022-10-14 ENCOUNTER — Ambulatory Visit (INDEPENDENT_AMBULATORY_CARE_PROVIDER_SITE_OTHER): Payer: Medicare Other | Admitting: Adult Health

## 2022-10-14 VITALS — BP 100/80 | HR 76 | Temp 97.5°F | Ht 63.0 in | Wt 108.0 lb

## 2022-10-14 DIAGNOSIS — E559 Vitamin D deficiency, unspecified: Secondary | ICD-10-CM

## 2022-10-14 DIAGNOSIS — R194 Change in bowel habit: Secondary | ICD-10-CM

## 2022-10-14 DIAGNOSIS — R630 Anorexia: Secondary | ICD-10-CM

## 2022-10-14 DIAGNOSIS — K5900 Constipation, unspecified: Secondary | ICD-10-CM

## 2022-10-14 DIAGNOSIS — R5383 Other fatigue: Secondary | ICD-10-CM

## 2022-10-14 NOTE — Patient Instructions (Addendum)
I am going to check your Vitamin D and B12 levels today   I have referred you to [facility] - they will call you to schedule

## 2022-10-14 NOTE — Progress Notes (Signed)
Subjective:    Patient ID: Jennifer Deleon, female    DOB: Mar 08, 1946, 77 y.o.   MRN: [id number]  HPI 77 year old female who  has a past medical history of Arthritis, Asthma, Chicken pox, Colon polyps, Depression, Environmental allergies, Hypothyroid, Osteoporosis, and UTI (lower urinary tract infection).  Jennifer Deleon was seen in the office about 3 weeks ago for constipation,nausea, diarrhea, constipation, and loss of appetite.  This has been present since October 2023 when Jennifer Deleon had a spinal fusion.  Has been seen by gastroenterology and had a CT of the abdomen pelvis done in January 2024 which showed a large stool burden.  Recently followed up with gastroenterology approximately 10 days ago continued symptoms.  Jennifer Deleon has been taking MiraLAX twice daily without improvement.  In general Jennifer Deleon feels fatigued, bloated, nauseous, and lack of appetite.  Gastroenterology provided her with a sample of Linzess but Jennifer Deleon has not tried it yet when I last saw her.  Jennifer Deleon also has a colonoscopy scheduled for May 1  Today Jennifer Deleon reports that Jennifer Deleon continues to have all the GI problems. Jennifer Deleon took the sample of Linzess for a week and did have a bowel movement but when Jennifer Deleon went to get her prescription it was $500/month. Jennifer Deleon has also started  SEED probiotic x 1 week which may or may not be beneficial.   Jennifer Deleon is wanting a second opinion with GI    Review of Systems See HPI   Past Medical History:  Diagnosis Date   Arthritis    Asthma    Chicken pox    Colon polyps    Depression    Environmental allergies    allergies all year long   Hypothyroid    Osteoporosis    UTI (lower urinary tract infection)     Social History   Socioeconomic History   Marital status: Married    Spouse name: Not on file   Number of children: 2   Years of education: Not on file   Highest education level: Bachelor's degree (e.g., BA, AB, BS)  Occupational History   Occupation: retired  Tobacco Use   Smoking status: Never   Smokeless  tobacco: Never  Vaping Use   Vaping Use: Never used  Substance and Sexual Activity   Alcohol use: Yes    Alcohol/week: 7.0 standard drinks of alcohol    Types: 7 Glasses of wine per week    Comment: a glass of wine with dinner each night   Drug use: No   Sexual activity: Not on file  Other Topics Concern   Not on file  Social History Narrative   Retired from Museum/gallery exhibitions officer    Married for  11 years    Children who live in [place]      Social Determinants of Health   Financial Resource Strain: Low Risk  (03/05/2021)   Overall Financial Resource Strain (CARDIA)    Difficulty of Paying Living Expenses: Not hard at all  Food Insecurity: No Food Insecurity (09/22/2022)   Hunger Vital Sign    Worried About Running Out of Food in the Last Year: Never true    Ran Out of Food in the Last Year: Never true  Transportation Needs: No Transportation Needs (09/22/2022)   PRAPARE - Administrator, Civil Service (Medical): No    Lack of Transportation (Non-Medical): No  Physical Activity: Sufficiently Active (09/22/2022)   Exercise Vital Sign    Days of Exercise per Week: 6 days  Minutes of Exercise per Session: 30 min  Stress: Stress Concern Present (09/22/2022)   [employer] - Occupational Stress Questionnaire    Feeling of Stress : To some extent  Social Connections: Unknown (09/22/2022)   Social Connection and Isolation Panel [NHANES]    Frequency of Communication with Friends and Family: Twice a week    Frequency of Social Gatherings with Friends and Family: Once a week    Attends Religious Services: Patient declined    Database administratorActive Member of Clubs or Organizations: Yes    Attends Engineer, structuralClub or Organization Meetings: More than 4 times per year    Marital Status: Married  Catering managerntimate Partner Violence: Not At Risk (03/05/2021)   Humiliation, Afraid, Rape, and Kick questionnaire    Fear of Current or Ex-Partner: No    Emotionally Abused: No    Physically  Abused: No    Sexually Abused: No    Past Surgical History:  Procedure Laterality Date   CATARACT EXTRACTION Bilateral 2016   LAMINECTOMY     LUMBAR FUSION  2015   l4-L5   TUBAL LIGATION      Family History  Problem Relation Age of Onset   Osteoarthritis Mother    Osteoarthritis Father    Hypertension Father    Heart failure Father    Osteoarthritis Maternal Grandmother    Prostate cancer Paternal Grandfather        mets to colon   Colon cancer Paternal Grandfather    Arthritis Other    Prostate cancer Other    Mental illness Other    Breast cancer Paternal Aunt     Allergies  Allergen Reactions   Augmentin [Amoxicillin-Pot Clavulanate] Diarrhea and Other (See Comments)    Severe Stomach cramps   Bactrim [Sulfamethoxazole-Trimethoprim] Swelling    Facial and lip swelling    Current Outpatient Medications on File Prior to Visit  Medication Sig Dispense Refill   buPROPion (WELLBUTRIN XL) 300 MG 24 hr tablet Take 300 mg by mouth daily.      cetirizine (ZYRTEC) 10 MG tablet Take 10 mg by mouth daily.      diphenhydrAMINE (BENADRYL) 25 MG tablet Take 25 mg by mouth every 6 (six) hours as needed for allergies.     DULoxetine (CYMBALTA) 60 MG capsule Take 60 mg by mouth daily.      EPINEPHrine 0.3 mg/0.3 mL IJ SOAJ injection INJECT INTO THE MIDDLE OF THE OUTER THIGH AND HOLD FOR 3 SECONDS AS NEEDED FOR SEVERE ALLERGIC REACTION THEN CALL 911 IF USED.     fexofenadine (ALLEGRA) 180 MG tablet Take 180 mg by mouth daily.      levothyroxine (SYNTHROID) 112 MCG tablet TAKE 1 TABLET DAILY 90 tablet 3   montelukast (SINGULAIR) 10 MG tablet Take 10 mg by mouth at bedtime.     Multiple Vitamin (MULTIVITAMIN WITH MINERALS) TABS Take 1 tablet by mouth daily.      ondansetron (ZOFRAN) 4 MG tablet Take 1 tablet (4 mg total) by mouth every 8 (eight) hours as needed for nausea or vomiting. 30 tablet 0   Probiotic Product (PROBIOTIC ADVANCED Deleon)      Sodium Sulfate-Mag Sulfate-KCl (SUTAB)  [phone] MG TABS Use as directed for colonoscopy. MANUFACTURER CODES!! BIN: [id number] PCN: CN GROUP: [id number] MEMBER ID: [id number];RUN AS SECONDARY INSURANCE ;NO PRIOR AUTHORIZATION 24 tablet 0   No current facility-administered medications on file prior to visit.    BP 100/80   Pulse 76   Temp (!) 97.5 F (36.4 C) (  Oral)   Ht 5\' 3"  (1.6 m)   Wt 108 lb (49 kg)   SpO2 96%   BMI 19.13 kg/m       Objective:   Physical Exam Vitals and nursing note reviewed.  Constitutional:      Appearance: Normal appearance.  Abdominal:     General: Abdomen is flat. Bowel sounds are normal.     Palpations: Abdomen is soft. There is shifting dullness.     Tenderness: There is generalized abdominal tenderness.  Skin:    General: Skin is warm and dry.     Capillary Refill: Capillary refill takes less than 2 seconds.  Neurological:     General: No focal deficit present.     Mental Status: Jennifer Deleon is alert and oriented to person, place, and time.  Psychiatric:        Mood and Affect: Mood normal.        Behavior: Behavior normal.        Thought Content: Thought content normal.        Judgment: Judgment normal.        Assessment & Plan:  1. Change in bowel habits - Continue with Probiotic - Will refer to [facility] per patient request for second opinion - Ambulatory referral to Gastroenterology - VITAMIN D 25 Hydroxy (Vit-D Deficiency, Fractures); Future - Vitamin B12; Future - Vitamin B12 - VITAMIN D 25 Hydroxy (Vit-D Deficiency, Fractures)  2. Other fatigue - Likely due to constipation  - Ambulatory referral to Gastroenterology - VITAMIN D 25 Hydroxy (Vit-D Deficiency, Fractures); Future - Vitamin B12; Future - Vitamin B12 - VITAMIN D 25 Hydroxy (Vit-D Deficiency, Fractures)   3. Decreased appetite  - Ambulatory referral to Gastroenterology - VITAMIN D 25 Hydroxy (Vit-D Deficiency, Fractures); Future - Vitamin B12; Future - Vitamin B12 - VITAMIN D 25 Hydroxy (Vit-D Deficiency,  Fractures)  4. Constipation, unspecified constipation type  - Ambulatory referral to Gastroenterology - VITAMIN D 25 Hydroxy (Vit-D Deficiency, Fractures); Future - Vitamin B12; Future - Vitamin B12 - VITAMIN D 25 Hydroxy (Vit-D Deficiency, Fractures)  5. Vitamin D deficiency  - Ambulatory referral to Gastroenterology - VITAMIN D 25 Hydroxy (Vit-D Deficiency, Fractures); Future - Vitamin B12; Future - Vitamin B12 - VITAMIN D 25 Hydroxy (Vit-D Deficiency, Fractures)  Shirline Frees, NP  Time spent with patient today was 33 minutes which consisted of chart review, discussing diagnosis, work up, treatment answering questions and documentation.

## 2022-10-15 LAB — VITAMIN D 25 HYDROXY (VIT D DEFICIENCY, FRACTURES): VITD: 37.08 ng/mL (ref 30.00–100.00)

## 2022-10-15 LAB — VITAMIN B12: Vitamin B-12: 545 pg/mL (ref 211–911)

## 2022-10-16 DIAGNOSIS — M25552 Pain in left hip: Secondary | ICD-10-CM | POA: Diagnosis not present

## 2022-10-16 DIAGNOSIS — M25652 Stiffness of left hip, not elsewhere classified: Secondary | ICD-10-CM | POA: Diagnosis not present

## 2022-10-16 DIAGNOSIS — R293 Abnormal posture: Secondary | ICD-10-CM | POA: Diagnosis not present

## 2022-10-16 DIAGNOSIS — M25462 Effusion, left knee: Secondary | ICD-10-CM | POA: Diagnosis not present

## 2022-10-16 DIAGNOSIS — M6281 Muscle weakness (generalized): Secondary | ICD-10-CM | POA: Diagnosis not present

## 2022-10-17 DIAGNOSIS — M7062 Trochanteric bursitis, left hip: Secondary | ICD-10-CM | POA: Diagnosis not present

## 2022-10-17 DIAGNOSIS — S32011G Stable burst fracture of first lumbar vertebra, subsequent encounter for fracture with delayed healing: Secondary | ICD-10-CM | POA: Diagnosis not present

## 2022-10-17 DIAGNOSIS — K5909 Other constipation: Secondary | ICD-10-CM | POA: Diagnosis not present

## 2022-10-17 DIAGNOSIS — M4325 Fusion of spine, thoracolumbar region: Secondary | ICD-10-CM | POA: Diagnosis not present

## 2022-10-20 DIAGNOSIS — M25552 Pain in left hip: Secondary | ICD-10-CM | POA: Diagnosis not present

## 2022-10-20 DIAGNOSIS — M25462 Effusion, left knee: Secondary | ICD-10-CM | POA: Diagnosis not present

## 2022-10-20 DIAGNOSIS — M25652 Stiffness of left hip, not elsewhere classified: Secondary | ICD-10-CM | POA: Diagnosis not present

## 2022-10-20 DIAGNOSIS — M6281 Muscle weakness (generalized): Secondary | ICD-10-CM | POA: Diagnosis not present

## 2022-10-20 DIAGNOSIS — R293 Abnormal posture: Secondary | ICD-10-CM | POA: Diagnosis not present

## 2022-10-21 DIAGNOSIS — M461 Sacroiliitis, not elsewhere classified: Secondary | ICD-10-CM | POA: Diagnosis not present

## 2022-10-22 DIAGNOSIS — J3081 Allergic rhinitis due to animal (cat) (dog) hair and dander: Secondary | ICD-10-CM | POA: Diagnosis not present

## 2022-10-22 DIAGNOSIS — J301 Allergic rhinitis due to pollen: Secondary | ICD-10-CM | POA: Diagnosis not present

## 2022-10-22 DIAGNOSIS — J3089 Other allergic rhinitis: Secondary | ICD-10-CM | POA: Diagnosis not present

## 2022-10-23 DIAGNOSIS — M25652 Stiffness of left hip, not elsewhere classified: Secondary | ICD-10-CM | POA: Diagnosis not present

## 2022-10-23 DIAGNOSIS — R293 Abnormal posture: Secondary | ICD-10-CM | POA: Diagnosis not present

## 2022-10-23 DIAGNOSIS — M25552 Pain in left hip: Secondary | ICD-10-CM | POA: Diagnosis not present

## 2022-10-23 DIAGNOSIS — M25462 Effusion, left knee: Secondary | ICD-10-CM | POA: Diagnosis not present

## 2022-10-23 DIAGNOSIS — M6281 Muscle weakness (generalized): Secondary | ICD-10-CM | POA: Diagnosis not present

## 2022-10-24 DIAGNOSIS — M461 Sacroiliitis, not elsewhere classified: Secondary | ICD-10-CM | POA: Diagnosis not present

## 2022-10-27 DIAGNOSIS — M6281 Muscle weakness (generalized): Secondary | ICD-10-CM | POA: Diagnosis not present

## 2022-10-27 DIAGNOSIS — M25652 Stiffness of left hip, not elsewhere classified: Secondary | ICD-10-CM | POA: Diagnosis not present

## 2022-10-27 DIAGNOSIS — M25552 Pain in left hip: Secondary | ICD-10-CM | POA: Diagnosis not present

## 2022-10-27 DIAGNOSIS — M25462 Effusion, left knee: Secondary | ICD-10-CM | POA: Diagnosis not present

## 2022-10-27 DIAGNOSIS — R293 Abnormal posture: Secondary | ICD-10-CM | POA: Diagnosis not present

## 2022-10-30 DIAGNOSIS — M25652 Stiffness of left hip, not elsewhere classified: Secondary | ICD-10-CM | POA: Diagnosis not present

## 2022-10-30 DIAGNOSIS — M25462 Effusion, left knee: Secondary | ICD-10-CM | POA: Diagnosis not present

## 2022-10-30 DIAGNOSIS — M6281 Muscle weakness (generalized): Secondary | ICD-10-CM | POA: Diagnosis not present

## 2022-10-30 DIAGNOSIS — M25552 Pain in left hip: Secondary | ICD-10-CM | POA: Diagnosis not present

## 2022-10-30 DIAGNOSIS — R293 Abnormal posture: Secondary | ICD-10-CM | POA: Diagnosis not present

## 2022-11-04 DIAGNOSIS — J3081 Allergic rhinitis due to animal (cat) (dog) hair and dander: Secondary | ICD-10-CM | POA: Diagnosis not present

## 2022-11-04 DIAGNOSIS — J3089 Other allergic rhinitis: Secondary | ICD-10-CM | POA: Diagnosis not present

## 2022-11-04 DIAGNOSIS — J301 Allergic rhinitis due to pollen: Secondary | ICD-10-CM | POA: Diagnosis not present

## 2022-11-05 ENCOUNTER — Encounter: Payer: Medicare Other | Admitting: Internal Medicine

## 2022-11-07 DIAGNOSIS — K5909 Other constipation: Secondary | ICD-10-CM | POA: Diagnosis not present

## 2022-11-07 DIAGNOSIS — R140 Abdominal distension (gaseous): Secondary | ICD-10-CM | POA: Diagnosis not present

## 2022-11-07 DIAGNOSIS — Z1211 Encounter for screening for malignant neoplasm of colon: Secondary | ICD-10-CM | POA: Diagnosis not present

## 2022-11-12 DIAGNOSIS — Z1211 Encounter for screening for malignant neoplasm of colon: Secondary | ICD-10-CM | POA: Diagnosis not present

## 2022-11-12 DIAGNOSIS — K644 Residual hemorrhoidal skin tags: Secondary | ICD-10-CM | POA: Diagnosis not present

## 2022-11-12 LAB — HM COLONOSCOPY

## 2022-11-18 DIAGNOSIS — J3081 Allergic rhinitis due to animal (cat) (dog) hair and dander: Secondary | ICD-10-CM | POA: Diagnosis not present

## 2022-11-18 DIAGNOSIS — J301 Allergic rhinitis due to pollen: Secondary | ICD-10-CM | POA: Diagnosis not present

## 2022-11-18 DIAGNOSIS — J3089 Other allergic rhinitis: Secondary | ICD-10-CM | POA: Diagnosis not present

## 2022-11-25 DIAGNOSIS — M7062 Trochanteric bursitis, left hip: Secondary | ICD-10-CM | POA: Diagnosis not present

## 2022-11-25 DIAGNOSIS — Z681 Body mass index (BMI) 19 or less, adult: Secondary | ICD-10-CM | POA: Diagnosis not present

## 2022-12-02 DIAGNOSIS — J301 Allergic rhinitis due to pollen: Secondary | ICD-10-CM | POA: Diagnosis not present

## 2022-12-02 DIAGNOSIS — J3089 Other allergic rhinitis: Secondary | ICD-10-CM | POA: Diagnosis not present

## 2022-12-02 DIAGNOSIS — J3081 Allergic rhinitis due to animal (cat) (dog) hair and dander: Secondary | ICD-10-CM | POA: Diagnosis not present

## 2022-12-12 DIAGNOSIS — K638219 Small intestinal bacterial overgrowth, unspecified: Secondary | ICD-10-CM | POA: Diagnosis not present

## 2022-12-12 DIAGNOSIS — K63829 Intestinal methanogen overgrowth, unspecified: Secondary | ICD-10-CM | POA: Diagnosis not present

## 2022-12-16 DIAGNOSIS — J301 Allergic rhinitis due to pollen: Secondary | ICD-10-CM | POA: Diagnosis not present

## 2022-12-16 DIAGNOSIS — J3081 Allergic rhinitis due to animal (cat) (dog) hair and dander: Secondary | ICD-10-CM | POA: Diagnosis not present

## 2022-12-16 DIAGNOSIS — J3089 Other allergic rhinitis: Secondary | ICD-10-CM | POA: Diagnosis not present

## 2022-12-17 DIAGNOSIS — Z961 Presence of intraocular lens: Secondary | ICD-10-CM | POA: Diagnosis not present

## 2022-12-17 DIAGNOSIS — H52203 Unspecified astigmatism, bilateral: Secondary | ICD-10-CM | POA: Diagnosis not present

## 2022-12-23 DIAGNOSIS — J301 Allergic rhinitis due to pollen: Secondary | ICD-10-CM | POA: Diagnosis not present

## 2022-12-23 DIAGNOSIS — J3081 Allergic rhinitis due to animal (cat) (dog) hair and dander: Secondary | ICD-10-CM | POA: Diagnosis not present

## 2022-12-23 DIAGNOSIS — J3089 Other allergic rhinitis: Secondary | ICD-10-CM | POA: Diagnosis not present

## 2022-12-30 DIAGNOSIS — J3081 Allergic rhinitis due to animal (cat) (dog) hair and dander: Secondary | ICD-10-CM | POA: Diagnosis not present

## 2022-12-30 DIAGNOSIS — J3089 Other allergic rhinitis: Secondary | ICD-10-CM | POA: Diagnosis not present

## 2022-12-30 DIAGNOSIS — J301 Allergic rhinitis due to pollen: Secondary | ICD-10-CM | POA: Diagnosis not present

## 2023-01-01 DIAGNOSIS — L03115 Cellulitis of right lower limb: Secondary | ICD-10-CM | POA: Diagnosis not present

## 2023-01-13 DIAGNOSIS — J3081 Allergic rhinitis due to animal (cat) (dog) hair and dander: Secondary | ICD-10-CM | POA: Diagnosis not present

## 2023-01-13 DIAGNOSIS — J301 Allergic rhinitis due to pollen: Secondary | ICD-10-CM | POA: Diagnosis not present

## 2023-01-13 DIAGNOSIS — J3089 Other allergic rhinitis: Secondary | ICD-10-CM | POA: Diagnosis not present

## 2023-01-15 DIAGNOSIS — K638219 Small intestinal bacterial overgrowth, unspecified: Secondary | ICD-10-CM | POA: Diagnosis not present

## 2023-01-15 DIAGNOSIS — R140 Abdominal distension (gaseous): Secondary | ICD-10-CM | POA: Diagnosis not present

## 2023-01-27 DIAGNOSIS — J3089 Other allergic rhinitis: Secondary | ICD-10-CM | POA: Diagnosis not present

## 2023-01-27 DIAGNOSIS — M7062 Trochanteric bursitis, left hip: Secondary | ICD-10-CM | POA: Diagnosis not present

## 2023-01-27 DIAGNOSIS — J3081 Allergic rhinitis due to animal (cat) (dog) hair and dander: Secondary | ICD-10-CM | POA: Diagnosis not present

## 2023-01-27 DIAGNOSIS — J301 Allergic rhinitis due to pollen: Secondary | ICD-10-CM | POA: Diagnosis not present

## 2023-02-11 DIAGNOSIS — J3081 Allergic rhinitis due to animal (cat) (dog) hair and dander: Secondary | ICD-10-CM | POA: Diagnosis not present

## 2023-02-11 DIAGNOSIS — J301 Allergic rhinitis due to pollen: Secondary | ICD-10-CM | POA: Diagnosis not present

## 2023-02-11 DIAGNOSIS — J3089 Other allergic rhinitis: Secondary | ICD-10-CM | POA: Diagnosis not present

## 2023-03-03 DIAGNOSIS — J3089 Other allergic rhinitis: Secondary | ICD-10-CM | POA: Diagnosis not present

## 2023-03-03 DIAGNOSIS — J3081 Allergic rhinitis due to animal (cat) (dog) hair and dander: Secondary | ICD-10-CM | POA: Diagnosis not present

## 2023-03-03 DIAGNOSIS — J301 Allergic rhinitis due to pollen: Secondary | ICD-10-CM | POA: Diagnosis not present

## 2023-03-12 DIAGNOSIS — J3089 Other allergic rhinitis: Secondary | ICD-10-CM | POA: Diagnosis not present

## 2023-03-12 DIAGNOSIS — H1045 Other chronic allergic conjunctivitis: Secondary | ICD-10-CM | POA: Diagnosis not present

## 2023-03-12 DIAGNOSIS — J4530 Mild persistent asthma, uncomplicated: Secondary | ICD-10-CM | POA: Diagnosis not present

## 2023-03-12 DIAGNOSIS — J301 Allergic rhinitis due to pollen: Secondary | ICD-10-CM | POA: Diagnosis not present

## 2023-03-12 DIAGNOSIS — J3081 Allergic rhinitis due to animal (cat) (dog) hair and dander: Secondary | ICD-10-CM | POA: Diagnosis not present

## 2023-03-18 DIAGNOSIS — J301 Allergic rhinitis due to pollen: Secondary | ICD-10-CM | POA: Diagnosis not present

## 2023-03-18 DIAGNOSIS — J3081 Allergic rhinitis due to animal (cat) (dog) hair and dander: Secondary | ICD-10-CM | POA: Diagnosis not present

## 2023-03-18 DIAGNOSIS — J3089 Other allergic rhinitis: Secondary | ICD-10-CM | POA: Diagnosis not present

## 2023-03-23 DIAGNOSIS — K638219 Small intestinal bacterial overgrowth, unspecified: Secondary | ICD-10-CM | POA: Diagnosis not present

## 2023-03-23 DIAGNOSIS — R140 Abdominal distension (gaseous): Secondary | ICD-10-CM | POA: Diagnosis not present

## 2023-03-23 DIAGNOSIS — K5909 Other constipation: Secondary | ICD-10-CM | POA: Diagnosis not present

## 2023-03-31 DIAGNOSIS — J3081 Allergic rhinitis due to animal (cat) (dog) hair and dander: Secondary | ICD-10-CM | POA: Diagnosis not present

## 2023-03-31 DIAGNOSIS — J3089 Other allergic rhinitis: Secondary | ICD-10-CM | POA: Diagnosis not present

## 2023-03-31 DIAGNOSIS — J301 Allergic rhinitis due to pollen: Secondary | ICD-10-CM | POA: Diagnosis not present

## 2023-04-16 DIAGNOSIS — J3089 Other allergic rhinitis: Secondary | ICD-10-CM | POA: Diagnosis not present

## 2023-04-16 DIAGNOSIS — J301 Allergic rhinitis due to pollen: Secondary | ICD-10-CM | POA: Diagnosis not present

## 2023-04-16 DIAGNOSIS — J3081 Allergic rhinitis due to animal (cat) (dog) hair and dander: Secondary | ICD-10-CM | POA: Diagnosis not present

## 2023-04-17 DIAGNOSIS — M4325 Fusion of spine, thoracolumbar region: Secondary | ICD-10-CM | POA: Diagnosis not present

## 2023-04-17 DIAGNOSIS — K5909 Other constipation: Secondary | ICD-10-CM | POA: Diagnosis not present

## 2023-04-17 DIAGNOSIS — S32011G Stable burst fracture of first lumbar vertebra, subsequent encounter for fracture with delayed healing: Secondary | ICD-10-CM | POA: Diagnosis not present

## 2023-04-29 DIAGNOSIS — J3081 Allergic rhinitis due to animal (cat) (dog) hair and dander: Secondary | ICD-10-CM | POA: Diagnosis not present

## 2023-04-29 DIAGNOSIS — J301 Allergic rhinitis due to pollen: Secondary | ICD-10-CM | POA: Diagnosis not present

## 2023-04-29 DIAGNOSIS — J3089 Other allergic rhinitis: Secondary | ICD-10-CM | POA: Diagnosis not present

## 2023-05-01 ENCOUNTER — Ambulatory Visit (INDEPENDENT_AMBULATORY_CARE_PROVIDER_SITE_OTHER): Payer: Medicare Other | Admitting: Adult Health

## 2023-05-01 ENCOUNTER — Encounter: Payer: Self-pay | Admitting: Adult Health

## 2023-05-01 VITALS — BP 98/70 | HR 64 | Temp 97.5°F | Ht 63.0 in | Wt 109.0 lb

## 2023-05-01 DIAGNOSIS — E039 Hypothyroidism, unspecified: Secondary | ICD-10-CM

## 2023-05-01 DIAGNOSIS — M5459 Other low back pain: Secondary | ICD-10-CM | POA: Diagnosis not present

## 2023-05-01 DIAGNOSIS — M7602 Gluteal tendinitis, left hip: Secondary | ICD-10-CM | POA: Diagnosis not present

## 2023-05-01 NOTE — Progress Notes (Signed)
Subjective:    Patient ID: Jennifer Deleon, female    DOB: Apr 12, 1946, 77 y.o.   MRN: [id number]  Thyroid Problem    77 year old female who  has a past medical history of Arthritis, Asthma, Chicken pox, Colon polyps, Depression, Environmental allergies, Hypothyroid, Osteoporosis, and UTI (lower urinary tract infection).  She presents to the office today. She needs to have her thyroid level checked. She is taking Synthroid 112 mcg daily. She feels well controled.     Review of Systems See HPI   Past Medical History:  Diagnosis Date   Arthritis    Asthma    Chicken pox    Colon polyps    Depression    Environmental allergies    allergies all year long   Hypothyroid    Osteoporosis    UTI (lower urinary tract infection)     Social History   Socioeconomic History   Marital status: Married    Spouse name: Not on file   Number of children: 2   Years of education: Not on file   Highest education level: Bachelor's degree (e.g., BA, AB, BS)  Occupational History   Occupation: retired  Tobacco Use   Smoking status: Never   Smokeless tobacco: Never  Vaping Use   Vaping status: Never Used  Substance and Sexual Activity   Alcohol use: Yes    Alcohol/week: 7.0 standard drinks of alcohol    Types: 7 Glasses of wine per week    Comment: a glass of wine with dinner each night   Drug use: No   Sexual activity: Not on file  Other Topics Concern   Not on file  Social History Narrative   Retired from Museum/gallery exhibitions officer    Married for  11 years    Children who live in [place]      Social Determinants of Health   Financial Resource Strain: Low Risk  (04/30/2023)   Overall Financial Resource Strain (CARDIA)    Difficulty of Paying Living Expenses: Not hard at all  Food Insecurity: No Food Insecurity (04/30/2023)   Hunger Vital Sign    Worried About Running Out of Food in the Last Year: Never true    Ran Out of Food in the Last Year: Never true  Transportation Needs: No  Transportation Needs (04/30/2023)   PRAPARE - Administrator, Civil Service (Medical): No    Lack of Transportation (Non-Medical): No  Physical Activity: Sufficiently Active (04/30/2023)   Exercise Vital Sign    Days of Exercise per Week: 6 days    Minutes of Exercise per Session: 30 min  Stress: Stress Concern Present (04/30/2023)   [employer] of Occupational [employer] - Occupational Stress Questionnaire    Feeling of Stress : To some extent  Social Connections: Moderately Integrated (04/30/2023)   Social Connection and Isolation Panel [NHANES]    Frequency of Communication with Friends and Family: Twice a week    Frequency of Social Gatherings with Friends and Family: Once a week    Attends Religious Services: Never    Database administrator or Organizations: Yes    Attends Engineer, structural: More than 4 times per year    Marital Status: Married  Catering manager Violence: Not At Risk (03/05/2021)   Humiliation, Afraid, Rape, and Kick questionnaire    Fear of Current or Ex-Partner: No    Emotionally Abused: No    Physically Abused: No    Sexually Abused: No  Past Surgical History:  Procedure Laterality Date   CATARACT EXTRACTION Bilateral 2016   LAMINECTOMY     LUMBAR FUSION  2015   l4-L5   TUBAL LIGATION      Family History  Problem Relation Age of Onset   Osteoarthritis Mother    Osteoarthritis Father    Hypertension Father    Heart failure Father    Osteoarthritis Maternal Grandmother    Prostate cancer Paternal Grandfather        mets to colon   Colon cancer Paternal Grandfather    Arthritis Other    Prostate cancer Other    Mental illness Other    Breast cancer Paternal Aunt     Allergies  Allergen Reactions   Augmentin [Amoxicillin-Pot Clavulanate] Diarrhea and Other (See Comments)    Severe Stomach cramps   Bactrim [Sulfamethoxazole-Trimethoprim] Swelling    Facial and lip swelling    Current Outpatient Medications on  File Prior to Visit  Medication Sig Dispense Refill   buPROPion (WELLBUTRIN XL) 300 MG 24 hr tablet Take 300 mg by mouth daily.      cetirizine (ZYRTEC) 10 MG tablet Take 10 mg by mouth daily.      diphenhydrAMINE (BENADRYL) 25 MG tablet Take 25 mg by mouth every 6 (six) hours as needed for allergies.     DULoxetine (CYMBALTA) 60 MG capsule Take 60 mg by mouth daily.      EPINEPHrine 0.3 mg/0.3 mL IJ SOAJ injection INJECT INTO THE MIDDLE OF THE OUTER THIGH AND HOLD FOR 3 SECONDS AS NEEDED FOR SEVERE ALLERGIC REACTION THEN CALL 911 IF USED.     fexofenadine (ALLEGRA) 180 MG tablet Take 180 mg by mouth daily.      ipratropium (ATROVENT) 0.03 % nasal spray Place into both nostrils.     levothyroxine (SYNTHROID) 112 MCG tablet TAKE 1 TABLET DAILY 90 tablet 3   montelukast (SINGULAIR) 10 MG tablet Take 10 mg by mouth at bedtime.     Multiple Vitamin (MULTIVITAMIN WITH MINERALS) TABS Take 1 tablet by mouth daily.      No current facility-administered medications on file prior to visit.    BP 98/70   Pulse 64   Temp (!) 97.5 F (36.4 C) (Oral)   Ht 5\' 3"  (1.6 m)   Wt 109 lb (49.4 kg)   SpO2 94%   BMI 19.31 kg/m       Objective:   Physical Exam Vitals and nursing note reviewed.  Constitutional:      Appearance: Normal appearance.  Cardiovascular:     Rate and Rhythm: Normal rate and regular rhythm.     Pulses: Normal pulses.     Heart sounds: Normal heart sounds.  Pulmonary:     Effort: Pulmonary effort is normal.     Breath sounds: Normal breath sounds.  Musculoskeletal:        General: Normal range of motion.  Skin:    General: Skin is warm and dry.  Neurological:     General: No focal deficit present.     Mental Status: She is alert and oriented to person, place, and time.  Psychiatric:        Mood and Affect: Mood normal.        Behavior: Behavior normal.        Thought Content: Thought content normal.        Judgment: Judgment normal.       Assessment & Plan:    1. Hypothyroidism, unspecified type - Consider dose  change  - TSH; Future - T3, Free; Future - T4, Free; Future   Shirline Frees, NP

## 2023-05-01 NOTE — Addendum Note (Signed)
Addended by: Trellis Paganini D on: 05/01/2023 02:55 PM   Modules accepted: Orders

## 2023-05-02 LAB — T3, FREE: T3, Free: 2.3 pg/mL (ref 2.3–4.2)

## 2023-05-02 LAB — T4, FREE: Free T4: 1 ng/dL (ref 0.8–1.8)

## 2023-05-02 LAB — TSH: TSH: 4.15 m[IU]/L (ref 0.40–4.50)

## 2023-05-04 DIAGNOSIS — M461 Sacroiliitis, not elsewhere classified: Secondary | ICD-10-CM | POA: Diagnosis not present

## 2023-05-04 DIAGNOSIS — M7602 Gluteal tendinitis, left hip: Secondary | ICD-10-CM | POA: Diagnosis not present

## 2023-05-04 DIAGNOSIS — M5459 Other low back pain: Secondary | ICD-10-CM | POA: Diagnosis not present

## 2023-05-04 DIAGNOSIS — M4326 Fusion of spine, lumbar region: Secondary | ICD-10-CM | POA: Diagnosis not present

## 2023-05-05 ENCOUNTER — Other Ambulatory Visit: Payer: Self-pay | Admitting: Adult Health

## 2023-05-05 MED ORDER — LEVOTHYROXINE SODIUM 112 MCG PO TABS: 112.0000 ug | ORAL_TABLET | Freq: Every day | ORAL | 3 refills | Status: DC

## 2023-05-06 DIAGNOSIS — M5459 Other low back pain: Secondary | ICD-10-CM | POA: Diagnosis not present

## 2023-05-06 DIAGNOSIS — M7602 Gluteal tendinitis, left hip: Secondary | ICD-10-CM | POA: Diagnosis not present

## 2023-05-11 DIAGNOSIS — M7602 Gluteal tendinitis, left hip: Secondary | ICD-10-CM | POA: Diagnosis not present

## 2023-05-11 DIAGNOSIS — M5459 Other low back pain: Secondary | ICD-10-CM | POA: Diagnosis not present

## 2023-05-13 DIAGNOSIS — J301 Allergic rhinitis due to pollen: Secondary | ICD-10-CM | POA: Diagnosis not present

## 2023-05-13 DIAGNOSIS — J3089 Other allergic rhinitis: Secondary | ICD-10-CM | POA: Diagnosis not present

## 2023-05-13 DIAGNOSIS — M7602 Gluteal tendinitis, left hip: Secondary | ICD-10-CM | POA: Diagnosis not present

## 2023-05-13 DIAGNOSIS — M5459 Other low back pain: Secondary | ICD-10-CM | POA: Diagnosis not present

## 2023-05-13 DIAGNOSIS — J3081 Allergic rhinitis due to animal (cat) (dog) hair and dander: Secondary | ICD-10-CM | POA: Diagnosis not present

## 2023-05-18 DIAGNOSIS — M7602 Gluteal tendinitis, left hip: Secondary | ICD-10-CM | POA: Diagnosis not present

## 2023-05-18 DIAGNOSIS — M5459 Other low back pain: Secondary | ICD-10-CM | POA: Diagnosis not present

## 2023-05-20 DIAGNOSIS — M5459 Other low back pain: Secondary | ICD-10-CM | POA: Diagnosis not present

## 2023-05-20 DIAGNOSIS — M7602 Gluteal tendinitis, left hip: Secondary | ICD-10-CM | POA: Diagnosis not present

## 2023-05-25 DIAGNOSIS — M5459 Other low back pain: Secondary | ICD-10-CM | POA: Diagnosis not present

## 2023-05-25 DIAGNOSIS — M7602 Gluteal tendinitis, left hip: Secondary | ICD-10-CM | POA: Diagnosis not present

## 2023-05-27 DIAGNOSIS — M5459 Other low back pain: Secondary | ICD-10-CM | POA: Diagnosis not present

## 2023-05-27 DIAGNOSIS — M7602 Gluteal tendinitis, left hip: Secondary | ICD-10-CM | POA: Diagnosis not present

## 2023-05-28 DIAGNOSIS — J3089 Other allergic rhinitis: Secondary | ICD-10-CM | POA: Diagnosis not present

## 2023-05-28 DIAGNOSIS — J301 Allergic rhinitis due to pollen: Secondary | ICD-10-CM | POA: Diagnosis not present

## 2023-05-28 DIAGNOSIS — J3081 Allergic rhinitis due to animal (cat) (dog) hair and dander: Secondary | ICD-10-CM | POA: Diagnosis not present

## 2023-05-29 DIAGNOSIS — J3089 Other allergic rhinitis: Secondary | ICD-10-CM | POA: Diagnosis not present

## 2023-05-29 DIAGNOSIS — K638219 Small intestinal bacterial overgrowth, unspecified: Secondary | ICD-10-CM | POA: Diagnosis not present

## 2023-05-29 DIAGNOSIS — J3081 Allergic rhinitis due to animal (cat) (dog) hair and dander: Secondary | ICD-10-CM | POA: Diagnosis not present

## 2023-05-29 DIAGNOSIS — R109 Unspecified abdominal pain: Secondary | ICD-10-CM | POA: Diagnosis not present

## 2023-05-29 DIAGNOSIS — R140 Abdominal distension (gaseous): Secondary | ICD-10-CM | POA: Diagnosis not present

## 2023-05-29 DIAGNOSIS — K5909 Other constipation: Secondary | ICD-10-CM | POA: Diagnosis not present

## 2023-05-29 DIAGNOSIS — J301 Allergic rhinitis due to pollen: Secondary | ICD-10-CM | POA: Diagnosis not present

## 2023-06-01 DIAGNOSIS — M7602 Gluteal tendinitis, left hip: Secondary | ICD-10-CM | POA: Diagnosis not present

## 2023-06-01 DIAGNOSIS — M5459 Other low back pain: Secondary | ICD-10-CM | POA: Diagnosis not present

## 2023-06-08 DIAGNOSIS — M7602 Gluteal tendinitis, left hip: Secondary | ICD-10-CM | POA: Diagnosis not present

## 2023-06-08 DIAGNOSIS — M5459 Other low back pain: Secondary | ICD-10-CM | POA: Diagnosis not present

## 2023-06-11 DIAGNOSIS — M7602 Gluteal tendinitis, left hip: Secondary | ICD-10-CM | POA: Diagnosis not present

## 2023-06-11 DIAGNOSIS — J3081 Allergic rhinitis due to animal (cat) (dog) hair and dander: Secondary | ICD-10-CM | POA: Diagnosis not present

## 2023-06-11 DIAGNOSIS — J301 Allergic rhinitis due to pollen: Secondary | ICD-10-CM | POA: Diagnosis not present

## 2023-06-11 DIAGNOSIS — M5459 Other low back pain: Secondary | ICD-10-CM | POA: Diagnosis not present

## 2023-06-11 DIAGNOSIS — J3089 Other allergic rhinitis: Secondary | ICD-10-CM | POA: Diagnosis not present

## 2023-06-17 ENCOUNTER — Other Ambulatory Visit: Payer: Self-pay | Admitting: Student in an Organized Health Care Education/Training Program

## 2023-06-17 DIAGNOSIS — M4326 Fusion of spine, lumbar region: Secondary | ICD-10-CM | POA: Diagnosis not present

## 2023-06-25 DIAGNOSIS — J3081 Allergic rhinitis due to animal (cat) (dog) hair and dander: Secondary | ICD-10-CM | POA: Diagnosis not present

## 2023-06-25 DIAGNOSIS — J3089 Other allergic rhinitis: Secondary | ICD-10-CM | POA: Diagnosis not present

## 2023-06-25 DIAGNOSIS — J301 Allergic rhinitis due to pollen: Secondary | ICD-10-CM | POA: Diagnosis not present

## 2023-07-10 ENCOUNTER — Ambulatory Visit
Admission: RE | Admit: 2023-07-10 | Discharge: 2023-07-10 | Disposition: A | Discharge status: Home / Self Care | Payer: Medicare Other | Source: Ambulatory Visit | Attending: Student in an Organized Health Care Education/Training Program | Admitting: Student in an Organized Health Care Education/Training Program

## 2023-07-10 DIAGNOSIS — M5126 Other intervertebral disc displacement, lumbar region: Secondary | ICD-10-CM | POA: Diagnosis not present

## 2023-07-10 DIAGNOSIS — J3089 Other allergic rhinitis: Secondary | ICD-10-CM | POA: Diagnosis not present

## 2023-07-10 DIAGNOSIS — Z981 Arthrodesis status: Secondary | ICD-10-CM | POA: Diagnosis not present

## 2023-07-10 DIAGNOSIS — M47816 Spondylosis without myelopathy or radiculopathy, lumbar region: Secondary | ICD-10-CM | POA: Diagnosis not present

## 2023-07-10 DIAGNOSIS — J3081 Allergic rhinitis due to animal (cat) (dog) hair and dander: Secondary | ICD-10-CM | POA: Diagnosis not present

## 2023-07-10 DIAGNOSIS — M4326 Fusion of spine, lumbar region: Secondary | ICD-10-CM

## 2023-07-10 DIAGNOSIS — J301 Allergic rhinitis due to pollen: Secondary | ICD-10-CM | POA: Diagnosis not present

## 2023-07-15 DIAGNOSIS — J3089 Other allergic rhinitis: Secondary | ICD-10-CM | POA: Diagnosis not present

## 2023-07-15 DIAGNOSIS — J3081 Allergic rhinitis due to animal (cat) (dog) hair and dander: Secondary | ICD-10-CM | POA: Diagnosis not present

## 2023-07-15 DIAGNOSIS — J301 Allergic rhinitis due to pollen: Secondary | ICD-10-CM | POA: Diagnosis not present

## 2023-07-17 DIAGNOSIS — M5432 Sciatica, left side: Secondary | ICD-10-CM | POA: Diagnosis not present

## 2023-07-17 DIAGNOSIS — M4326 Fusion of spine, lumbar region: Secondary | ICD-10-CM | POA: Diagnosis not present

## 2023-07-22 DIAGNOSIS — J3089 Other allergic rhinitis: Secondary | ICD-10-CM | POA: Diagnosis not present

## 2023-07-22 DIAGNOSIS — J3081 Allergic rhinitis due to animal (cat) (dog) hair and dander: Secondary | ICD-10-CM | POA: Diagnosis not present

## 2023-07-22 DIAGNOSIS — J301 Allergic rhinitis due to pollen: Secondary | ICD-10-CM | POA: Diagnosis not present

## 2023-08-06 DIAGNOSIS — J3089 Other allergic rhinitis: Secondary | ICD-10-CM | POA: Diagnosis not present

## 2023-08-06 DIAGNOSIS — J301 Allergic rhinitis due to pollen: Secondary | ICD-10-CM | POA: Diagnosis not present

## 2023-08-06 DIAGNOSIS — J3081 Allergic rhinitis due to animal (cat) (dog) hair and dander: Secondary | ICD-10-CM | POA: Diagnosis not present

## 2023-08-10 DIAGNOSIS — M5416 Radiculopathy, lumbar region: Secondary | ICD-10-CM | POA: Diagnosis not present

## 2023-08-11 DIAGNOSIS — J3081 Allergic rhinitis due to animal (cat) (dog) hair and dander: Secondary | ICD-10-CM | POA: Diagnosis not present

## 2023-08-11 DIAGNOSIS — J301 Allergic rhinitis due to pollen: Secondary | ICD-10-CM | POA: Diagnosis not present

## 2023-08-11 DIAGNOSIS — J3089 Other allergic rhinitis: Secondary | ICD-10-CM | POA: Diagnosis not present

## 2023-08-12 ENCOUNTER — Other Ambulatory Visit (HOSPITAL_COMMUNITY): Payer: Self-pay | Admitting: Internal Medicine

## 2023-08-12 DIAGNOSIS — R6881 Early satiety: Secondary | ICD-10-CM | POA: Diagnosis not present

## 2023-08-12 DIAGNOSIS — R140 Abdominal distension (gaseous): Secondary | ICD-10-CM

## 2023-08-12 DIAGNOSIS — K5909 Other constipation: Secondary | ICD-10-CM | POA: Diagnosis not present

## 2023-08-12 DIAGNOSIS — K638219 Small intestinal bacterial overgrowth, unspecified: Secondary | ICD-10-CM | POA: Diagnosis not present

## 2023-08-18 DIAGNOSIS — J3089 Other allergic rhinitis: Secondary | ICD-10-CM | POA: Diagnosis not present

## 2023-08-18 DIAGNOSIS — J3081 Allergic rhinitis due to animal (cat) (dog) hair and dander: Secondary | ICD-10-CM | POA: Diagnosis not present

## 2023-08-18 DIAGNOSIS — J301 Allergic rhinitis due to pollen: Secondary | ICD-10-CM | POA: Diagnosis not present

## 2023-08-26 ENCOUNTER — Encounter (HOSPITAL_COMMUNITY)
Admission: RE | Admit: 2023-08-26 | Discharge: 2023-08-26 | Disposition: A | Discharge status: Home / Self Care | Payer: Medicare Other | Source: Ambulatory Visit | Attending: Internal Medicine | Admitting: Internal Medicine

## 2023-08-26 DIAGNOSIS — R140 Abdominal distension (gaseous): Secondary | ICD-10-CM | POA: Insufficient documentation

## 2023-08-26 DIAGNOSIS — R6881 Early satiety: Secondary | ICD-10-CM | POA: Diagnosis not present

## 2023-08-26 MED ORDER — TECHNETIUM TC 99M SULFUR COLLOID
2.0000 | Freq: Once | INTRAVENOUS | Status: AC
  Administered 2023-08-26: 2 via ORAL

## 2023-09-04 DIAGNOSIS — J3089 Other allergic rhinitis: Secondary | ICD-10-CM | POA: Diagnosis not present

## 2023-09-04 DIAGNOSIS — J301 Allergic rhinitis due to pollen: Secondary | ICD-10-CM | POA: Diagnosis not present

## 2023-09-04 DIAGNOSIS — J3081 Allergic rhinitis due to animal (cat) (dog) hair and dander: Secondary | ICD-10-CM | POA: Diagnosis not present

## 2023-09-08 DIAGNOSIS — M5416 Radiculopathy, lumbar region: Secondary | ICD-10-CM | POA: Diagnosis not present

## 2023-09-08 DIAGNOSIS — M4326 Fusion of spine, lumbar region: Secondary | ICD-10-CM | POA: Diagnosis not present

## 2023-09-18 DIAGNOSIS — J3089 Other allergic rhinitis: Secondary | ICD-10-CM | POA: Diagnosis not present

## 2023-09-18 DIAGNOSIS — J3081 Allergic rhinitis due to animal (cat) (dog) hair and dander: Secondary | ICD-10-CM | POA: Diagnosis not present

## 2023-09-18 DIAGNOSIS — J301 Allergic rhinitis due to pollen: Secondary | ICD-10-CM | POA: Diagnosis not present

## 2023-10-02 DIAGNOSIS — J3081 Allergic rhinitis due to animal (cat) (dog) hair and dander: Secondary | ICD-10-CM | POA: Diagnosis not present

## 2023-10-02 DIAGNOSIS — J301 Allergic rhinitis due to pollen: Secondary | ICD-10-CM | POA: Diagnosis not present

## 2023-10-02 DIAGNOSIS — J3089 Other allergic rhinitis: Secondary | ICD-10-CM | POA: Diagnosis not present

## 2023-10-08 DIAGNOSIS — M6281 Muscle weakness (generalized): Secondary | ICD-10-CM | POA: Diagnosis not present

## 2023-10-08 DIAGNOSIS — M5459 Other low back pain: Secondary | ICD-10-CM | POA: Diagnosis not present

## 2023-10-13 DIAGNOSIS — M5459 Other low back pain: Secondary | ICD-10-CM | POA: Diagnosis not present

## 2023-10-13 DIAGNOSIS — M6281 Muscle weakness (generalized): Secondary | ICD-10-CM | POA: Diagnosis not present

## 2023-10-15 DIAGNOSIS — J3089 Other allergic rhinitis: Secondary | ICD-10-CM | POA: Diagnosis not present

## 2023-10-15 DIAGNOSIS — J301 Allergic rhinitis due to pollen: Secondary | ICD-10-CM | POA: Diagnosis not present

## 2023-10-15 DIAGNOSIS — M5459 Other low back pain: Secondary | ICD-10-CM | POA: Diagnosis not present

## 2023-10-15 DIAGNOSIS — J3081 Allergic rhinitis due to animal (cat) (dog) hair and dander: Secondary | ICD-10-CM | POA: Diagnosis not present

## 2023-10-15 DIAGNOSIS — M6281 Muscle weakness (generalized): Secondary | ICD-10-CM | POA: Diagnosis not present

## 2023-10-20 DIAGNOSIS — M5416 Radiculopathy, lumbar region: Secondary | ICD-10-CM | POA: Diagnosis not present

## 2023-10-20 DIAGNOSIS — M6281 Muscle weakness (generalized): Secondary | ICD-10-CM | POA: Diagnosis not present

## 2023-10-20 DIAGNOSIS — M5459 Other low back pain: Secondary | ICD-10-CM | POA: Diagnosis not present

## 2023-10-23 DIAGNOSIS — M6281 Muscle weakness (generalized): Secondary | ICD-10-CM | POA: Diagnosis not present

## 2023-10-23 DIAGNOSIS — M5459 Other low back pain: Secondary | ICD-10-CM | POA: Diagnosis not present

## 2023-10-26 DIAGNOSIS — M5459 Other low back pain: Secondary | ICD-10-CM | POA: Diagnosis not present

## 2023-10-26 DIAGNOSIS — M6281 Muscle weakness (generalized): Secondary | ICD-10-CM | POA: Diagnosis not present

## 2023-10-28 DIAGNOSIS — J301 Allergic rhinitis due to pollen: Secondary | ICD-10-CM | POA: Diagnosis not present

## 2023-10-28 DIAGNOSIS — J3081 Allergic rhinitis due to animal (cat) (dog) hair and dander: Secondary | ICD-10-CM | POA: Diagnosis not present

## 2023-10-28 DIAGNOSIS — J3089 Other allergic rhinitis: Secondary | ICD-10-CM | POA: Diagnosis not present

## 2023-10-29 DIAGNOSIS — M5459 Other low back pain: Secondary | ICD-10-CM | POA: Diagnosis not present

## 2023-10-29 DIAGNOSIS — M6281 Muscle weakness (generalized): Secondary | ICD-10-CM | POA: Diagnosis not present

## 2023-11-05 DIAGNOSIS — J40 Bronchitis, not specified as acute or chronic: Secondary | ICD-10-CM | POA: Diagnosis not present

## 2023-11-05 DIAGNOSIS — R059 Cough, unspecified: Secondary | ICD-10-CM | POA: Diagnosis not present

## 2023-11-05 DIAGNOSIS — M5459 Other low back pain: Secondary | ICD-10-CM | POA: Diagnosis not present

## 2023-11-05 DIAGNOSIS — M6281 Muscle weakness (generalized): Secondary | ICD-10-CM | POA: Diagnosis not present

## 2023-11-06 ENCOUNTER — Ambulatory Visit: Admitting: Adult Health

## 2023-11-11 DIAGNOSIS — M5459 Other low back pain: Secondary | ICD-10-CM | POA: Diagnosis not present

## 2023-11-11 DIAGNOSIS — M6281 Muscle weakness (generalized): Secondary | ICD-10-CM | POA: Diagnosis not present

## 2023-11-16 DIAGNOSIS — M5459 Other low back pain: Secondary | ICD-10-CM | POA: Diagnosis not present

## 2023-11-16 DIAGNOSIS — M6281 Muscle weakness (generalized): Secondary | ICD-10-CM | POA: Diagnosis not present

## 2023-11-18 DIAGNOSIS — J301 Allergic rhinitis due to pollen: Secondary | ICD-10-CM | POA: Diagnosis not present

## 2023-11-18 DIAGNOSIS — J3089 Other allergic rhinitis: Secondary | ICD-10-CM | POA: Diagnosis not present

## 2023-11-18 DIAGNOSIS — J3081 Allergic rhinitis due to animal (cat) (dog) hair and dander: Secondary | ICD-10-CM | POA: Diagnosis not present

## 2023-11-22 DIAGNOSIS — R052 Subacute cough: Secondary | ICD-10-CM | POA: Diagnosis not present

## 2023-11-22 DIAGNOSIS — J4531 Mild persistent asthma with (acute) exacerbation: Secondary | ICD-10-CM | POA: Diagnosis not present

## 2023-11-24 ENCOUNTER — Ambulatory Visit: Admitting: Adult Health

## 2023-11-26 DIAGNOSIS — M6281 Muscle weakness (generalized): Secondary | ICD-10-CM | POA: Diagnosis not present

## 2023-11-26 DIAGNOSIS — M5459 Other low back pain: Secondary | ICD-10-CM | POA: Diagnosis not present

## 2023-12-01 DIAGNOSIS — M5416 Radiculopathy, lumbar region: Secondary | ICD-10-CM | POA: Diagnosis not present

## 2023-12-01 DIAGNOSIS — Z1330 Encounter for screening examination for mental health and behavioral disorders, unspecified: Secondary | ICD-10-CM | POA: Diagnosis not present

## 2023-12-01 DIAGNOSIS — Z981 Arthrodesis status: Secondary | ICD-10-CM | POA: Diagnosis not present

## 2023-12-02 DIAGNOSIS — M5459 Other low back pain: Secondary | ICD-10-CM | POA: Diagnosis not present

## 2023-12-02 DIAGNOSIS — M6281 Muscle weakness (generalized): Secondary | ICD-10-CM | POA: Diagnosis not present

## 2023-12-03 DIAGNOSIS — J4530 Mild persistent asthma, uncomplicated: Secondary | ICD-10-CM | POA: Diagnosis not present

## 2023-12-03 DIAGNOSIS — J3089 Other allergic rhinitis: Secondary | ICD-10-CM | POA: Diagnosis not present

## 2023-12-03 DIAGNOSIS — J301 Allergic rhinitis due to pollen: Secondary | ICD-10-CM | POA: Diagnosis not present

## 2023-12-03 DIAGNOSIS — J3081 Allergic rhinitis due to animal (cat) (dog) hair and dander: Secondary | ICD-10-CM | POA: Diagnosis not present

## 2023-12-10 DIAGNOSIS — M6281 Muscle weakness (generalized): Secondary | ICD-10-CM | POA: Diagnosis not present

## 2023-12-10 DIAGNOSIS — M5459 Other low back pain: Secondary | ICD-10-CM | POA: Diagnosis not present

## 2023-12-15 DIAGNOSIS — M6281 Muscle weakness (generalized): Secondary | ICD-10-CM | POA: Diagnosis not present

## 2023-12-15 DIAGNOSIS — M5459 Other low back pain: Secondary | ICD-10-CM | POA: Diagnosis not present

## 2023-12-22 DIAGNOSIS — M5459 Other low back pain: Secondary | ICD-10-CM | POA: Diagnosis not present

## 2023-12-22 DIAGNOSIS — M6281 Muscle weakness (generalized): Secondary | ICD-10-CM | POA: Diagnosis not present

## 2023-12-24 DIAGNOSIS — R053 Chronic cough: Secondary | ICD-10-CM | POA: Diagnosis not present

## 2023-12-24 DIAGNOSIS — J849 Interstitial pulmonary disease, unspecified: Secondary | ICD-10-CM | POA: Diagnosis not present

## 2024-01-04 DIAGNOSIS — M5459 Other low back pain: Secondary | ICD-10-CM | POA: Diagnosis not present

## 2024-01-04 DIAGNOSIS — M6281 Muscle weakness (generalized): Secondary | ICD-10-CM | POA: Diagnosis not present

## 2024-01-05 DIAGNOSIS — H52203 Unspecified astigmatism, bilateral: Secondary | ICD-10-CM | POA: Diagnosis not present

## 2024-01-05 DIAGNOSIS — Z961 Presence of intraocular lens: Secondary | ICD-10-CM | POA: Diagnosis not present

## 2024-01-06 DIAGNOSIS — J849 Interstitial pulmonary disease, unspecified: Secondary | ICD-10-CM | POA: Diagnosis not present

## 2024-01-07 DIAGNOSIS — J3089 Other allergic rhinitis: Secondary | ICD-10-CM | POA: Diagnosis not present

## 2024-01-07 DIAGNOSIS — J3081 Allergic rhinitis due to animal (cat) (dog) hair and dander: Secondary | ICD-10-CM | POA: Diagnosis not present

## 2024-01-07 DIAGNOSIS — J301 Allergic rhinitis due to pollen: Secondary | ICD-10-CM | POA: Diagnosis not present

## 2024-01-07 DIAGNOSIS — J4530 Mild persistent asthma, uncomplicated: Secondary | ICD-10-CM | POA: Diagnosis not present

## 2024-01-13 DIAGNOSIS — J301 Allergic rhinitis due to pollen: Secondary | ICD-10-CM | POA: Diagnosis not present

## 2024-01-13 DIAGNOSIS — J3081 Allergic rhinitis due to animal (cat) (dog) hair and dander: Secondary | ICD-10-CM | POA: Diagnosis not present

## 2024-01-13 DIAGNOSIS — J3089 Other allergic rhinitis: Secondary | ICD-10-CM | POA: Diagnosis not present

## 2024-01-19 ENCOUNTER — Ambulatory Visit (INDEPENDENT_AMBULATORY_CARE_PROVIDER_SITE_OTHER): Admitting: Adult Health

## 2024-01-19 ENCOUNTER — Encounter: Payer: Self-pay | Admitting: Adult Health

## 2024-01-19 VITALS — BP 100/70 | HR 68 | Temp 98.4°F | Ht 63.0 in | Wt 115.0 lb

## 2024-01-19 DIAGNOSIS — F339 Major depressive disorder, recurrent, unspecified: Secondary | ICD-10-CM | POA: Diagnosis not present

## 2024-01-19 DIAGNOSIS — G8929 Other chronic pain: Secondary | ICD-10-CM

## 2024-01-19 DIAGNOSIS — E039 Hypothyroidism, unspecified: Secondary | ICD-10-CM

## 2024-01-19 DIAGNOSIS — J302 Other seasonal allergic rhinitis: Secondary | ICD-10-CM

## 2024-01-19 DIAGNOSIS — Z0000 Encounter for general adult medical examination without abnormal findings: Secondary | ICD-10-CM

## 2024-01-19 LAB — CBC WITH DIFFERENTIAL/PLATELET
Basophils Absolute: 0.1 K/uL (ref 0.0–0.1)
Basophils Relative: 1.4 % (ref 0.0–3.0)
Eosinophils Absolute: 0.3 K/uL (ref 0.0–0.7)
Eosinophils Relative: 5.7 % — ABNORMAL HIGH (ref 0.0–5.0)
HCT: 40.1 % (ref 36.0–46.0)
Hemoglobin: 13.1 g/dL (ref 12.0–15.0)
Lymphocytes Relative: 32.6 % (ref 12.0–46.0)
Lymphs Abs: 1.5 K/uL (ref 0.7–4.0)
MCHC: 32.8 g/dL (ref 30.0–36.0)
MCV: 97 fl (ref 78.0–100.0)
Monocytes Absolute: 0.6 K/uL (ref 0.1–1.0)
Monocytes Relative: 13.7 % — ABNORMAL HIGH (ref 3.0–12.0)
Neutro Abs: 2.1 K/uL (ref 1.4–7.7)
Neutrophils Relative %: 46.6 % (ref 43.0–77.0)
Platelets: 259 K/uL (ref 150.0–400.0)
RBC: 4.14 Mil/uL (ref 3.87–5.11)
RDW: 14.4 % (ref 11.5–15.5)
WBC: 4.6 K/uL (ref 4.0–10.5)

## 2024-01-19 LAB — COMPREHENSIVE METABOLIC PANEL WITH GFR
ALT: 14 U/L (ref 0–35)
AST: 20 U/L (ref 0–37)
Albumin: 4.4 g/dL (ref 3.5–5.2)
Alkaline Phosphatase: 57 U/L (ref 39–117)
BUN: 16 mg/dL (ref 6–23)
CO2: 30 meq/L (ref 19–32)
Calcium: 9.8 mg/dL (ref 8.4–10.5)
Chloride: 103 meq/L (ref 96–112)
Creatinine, Ser: 0.76 mg/dL (ref 0.40–1.20)
GFR: 74.43 mL/min (ref >60.00)
Glucose, Bld: 76 mg/dL (ref 70–99)
Potassium: 4.1 meq/L (ref 3.5–5.1)
Sodium: 139 meq/L (ref 135–145)
Total Bilirubin: 0.5 mg/dL (ref 0.2–1.2)
Total Protein: 6.6 g/dL (ref 6.0–8.3)

## 2024-01-19 LAB — LIPID PANEL
Cholesterol: 227 mg/dL — ABNORMAL HIGH (ref 0–200)
HDL: 86.3 mg/dL (ref >39.00)
LDL Cholesterol: 122 mg/dL — ABNORMAL HIGH (ref 0–99)
NonHDL: 141.14
Total CHOL/HDL Ratio: 3
Triglycerides: 96 mg/dL (ref 0.0–149.0)
VLDL: 19.2 mg/dL (ref 0.0–40.0)

## 2024-01-19 LAB — TSH: TSH: 9.64 u[IU]/mL — ABNORMAL HIGH (ref 0.35–5.50)

## 2024-01-19 NOTE — Progress Notes (Addendum)
 Subjective:    Patient ID: Jennifer Deleon, female    DOB: 07/23/1945, 78 y.o.   MRN: [id number]  HPI Patient presents for yearly chronic disease follow up.  She is a pleasant 78 year old female who  has a past medical history of Arthritis, Asthma, Chicken pox, Colon polyps, Depression, Environmental allergies, Hypothyroid, Osteoporosis, and UTI (lower urinary tract infection).  Hypothyroidism-takes Synthroid  112 mcg daily.  She does feel well controlled on this medication. Lab Results  Component Value Date   TSH 4.15 05/01/2023   Chronic seasonal allergies-is managed by allergy and asthma.  She does receive allergy shots.  Feels well controlled on her current regimen   Chronic Low Back Pain -she is followed by orthopedics, [facility].  Currently prescribed Lyrica  50 mg twice daily.  She is also receiving dural steroid injections periodically.  Notes pain pretty much every day, worse with certain activities such as gardening where she is doing a lot of bending and twisting.  Depression - managed by psychiatry with Wellbutrin  300 mg XR. She does report feeling depressed  All immunizations and health maintenance protocols were reviewed with the patient and needed orders were placed.  Appropriate screening laboratory values were ordered for the patient including screening of hyperlipidemia, renal function and hepatic function.  Medication reconciliation,  past medical history, social history, problem list and allergies were reviewed in detail with the patient  Goals were established with regard to weight loss, exercise, and  diet in compliance with medications Wt Readings from Last 3 Encounters:  01/19/24 115 lb (52.2 kg)  05/01/23 109 lb (49.4 kg)  10/14/22 108 lb (49 kg)    Review of Systems  Constitutional:  Positive for fatigue.  HENT:  Positive for rhinorrhea.   Eyes: Negative.   Respiratory: Negative.    Cardiovascular: Negative.   Gastrointestinal:  Positive for  constipation.  Endocrine: Negative.   Genitourinary: Negative.   Musculoskeletal:  Positive for arthralgias and back pain.  Skin: Negative.   Allergic/Immunologic: Negative.   Neurological: Negative.   Hematological: Negative.   Psychiatric/Behavioral:  Positive for dysphoric mood.    Past Medical History:  Diagnosis Date   Arthritis    Asthma    Chicken pox    Colon polyps    Depression    Environmental allergies    allergies all year long   Hypothyroid    Osteoporosis    UTI (lower urinary tract infection)     Social History   Socioeconomic History   Marital status: Married    Spouse name: Not on file   Number of children: 2   Years of education: Not on file   Highest education level: Bachelor's degree (e.g., BA, AB, BS)  Occupational History   Occupation: retired  Tobacco Use   Smoking status: Never   Smokeless tobacco: Never  Vaping Use   Vaping status: Never Used  Substance and Sexual Activity   Alcohol use: Yes    Alcohol/week: 7.0 standard drinks of alcohol    Types: 7 Glasses of wine per week    Comment: a glass of wine with dinner each night   Drug use: No   Sexual activity: Not on file  Other Topics Concern   Not on file  Social History Narrative   Retired from Museum/gallery exhibitions officer    Married for  11 years    Children who live in [place]       Social Drivers of Health   Financial Resource Strain: Low Risk  (  01/18/2024)   Overall Financial Resource Strain (CARDIA)    Difficulty of Paying Living Expenses: Not hard at all  Food Insecurity: No Food Insecurity (01/18/2024)   Hunger Vital Sign    Worried About Running Out of Food in the Last Year: Never true    Ran Out of Food in the Last Year: Never true  Transportation Needs: No Transportation Needs (01/18/2024)   PRAPARE - Administrator, Civil Service (Medical): No    Lack of Transportation (Non-Medical): No  Physical Activity: Sufficiently Active (01/18/2024)   Exercise Vital Sign     Days of Exercise per Week: 6 days    Minutes of Exercise per Session: 30 min  Recent Concern: Physical Activity - Insufficiently Active (11/05/2023)   Exercise Vital Sign    Days of Exercise per Week: 3 days    Minutes of Exercise per Session: 30 min  Stress: No Stress Concern Present (01/18/2024)   [employer] of Occupational Health - Occupational Stress Questionnaire    Feeling of Stress: Only a little  Recent Concern: Stress - Stress Concern Present (11/05/2023)   [employer] of Occupational Health - Occupational Stress Questionnaire    Feeling of Stress : To some extent  Social Connections: Socially Integrated (01/18/2024)   Social Connection and Isolation Panel    Frequency of Communication with Friends and Family: Twice a week    Frequency of Social Gatherings with Friends and Family: Once a week    Attends Religious Services: More than 4 times per year    Active Member of [employer] or Organizations: Yes    Attends Engineer, structural: More than 4 times per year    Marital Status: Married  Recent Concern: Social Connections - Moderately Isolated (11/05/2023)   Social Connection and Isolation Panel    Frequency of Communication with Friends and Family: Once a week    Frequency of Social Gatherings with Friends and Family: Once a week    Attends Religious Services: Never    Database administrator or Organizations: Yes    Attends Engineer, structural: More than 4 times per year    Marital Status: Married  Catering manager Violence: Not At Risk (03/05/2021)   Humiliation, Afraid, Rape, and Kick questionnaire    Fear of Current or Ex-Partner: No    Emotionally Abused: No    Physically Abused: No    Sexually Abused: No    Past Surgical History:  Procedure Laterality Date   CATARACT EXTRACTION Bilateral 2016   LAMINECTOMY     LUMBAR FUSION  2015   l4-L5   TUBAL LIGATION      Family History  Problem Relation Age of Onset   Osteoarthritis Mother     Osteoarthritis Father    Hypertension Father    Heart failure Father    Osteoarthritis Maternal Grandmother    Prostate cancer Paternal Grandfather        mets to colon   Colon cancer Paternal Grandfather    Arthritis Other    Prostate cancer Other    Mental illness Other    Breast cancer Paternal Aunt     Allergies  Allergen Reactions   Augmentin [Amoxicillin-Pot Clavulanate] Diarrhea and Other (See Comments)    Severe Stomach cramps   Bactrim [Sulfamethoxazole-Trimethoprim] Swelling    Facial and lip swelling    Current Outpatient Medications on File Prior to Visit  Medication Sig Dispense Refill   budesonide -formoterol (SYMBICORT) 80-4.5 MCG/ACT inhaler Inhale 2 puffs into  the lungs.     buPROPion  (WELLBUTRIN  XL) 300 MG 24 hr tablet Take 300 mg by mouth daily.      cetirizine (ZYRTEC) 10 MG tablet Take 10 mg by mouth daily.      dicyclomine  (BENTYL ) 10 MG capsule Take 10 mg by mouth 2 (two) times daily as needed.     diphenhydrAMINE  (BENADRYL ) 25 MG tablet Take 25 mg by mouth every 6 (six) hours as needed for allergies.     DULoxetine  (CYMBALTA ) 60 MG capsule Take 60 mg by mouth daily.      EPINEPHrine 0.3 mg/0.3 mL IJ SOAJ injection INJECT INTO THE MIDDLE OF THE OUTER THIGH AND HOLD FOR 3 SECONDS AS NEEDED FOR SEVERE ALLERGIC REACTION THEN CALL 911 IF USED.     fexofenadine (ALLEGRA) 180 MG tablet Take 180 mg by mouth daily.      fluticasone (FLONASE) 50 MCG/ACT nasal spray Nasal     ipratropium (ATROVENT) 0.03 % nasal spray Place into both nostrils.     levothyroxine  (SYNTHROID ) 112 MCG tablet Take 1 tablet (112 mcg total) by mouth daily. 90 tablet 3   loratadine  (CLARITIN ) 10 MG tablet Take 10 mg by mouth.     montelukast  (SINGULAIR ) 10 MG tablet Take 10 mg by mouth at bedtime.     Multiple Vitamin (MULTIVITAMIN WITH MINERALS) TABS Take 1 tablet by mouth daily.      pregabalin  (LYRICA ) 50 MG capsule Take 50-100 mg by mouth 2 (two) times daily.     XIFAXAN 550 MG TABS  tablet Take 550 mg by mouth 2 (two) times daily.     No current facility-administered medications on file prior to visit.    BP 100/70   Pulse 68   Temp 98.4 F (36.9 C) (Oral)   Ht 5' 3 (1.6 m)   Wt 115 lb (52.2 kg)   SpO2 96%   BMI 20.37 kg/m       Objective:   Physical Exam Vitals and nursing note reviewed.  Constitutional:      General: She is not in acute distress.    Appearance: Normal appearance. She is not ill-appearing.  HENT:     Head: Normocephalic and atraumatic.     Right Ear: Tympanic membrane, ear canal and external ear normal. There is no impacted cerumen.     Left Ear: Tympanic membrane, ear canal and external ear normal. There is no impacted cerumen.     Nose: Nose normal. No congestion or rhinorrhea.     Mouth/Throat:     Mouth: Mucous membranes are moist.     Pharynx: Oropharynx is clear.  Eyes:     Extraocular Movements: Extraocular movements intact.     Conjunctiva/sclera: Conjunctivae normal.     Pupils: Pupils are equal, round, and reactive to light.  Neck:     Vascular: No carotid bruit.  Cardiovascular:     Rate and Rhythm: Normal rate and regular rhythm.     Pulses: Normal pulses.     Heart sounds: No murmur heard.    No friction rub. No gallop.  Pulmonary:     Effort: Pulmonary effort is normal.     Breath sounds: Normal breath sounds.  Abdominal:     General: Abdomen is flat. Bowel sounds are normal. There is no distension.     Palpations: Abdomen is soft. There is no mass.     Tenderness: There is no abdominal tenderness. There is no guarding or rebound.     Hernia: No hernia  is present.  Musculoskeletal:        General: Normal range of motion.     Cervical back: Normal range of motion and neck supple.  Lymphadenopathy:     Cervical: No cervical adenopathy.  Skin:    General: Skin is warm and dry.     Capillary Refill: Capillary refill takes less than 2 seconds.  Neurological:     General: No focal deficit present.     Mental  Status: She is alert and oriented to person, place, and time.  Psychiatric:        Mood and Affect: Mood normal.        Behavior: Behavior normal.        Thought Content: Thought content normal.        Judgment: Judgment normal.           Assessment & Plan:   1. Hypothyroidism, unspecified type - Consider doser change of synthroid  - CBC with Differential/Platelet; Future - Comprehensive metabolic panel with GFR; Future - Lipid panel; Future - TSH; Future - TSH - Lipid panel - Comprehensive metabolic panel with GFR - CBC with Differential/Platelet  2. Seasonal allergies - Per Allergy and Asthma   3. Chronic bilateral low back pain without sciatica - Per Orthopedics  4. Depression, recurrent (HCC) - Continue with Wellbutrin  but follow up with Psychiatry about depression   Darleene Shape, NP

## 2024-01-19 NOTE — Patient Instructions (Signed)
 It was great seeing you today   We will follow up with you regarding your lab work   Please let me know if you need anything

## 2024-01-20 ENCOUNTER — Other Ambulatory Visit: Payer: Self-pay | Admitting: Adult Health

## 2024-01-20 ENCOUNTER — Ambulatory Visit: Payer: Self-pay | Admitting: Adult Health

## 2024-01-20 DIAGNOSIS — E039 Hypothyroidism, unspecified: Secondary | ICD-10-CM

## 2024-01-20 MED ORDER — LEVOTHYROXINE SODIUM 125 MCG PO TABS: 125.0000 ug | ORAL_TABLET | Freq: Every day | ORAL | 0 refills | Status: DC

## 2024-01-26 ENCOUNTER — Encounter: Payer: Self-pay | Admitting: Adult Health

## 2024-01-27 DIAGNOSIS — R053 Chronic cough: Secondary | ICD-10-CM | POA: Diagnosis not present

## 2024-01-28 DIAGNOSIS — J3089 Other allergic rhinitis: Secondary | ICD-10-CM | POA: Diagnosis not present

## 2024-01-28 DIAGNOSIS — J3081 Allergic rhinitis due to animal (cat) (dog) hair and dander: Secondary | ICD-10-CM | POA: Diagnosis not present

## 2024-01-28 DIAGNOSIS — J301 Allergic rhinitis due to pollen: Secondary | ICD-10-CM | POA: Diagnosis not present

## 2024-01-28 NOTE — Addendum Note (Signed)
 Addended by: MERNA DARLEENE CROME on: 01/28/2024 06:46 AM   Modules accepted: Level of Service

## 2024-02-03 DIAGNOSIS — R053 Chronic cough: Secondary | ICD-10-CM | POA: Diagnosis not present

## 2024-02-10 DIAGNOSIS — J301 Allergic rhinitis due to pollen: Secondary | ICD-10-CM | POA: Diagnosis not present

## 2024-02-10 DIAGNOSIS — J3089 Other allergic rhinitis: Secondary | ICD-10-CM | POA: Diagnosis not present

## 2024-02-10 DIAGNOSIS — J3081 Allergic rhinitis due to animal (cat) (dog) hair and dander: Secondary | ICD-10-CM | POA: Diagnosis not present

## 2024-02-16 ENCOUNTER — Encounter: Payer: Self-pay | Admitting: Adult Health

## 2024-02-25 ENCOUNTER — Encounter: Admitting: Adult Health

## 2024-02-25 ENCOUNTER — Ambulatory Visit: Payer: Self-pay | Admitting: Adult Health

## 2024-02-25 ENCOUNTER — Other Ambulatory Visit (INDEPENDENT_AMBULATORY_CARE_PROVIDER_SITE_OTHER)

## 2024-02-25 DIAGNOSIS — E039 Hypothyroidism, unspecified: Secondary | ICD-10-CM

## 2024-02-25 LAB — TSH: TSH: 0.65 u[IU]/mL (ref 0.35–5.50)

## 2024-02-28 NOTE — Progress Notes (Signed)
 Erroneous entry

## 2024-03-09 ENCOUNTER — Encounter: Payer: Self-pay | Admitting: Adult Health

## 2024-03-09 DIAGNOSIS — E039 Hypothyroidism, unspecified: Secondary | ICD-10-CM

## 2024-03-09 MED ORDER — LEVOTHYROXINE SODIUM 125 MCG PO TABS
125.0000 ug | ORAL_TABLET | Freq: Every day | ORAL | 3 refills | Status: AC
  Filled 2024-08-05: qty 90, 90d supply, fill #0

## 2024-03-10 DIAGNOSIS — J3081 Allergic rhinitis due to animal (cat) (dog) hair and dander: Secondary | ICD-10-CM | POA: Diagnosis not present

## 2024-03-10 DIAGNOSIS — J3089 Other allergic rhinitis: Secondary | ICD-10-CM | POA: Diagnosis not present

## 2024-03-10 DIAGNOSIS — J301 Allergic rhinitis due to pollen: Secondary | ICD-10-CM | POA: Diagnosis not present

## 2024-03-10 DIAGNOSIS — H1045 Other chronic allergic conjunctivitis: Secondary | ICD-10-CM | POA: Diagnosis not present

## 2024-03-15 DIAGNOSIS — J301 Allergic rhinitis due to pollen: Secondary | ICD-10-CM | POA: Diagnosis not present

## 2024-03-15 DIAGNOSIS — J3081 Allergic rhinitis due to animal (cat) (dog) hair and dander: Secondary | ICD-10-CM | POA: Diagnosis not present

## 2024-03-15 DIAGNOSIS — J3089 Other allergic rhinitis: Secondary | ICD-10-CM | POA: Diagnosis not present

## 2024-03-18 DIAGNOSIS — J3089 Other allergic rhinitis: Secondary | ICD-10-CM | POA: Diagnosis not present

## 2024-03-18 DIAGNOSIS — J3081 Allergic rhinitis due to animal (cat) (dog) hair and dander: Secondary | ICD-10-CM | POA: Diagnosis not present

## 2024-03-18 DIAGNOSIS — J301 Allergic rhinitis due to pollen: Secondary | ICD-10-CM | POA: Diagnosis not present

## 2024-03-22 DIAGNOSIS — J3089 Other allergic rhinitis: Secondary | ICD-10-CM | POA: Diagnosis not present

## 2024-03-22 DIAGNOSIS — J3081 Allergic rhinitis due to animal (cat) (dog) hair and dander: Secondary | ICD-10-CM | POA: Diagnosis not present

## 2024-03-22 DIAGNOSIS — J301 Allergic rhinitis due to pollen: Secondary | ICD-10-CM | POA: Diagnosis not present

## 2024-03-25 DIAGNOSIS — J3081 Allergic rhinitis due to animal (cat) (dog) hair and dander: Secondary | ICD-10-CM | POA: Diagnosis not present

## 2024-03-25 DIAGNOSIS — J3089 Other allergic rhinitis: Secondary | ICD-10-CM | POA: Diagnosis not present

## 2024-03-25 DIAGNOSIS — J301 Allergic rhinitis due to pollen: Secondary | ICD-10-CM | POA: Diagnosis not present

## 2024-03-30 DIAGNOSIS — J3089 Other allergic rhinitis: Secondary | ICD-10-CM | POA: Diagnosis not present

## 2024-03-30 DIAGNOSIS — J301 Allergic rhinitis due to pollen: Secondary | ICD-10-CM | POA: Diagnosis not present

## 2024-03-30 DIAGNOSIS — J3081 Allergic rhinitis due to animal (cat) (dog) hair and dander: Secondary | ICD-10-CM | POA: Diagnosis not present

## 2024-04-06 DIAGNOSIS — J3081 Allergic rhinitis due to animal (cat) (dog) hair and dander: Secondary | ICD-10-CM | POA: Diagnosis not present

## 2024-04-06 DIAGNOSIS — J3089 Other allergic rhinitis: Secondary | ICD-10-CM | POA: Diagnosis not present

## 2024-04-06 DIAGNOSIS — J301 Allergic rhinitis due to pollen: Secondary | ICD-10-CM | POA: Diagnosis not present

## 2024-04-13 DIAGNOSIS — J3089 Other allergic rhinitis: Secondary | ICD-10-CM | POA: Diagnosis not present

## 2024-04-13 DIAGNOSIS — J301 Allergic rhinitis due to pollen: Secondary | ICD-10-CM | POA: Diagnosis not present

## 2024-04-13 DIAGNOSIS — J3081 Allergic rhinitis due to animal (cat) (dog) hair and dander: Secondary | ICD-10-CM | POA: Diagnosis not present

## 2024-04-24 DIAGNOSIS — J449 Chronic obstructive pulmonary disease, unspecified: Secondary | ICD-10-CM | POA: Diagnosis not present

## 2024-04-24 DIAGNOSIS — J22 Unspecified acute lower respiratory infection: Secondary | ICD-10-CM | POA: Diagnosis not present

## 2024-04-24 DIAGNOSIS — J45901 Unspecified asthma with (acute) exacerbation: Secondary | ICD-10-CM | POA: Diagnosis not present

## 2024-05-04 DIAGNOSIS — J301 Allergic rhinitis due to pollen: Secondary | ICD-10-CM | POA: Diagnosis not present

## 2024-05-04 DIAGNOSIS — J3081 Allergic rhinitis due to animal (cat) (dog) hair and dander: Secondary | ICD-10-CM | POA: Diagnosis not present

## 2024-05-04 DIAGNOSIS — J3089 Other allergic rhinitis: Secondary | ICD-10-CM | POA: Diagnosis not present

## 2024-05-12 DIAGNOSIS — J3089 Other allergic rhinitis: Secondary | ICD-10-CM | POA: Diagnosis not present

## 2024-05-12 DIAGNOSIS — J301 Allergic rhinitis due to pollen: Secondary | ICD-10-CM | POA: Diagnosis not present

## 2024-05-12 DIAGNOSIS — J3081 Allergic rhinitis due to animal (cat) (dog) hair and dander: Secondary | ICD-10-CM | POA: Diagnosis not present

## 2024-05-18 DIAGNOSIS — J3089 Other allergic rhinitis: Secondary | ICD-10-CM | POA: Diagnosis not present

## 2024-05-18 DIAGNOSIS — J301 Allergic rhinitis due to pollen: Secondary | ICD-10-CM | POA: Diagnosis not present

## 2024-05-18 DIAGNOSIS — J3081 Allergic rhinitis due to animal (cat) (dog) hair and dander: Secondary | ICD-10-CM | POA: Diagnosis not present

## 2024-05-24 ENCOUNTER — Ambulatory Visit (INDEPENDENT_AMBULATORY_CARE_PROVIDER_SITE_OTHER): Admitting: Adult Health

## 2024-05-24 ENCOUNTER — Encounter: Payer: Self-pay | Admitting: Adult Health

## 2024-05-24 ENCOUNTER — Ambulatory Visit

## 2024-05-24 VITALS — BP 116/60 | HR 67 | Temp 97.6°F | Ht 63.0 in | Wt 115.4 lb

## 2024-05-24 DIAGNOSIS — M25562 Pain in left knee: Secondary | ICD-10-CM

## 2024-05-24 NOTE — Progress Notes (Signed)
 Subjective:    Patient ID: Jennifer Deleon, female    DOB: April 25, 1946, 78 y.o.   MRN: [id number]  HPI Discussed the use of AI scribe software for clinical note transcription with the patient, who gave verbal consent to proceed.  History of Present Illness   Jennifer Deleon is a 78 year old female who presents with left knee pain.  She experienced left knee pain after tripping over a hose, initially resulting in a scrape but no significant pain. Recently, she felt a sharp, ice-pick-like pain in a specific spot on her left knee while standing and moving. The pain is sharp and localized, without swelling or redness.  The pain occurs when kneeling, which she mitigates by using a kneeling pad. It is not constant and does not occur during sleep or when the knee is extended. Walking is generally fine, though she occasionally feels a slight twinge. The pain is most noticeable when she touches the area.   She has not sustained any new injuries and has not taken any anti-inflammatories. She avoids pain medication unless necessary. The intensity of the pain at times is concerning to her.        Review of Systems See HPI   Past Medical History:  Diagnosis Date   Arthritis    Asthma    Chicken pox    Colon polyps    Depression    Environmental allergies    allergies all year long   Hypothyroid    Osteoporosis    UTI (lower urinary tract infection)     Social History   Socioeconomic History   Marital status: Married    Spouse name: Not on file   Number of children: 2   Years of education: Not on file   Highest education level: Bachelor's degree (e.g., BA, AB, BS)  Occupational History   Occupation: retired  Tobacco Use   Smoking status: Never   Smokeless tobacco: Never  Vaping Use   Vaping status: Never Used  Substance and Sexual Activity   Alcohol use: Yes    Alcohol/week: 7.0 standard drinks of alcohol    Types: 7 Glasses of wine per week    Comment: a glass of wine with  dinner each night   Drug use: No   Sexual activity: Not on file  Other Topics Concern   Not on file  Social History Narrative   Retired from Museum/gallery Exhibitions Officer    Married for  11 years    Children who live in [place]       Social Drivers of Health   Financial Resource Strain: Low Risk  (05/22/2024)   Overall Financial Resource Strain (CARDIA)    Difficulty of Paying Living Expenses: Not hard at all  Food Insecurity: No Food Insecurity (05/22/2024)   Hunger Vital Sign    Worried About Running Out of Food in the Last Year: Never true    Ran Out of Food in the Last Year: Never true  Transportation Needs: No Transportation Needs (05/22/2024)   PRAPARE - Administrator, Civil Service (Medical): No    Lack of Transportation (Non-Medical): No  Physical Activity: Sufficiently Active (05/22/2024)   Exercise Vital Sign    Days of Exercise per Week: 5 days    Minutes of Exercise per Session: 30 min  Stress: No Stress Concern Present (05/22/2024)   [employer] of Occupational Health - Occupational Stress Questionnaire    Feeling of Stress: Only a little  Social Connections: Moderately Integrated (  05/22/2024)   Social Connection and Isolation Panel    Frequency of Communication with Friends and Family: Once a week    Frequency of Social Gatherings with Friends and Family: Once a week    Attends Religious Services: 1 to 4 times per year    Active Member of [employer] or Organizations: Yes    Attends Banker Meetings: Not on file    Marital Status: Married  Intimate Partner Violence: Not At Risk (03/05/2021)   Humiliation, Afraid, Rape, and Kick questionnaire    Fear of Current or Ex-Partner: No    Emotionally Abused: No    Physically Abused: No    Sexually Abused: No    Past Surgical History:  Procedure Laterality Date   CATARACT EXTRACTION Bilateral 2016   LAMINECTOMY     LUMBAR FUSION  2015   l4-L5   TUBAL LIGATION      Family History  Problem  Relation Age of Onset   Osteoarthritis Mother    Osteoarthritis Father    Hypertension Father    Heart failure Father    Osteoarthritis Maternal Grandmother    Prostate cancer Paternal Grandfather        mets to colon   Colon cancer Paternal Grandfather    Arthritis Other    Prostate cancer Other    Mental illness Other    Breast cancer Paternal Aunt     Allergies  Allergen Reactions   Augmentin [Amoxicillin-Pot Clavulanate] Diarrhea and Other (See Comments)    Severe Stomach cramps   Bactrim [Sulfamethoxazole-Trimethoprim] Swelling    Facial and lip swelling    Current Outpatient Medications on File Prior to Visit  Medication Sig Dispense Refill   budesonide -formoterol (SYMBICORT) 80-4.5 MCG/ACT inhaler Inhale 2 puffs into the lungs.     buPROPion  (WELLBUTRIN  XL) 300 MG 24 hr tablet Take 300 mg by mouth daily.      cetirizine (ZYRTEC) 10 MG tablet Take 10 mg by mouth daily.      dicyclomine  (BENTYL ) 10 MG capsule Take 10 mg by mouth 2 (two) times daily as needed.     diphenhydrAMINE  (BENADRYL ) 25 MG tablet Take 25 mg by mouth every 6 (six) hours as needed for allergies.     DULoxetine  (CYMBALTA ) 60 MG capsule Take 60 mg by mouth daily.      EPINEPHrine 0.3 mg/0.3 mL IJ SOAJ injection INJECT INTO THE MIDDLE OF THE OUTER THIGH AND HOLD FOR 3 SECONDS AS NEEDED FOR SEVERE ALLERGIC REACTION THEN CALL 911 IF USED.     fexofenadine (ALLEGRA) 180 MG tablet Take 180 mg by mouth daily.      fluticasone (FLONASE) 50 MCG/ACT nasal spray Nasal     ipratropium (ATROVENT) 0.03 % nasal spray Place into both nostrils.     levothyroxine  (SYNTHROID ) 125 MCG tablet Take 1 tablet (125 mcg total) by mouth daily. 90 tablet 3   loratadine  (CLARITIN ) 10 MG tablet Take 10 mg by mouth.     montelukast  (SINGULAIR ) 10 MG tablet Take 10 mg by mouth at bedtime.     Multiple Vitamin (MULTIVITAMIN WITH MINERALS) TABS Take 1 tablet by mouth daily.      pregabalin  (LYRICA ) 50 MG capsule Take 50-100 mg by mouth  2 (two) times daily.     XIFAXAN 550 MG TABS tablet Take 550 mg by mouth 2 (two) times daily.     No current facility-administered medications on file prior to visit.    BP 116/60   Pulse 67   Temp  97.6 F (36.4 C)   Ht 5' 3 (1.6 m)   Wt 115 lb 6.4 oz (52.3 kg)   SpO2 97%   BMI 20.44 kg/m       Objective:   Physical Exam Vitals and nursing note reviewed.  Constitutional:      Appearance: Normal appearance.  Musculoskeletal:     Left knee: Bony tenderness present. No swelling, deformity, erythema or crepitus. Normal range of motion. No tenderness. No LCL laxity, MCL laxity, ACL laxity or PCL laxity.Normal alignment, normal meniscus and normal patellar mobility. Normal pulse.     Instability Tests: Anterior drawer test negative. Posterior drawer test negative.       Legs:  Neurological:     General: No focal deficit present.     Mental Status: She is alert and oriented to person, place, and time.  Psychiatric:        Mood and Affect: Mood normal.        Behavior: Behavior normal.        Thought Content: Thought content normal.        Judgment: Judgment normal.       Assessment & Plan:  Assessment and Plan    Acute Left knee pain Intermittent sharp pain, localized, exacerbated by kneeling and palpation. She was quite tender with light palpation. - Ordered x-ray of the left knee. - Advised icing the knee. - Recommended trial of anti-inflammatory medications such as Motrin  or Aleve for 1-2 days. - Will follow up with x-ray results in a couple of days and consider advanced imaging       I personally spent a total of 31 minutes in the care of the patient today including preparing to see the patient, getting/reviewing separately obtained history, performing a medically appropriate exam/evaluation, counseling and educating, placing orders, and documenting clinical information in the EHR.

## 2024-05-27 DIAGNOSIS — J301 Allergic rhinitis due to pollen: Secondary | ICD-10-CM | POA: Diagnosis not present

## 2024-05-27 DIAGNOSIS — J3081 Allergic rhinitis due to animal (cat) (dog) hair and dander: Secondary | ICD-10-CM | POA: Diagnosis not present

## 2024-05-27 DIAGNOSIS — J3089 Other allergic rhinitis: Secondary | ICD-10-CM | POA: Diagnosis not present

## 2024-05-31 ENCOUNTER — Ambulatory Visit: Payer: Self-pay | Admitting: Adult Health

## 2024-06-07 DIAGNOSIS — J3089 Other allergic rhinitis: Secondary | ICD-10-CM | POA: Diagnosis not present

## 2024-06-07 DIAGNOSIS — J3081 Allergic rhinitis due to animal (cat) (dog) hair and dander: Secondary | ICD-10-CM | POA: Diagnosis not present

## 2024-06-07 DIAGNOSIS — J301 Allergic rhinitis due to pollen: Secondary | ICD-10-CM | POA: Diagnosis not present

## 2024-06-08 ENCOUNTER — Other Ambulatory Visit: Payer: Self-pay | Admitting: Adult Health

## 2024-06-08 DIAGNOSIS — M25562 Pain in left knee: Secondary | ICD-10-CM

## 2024-06-15 DIAGNOSIS — J301 Allergic rhinitis due to pollen: Secondary | ICD-10-CM | POA: Diagnosis not present

## 2024-06-15 DIAGNOSIS — J3081 Allergic rhinitis due to animal (cat) (dog) hair and dander: Secondary | ICD-10-CM | POA: Diagnosis not present

## 2024-06-15 DIAGNOSIS — J3089 Other allergic rhinitis: Secondary | ICD-10-CM | POA: Diagnosis not present

## 2024-06-17 DIAGNOSIS — J301 Allergic rhinitis due to pollen: Secondary | ICD-10-CM | POA: Diagnosis not present

## 2024-06-17 DIAGNOSIS — J3089 Other allergic rhinitis: Secondary | ICD-10-CM | POA: Diagnosis not present

## 2024-06-17 DIAGNOSIS — J3081 Allergic rhinitis due to animal (cat) (dog) hair and dander: Secondary | ICD-10-CM | POA: Diagnosis not present

## 2024-06-23 ENCOUNTER — Inpatient Hospital Stay: Admission: RE | Admit: 2024-06-23 | Discharge: 2024-06-23 | Attending: Adult Health

## 2024-06-23 DIAGNOSIS — M25562 Pain in left knee: Secondary | ICD-10-CM

## 2024-07-04 ENCOUNTER — Encounter: Payer: Self-pay | Admitting: Adult Health

## 2024-07-05 ENCOUNTER — Ambulatory Visit: Payer: Self-pay | Admitting: Adult Health

## 2024-07-05 ENCOUNTER — Other Ambulatory Visit: Payer: Self-pay | Admitting: Adult Health

## 2024-07-05 DIAGNOSIS — M25562 Pain in left knee: Secondary | ICD-10-CM

## 2024-07-12 ENCOUNTER — Ambulatory Visit (INDEPENDENT_AMBULATORY_CARE_PROVIDER_SITE_OTHER): Admitting: Physician Assistant

## 2024-07-12 DIAGNOSIS — M25562 Pain in left knee: Secondary | ICD-10-CM

## 2024-07-12 NOTE — Progress Notes (Signed)
 "  Office Visit Note   Patient: Jennifer Deleon           Date of Birth: Jun 21, 1946           MRN: [id number] Visit Date: 07/12/2024              Requested by: Merna Huxley, NP [address] [place],  [place] [address] PCP: Merna Huxley, NP   Assessment & Plan: Visit Diagnoses:  1. Left knee pain, unspecified chronicity     Plan: Impression is left knee medial meniscal root tear.  I have discussed referral to Dr. Jerri for surgical consultation.  Call with concerns or questions.  Follow-Up Instructions: Return for f/u with xu.   Orders:  No orders of the defined types were placed in this encounter.  No orders of the defined types were placed in this encounter.     Procedures: No procedures performed   Clinical Data: No additional findings.   Subjective: Chief Complaint  Patient presents with   Left Knee - Pain    HPI patient is a pleasant 79 year old female who comes in today with concerns about her left knee.  She is an avid gardener and fell on her knee about 3 to 4 months ago.  She has had pain to the anteromedial knee since.  Recent MRI of the left knee was obtained which showed posterior horn medial meniscus tear extending to the root.  The pain she has is primarily only occurring when she applies pressure to the area.  She denies mechanical symptoms or swelling.  No instability.  No previous cortisone injection.  Review of Systems as detailed in HPI.  All others reviewed and are negative.   Objective: Vital Signs: There were no vitals taken for this visit.  Physical Exam well-developed well-nourished female in no acute distress.  Alert and oriented x 3.  Ortho Exam left knee exam: No effusion.  Range of motion 0 to 125 degrees.  She has tenderness along the medial patellar facet but no joint line tenderness.  She is neurovascular intact distally.  Specialty Comments:  No specialty comments available.  Imaging: No new imaging   PMFS  History: Patient Active Problem List   Diagnosis Date Noted   Right lower quadrant abdominal pain 06/13/2022   Lip laceration 07/01/2019   Closed fracture of right condylar process of mandible (HCC) 07/01/2019   Mandibular fracture, closed (HCC) 07/01/2019   Syncope 06/30/2019   Pes anserine bursitis 03/01/2018   Rib fractures 08/11/2015   Multiple rib fractures 08/11/2015   Chest wall pain    Rib fracture    Tinea corporis 06/13/2015   Hypothyroidism 04/05/2015   Past Medical History:  Diagnosis Date   Arthritis    Asthma    Chicken pox    Colon polyps    Depression    Environmental allergies    allergies all year long   Hypothyroid    Osteoporosis    UTI (lower urinary tract infection)     Family History  Problem Relation Age of Onset   Osteoarthritis Mother    Osteoarthritis Father    Hypertension Father    Heart failure Father    Osteoarthritis Maternal Grandmother    Prostate cancer Paternal Grandfather        mets to colon   Colon cancer Paternal Grandfather    Arthritis Other    Prostate cancer Other    Mental illness Other    Breast cancer Paternal Aunt  Past Surgical History:  Procedure Laterality Date   CATARACT EXTRACTION Bilateral 2016   LAMINECTOMY     LUMBAR FUSION  2015   l4-L5   TUBAL LIGATION     Social History   Occupational History   Occupation: retired  Tobacco Use   Smoking status: Never   Smokeless tobacco: Never  Vaping Use   Vaping status: Never Used  Substance and Sexual Activity   Alcohol use: Yes    Alcohol/week: 7.0 standard drinks of alcohol    Types: 7 Glasses of wine per week    Comment: a glass of wine with dinner each night   Drug use: No   Sexual activity: Not on file        "

## 2024-07-26 ENCOUNTER — Ambulatory Visit: Admitting: Orthopaedic Surgery

## 2024-07-26 DIAGNOSIS — G8929 Other chronic pain: Secondary | ICD-10-CM | POA: Diagnosis not present

## 2024-07-26 DIAGNOSIS — M25562 Pain in left knee: Secondary | ICD-10-CM

## 2024-07-26 NOTE — Progress Notes (Addendum)
 "  Office Visit Note   Patient: Jennifer Deleon           Date of Birth: [birth year]           MRN: [id number] Visit Date: 07/26/2024              Requested by: Merna Huxley, NP [address] [place],  [place] [address] PCP: Merna Huxley, NP   Assessment & Plan: Visit Diagnoses:  1. Chronic pain of left knee     Plan: Impression is symptomatic traumatic prepatellar bursitis.  MRI shows trace effusion of the bursa.  She is not symptomatic from the medial meniscal tear.  This is likely incidental finding.  Recommend Voltaren  gel as initial treatment.  Likely kneepads or cushioned kneeling pad when she is gardening.  Follow-up as needed.  Follow-Up Instructions: Return if symptoms worsen or fail to improve.   Orders:  No orders of the defined types were placed in this encounter.  No orders of the defined types were placed in this encounter.     Procedures: No procedures performed   Clinical Data: No additional findings.   Subjective: Chief Complaint  Patient presents with   Left Knee - Pain    HPI Patient is a 79-year-old female here for further evaluation of left knee pain.  Had a mechanical fall directly onto the anterior knee over the summer.  Due to ongoing pain MRI was obtained and she is here for MRI review.  Desires any mechanical symptoms.  Reports pain over the anterior aspect of the knee that is worse with kneeling. Review of Systems  Constitutional: Negative.   HENT: Negative.    Eyes: Negative.   Respiratory: Negative.    Cardiovascular: Negative.   Endocrine: Negative.   Musculoskeletal: Negative.   Neurological: Negative.   Hematological: Negative.   Psychiatric/Behavioral: Negative.    All other systems reviewed and are negative.    Objective: Vital Signs: There were no vitals taken for this visit.  Physical Exam Vitals and nursing note reviewed.  Constitutional:      Appearance: She is well-developed.  HENT:     Head: Atraumatic.      Nose: Nose normal.  Eyes:     Extraocular Movements: Extraocular movements intact.  Cardiovascular:     Pulses: Normal pulses.  Pulmonary:     Effort: Pulmonary effort is normal.  Abdominal:     Palpations: Abdomen is soft.  Musculoskeletal:     Cervical back: Neck supple.  Skin:    General: Skin is warm.     Capillary Refill: Capillary refill takes less than 2 seconds.  Neurological:     Mental Status: She is alert. Mental status is at baseline.  Psychiatric:        Behavior: Behavior normal.        Thought Content: Thought content normal.        Judgment: Judgment normal.     Ortho Exam Examination shows tenderness over the anterior aspect of the patella and patella tendon.  There is no fluctuance of the bursa.  Skin is intact.  Extensor function intact.  No joint line tenderness. Specialty Comments:  No specialty comments available.  Imaging: No results found.   PMFS History: Patient Active Problem List   Diagnosis Date Noted   Chronic pain of left knee 07/26/2024   Right lower quadrant abdominal pain 06/13/2022   Lip laceration 07/01/2019   Closed fracture of right condylar process of mandible (HCC) 07/01/2019  Mandibular fracture, closed (HCC) 07/01/2019   Syncope 06/30/2019   Pes anserine bursitis 03/01/2018   Rib fractures 08/11/2015   Multiple rib fractures 08/11/2015   Chest wall pain    Rib fracture    Tinea corporis 06/13/2015   Hypothyroidism 04/05/2015   Past Medical History:  Diagnosis Date   Arthritis    Asthma    Chicken pox    Colon polyps    Depression    Environmental allergies    allergies all year long   Hypothyroid    Osteoporosis    UTI (lower urinary tract infection)     Family History  Problem Relation Age of Onset   Osteoarthritis Mother    Osteoarthritis Father    Hypertension Father    Heart failure Father    Osteoarthritis Maternal Grandmother    Prostate cancer Paternal Grandfather        mets to colon   Colon  cancer Paternal Grandfather    Arthritis Other    Prostate cancer Other    Mental illness Other    Breast cancer Paternal Aunt     Past Surgical History:  Procedure Laterality Date   CATARACT EXTRACTION Bilateral 2016   LAMINECTOMY     LUMBAR FUSION  2015   l4-L5   TUBAL LIGATION     Social History   Occupational History   Occupation: retired  Tobacco Use   Smoking status: Never   Smokeless tobacco: Never  Vaping Use   Vaping status: Never Used  Substance and Sexual Activity   Alcohol use: Yes    Alcohol/week: 7.0 standard drinks of alcohol    Types: 7 Glasses of wine per week    Comment: a glass of wine with dinner each night   Drug use: No   Sexual activity: Not on file        "

## 2024-07-29 ENCOUNTER — Other Ambulatory Visit (HOSPITAL_COMMUNITY): Payer: Self-pay

## 2024-08-01 ENCOUNTER — Other Ambulatory Visit (HOSPITAL_COMMUNITY): Payer: Self-pay

## 2024-08-05 ENCOUNTER — Other Ambulatory Visit (HOSPITAL_COMMUNITY): Payer: Self-pay

## 2024-08-05 ENCOUNTER — Other Ambulatory Visit: Payer: Self-pay

## 2024-08-05 MED ORDER — BUPROPION HCL ER (XL) 150 MG PO TB24
150.0000 mg | ORAL_TABLET | Freq: Every morning | ORAL | 3 refills | Status: AC
  Filled 2024-08-05: qty 90, 90d supply, fill #0

## 2024-08-05 MED ORDER — MONTELUKAST SODIUM 10 MG PO TABS
10.0000 mg | ORAL_TABLET | Freq: Every evening | ORAL | 3 refills | Status: AC
  Filled 2024-08-05: qty 90, 90d supply, fill #0

## 2024-08-05 MED ORDER — IPRATROPIUM BROMIDE 0.03 % NA SOLN: 2.0000 | Freq: Three times a day (TID) | NASAL | 0 refills | Status: AC

## 2024-08-05 MED ORDER — DULOXETINE HCL 60 MG PO CPEP
60.0000 mg | ORAL_CAPSULE | Freq: Every day | ORAL | 3 refills | Status: AC
  Filled 2024-08-05: qty 90, 90d supply, fill #0
